# Patient Record
Sex: Male | Born: 1980 | Hispanic: No | Marital: Single | Smoking: Current every day smoker
Health system: Southern US, Community
[De-identification: ages and names within clinical notes are randomized; demographics above are authoritative.]

## PROBLEM LIST (undated history)

## (undated) DIAGNOSIS — K922 Gastrointestinal hemorrhage, unspecified: Secondary | ICD-10-CM

## (undated) DIAGNOSIS — G629 Polyneuropathy, unspecified: Secondary | ICD-10-CM

## (undated) HISTORY — PX: TONSILLECTOMY: SUR1361

---

## 2006-08-14 ENCOUNTER — Ambulatory Visit: Payer: Self-pay | Admitting: Psychiatry

## 2006-08-14 ENCOUNTER — Inpatient Hospital Stay (HOSPITAL_COMMUNITY): Admission: AD | Admit: 2006-08-14 | Discharge: 2006-08-21 | Payer: Self-pay | Admitting: Psychiatry

## 2015-06-17 ENCOUNTER — Emergency Department (HOSPITAL_COMMUNITY)
Admission: EM | Admit: 2015-06-17 | Discharge: 2015-06-17 | Discharge status: Against Medical Advice | Payer: No Typology Code available for payment source | Attending: Emergency Medicine | Admitting: Emergency Medicine

## 2015-06-17 DIAGNOSIS — F17210 Nicotine dependence, cigarettes, uncomplicated: Secondary | ICD-10-CM | POA: Insufficient documentation

## 2015-06-17 DIAGNOSIS — M549 Dorsalgia, unspecified: Secondary | ICD-10-CM | POA: Insufficient documentation

## 2015-06-20 ENCOUNTER — Emergency Department (HOSPITAL_COMMUNITY): Payer: BLUE CROSS/BLUE SHIELD

## 2015-06-20 ENCOUNTER — Emergency Department (HOSPITAL_COMMUNITY)
Admission: EM | Admit: 2015-06-20 | Discharge: 2015-06-20 | Discharge status: Against Medical Advice | Payer: No Typology Code available for payment source

## 2015-06-20 ENCOUNTER — Emergency Department (HOSPITAL_COMMUNITY)
Admission: EM | Admit: 2015-06-20 | Discharge: 2015-06-20 | Disposition: A | Discharge status: Home / Self Care | Payer: BLUE CROSS/BLUE SHIELD | Attending: Emergency Medicine | Admitting: Emergency Medicine

## 2015-06-20 ENCOUNTER — Encounter (HOSPITAL_COMMUNITY): Payer: Self-pay | Admitting: *Deleted

## 2015-06-20 DIAGNOSIS — F17210 Nicotine dependence, cigarettes, uncomplicated: Secondary | ICD-10-CM | POA: Diagnosis not present

## 2015-06-20 DIAGNOSIS — S3992XA Unspecified injury of lower back, initial encounter: Secondary | ICD-10-CM | POA: Diagnosis present

## 2015-06-20 DIAGNOSIS — R112 Nausea with vomiting, unspecified: Secondary | ICD-10-CM

## 2015-06-20 DIAGNOSIS — Z8719 Personal history of other diseases of the digestive system: Secondary | ICD-10-CM | POA: Insufficient documentation

## 2015-06-20 DIAGNOSIS — Y92410 Unspecified street and highway as the place of occurrence of the external cause: Secondary | ICD-10-CM | POA: Insufficient documentation

## 2015-06-20 DIAGNOSIS — S20229A Contusion of unspecified back wall of thorax, initial encounter: Secondary | ICD-10-CM | POA: Diagnosis not present

## 2015-06-20 DIAGNOSIS — Y9389 Activity, other specified: Secondary | ICD-10-CM | POA: Diagnosis not present

## 2015-06-20 DIAGNOSIS — Z8669 Personal history of other diseases of the nervous system and sense organs: Secondary | ICD-10-CM | POA: Insufficient documentation

## 2015-06-20 DIAGNOSIS — S0990XA Unspecified injury of head, initial encounter: Secondary | ICD-10-CM | POA: Insufficient documentation

## 2015-06-20 DIAGNOSIS — R197 Diarrhea, unspecified: Secondary | ICD-10-CM

## 2015-06-20 DIAGNOSIS — S20221A Contusion of right back wall of thorax, initial encounter: Secondary | ICD-10-CM | POA: Insufficient documentation

## 2015-06-20 DIAGNOSIS — Z791 Long term (current) use of non-steroidal anti-inflammatories (NSAID): Secondary | ICD-10-CM | POA: Insufficient documentation

## 2015-06-20 DIAGNOSIS — Y998 Other external cause status: Secondary | ICD-10-CM | POA: Diagnosis not present

## 2015-06-20 HISTORY — DX: Polyneuropathy, unspecified: G62.9

## 2015-06-20 HISTORY — DX: Gastrointestinal hemorrhage, unspecified: K92.2

## 2015-06-20 LAB — BASIC METABOLIC PANEL
ANION GAP: 10 (ref 5–15)
BUN: 9 mg/dL (ref 6–20)
CHLORIDE: 100 mmol/L — AB (ref 101–111)
CO2: 25 mmol/L (ref 22–32)
CREATININE: 0.8 mg/dL (ref 0.61–1.24)
Calcium: 9.3 mg/dL (ref 8.9–10.3)
GFR calc non Af Amer: >60 mL/min (ref >60)
Glucose, Bld: 99 mg/dL (ref 65–99)
POTASSIUM: 3.7 mmol/L (ref 3.5–5.1)
SODIUM: 135 mmol/L (ref 135–145)

## 2015-06-20 LAB — URINALYSIS, ROUTINE W REFLEX MICROSCOPIC
BILIRUBIN URINE: NEGATIVE
Glucose, UA: NEGATIVE mg/dL
HGB URINE DIPSTICK: NEGATIVE
KETONES UR: NEGATIVE mg/dL
Leukocytes, UA: NEGATIVE
NITRITE: NEGATIVE
PROTEIN: NEGATIVE mg/dL
Specific Gravity, Urine: 1.02 (ref 1.005–1.030)
pH: 6.5 (ref 5.0–8.0)

## 2015-06-20 IMAGING — CR DG LUMBAR SPINE COMPLETE 4+V
5 series · 5 of 5 positions shown · non-contrast
Comparison: None.

CLINICAL DATA: Initial evaluation for right-sided lower back pain
for 1 week.

EXAM:
LUMBAR SPINE - COMPLETE 4+ VIEW

[l-spine ap]
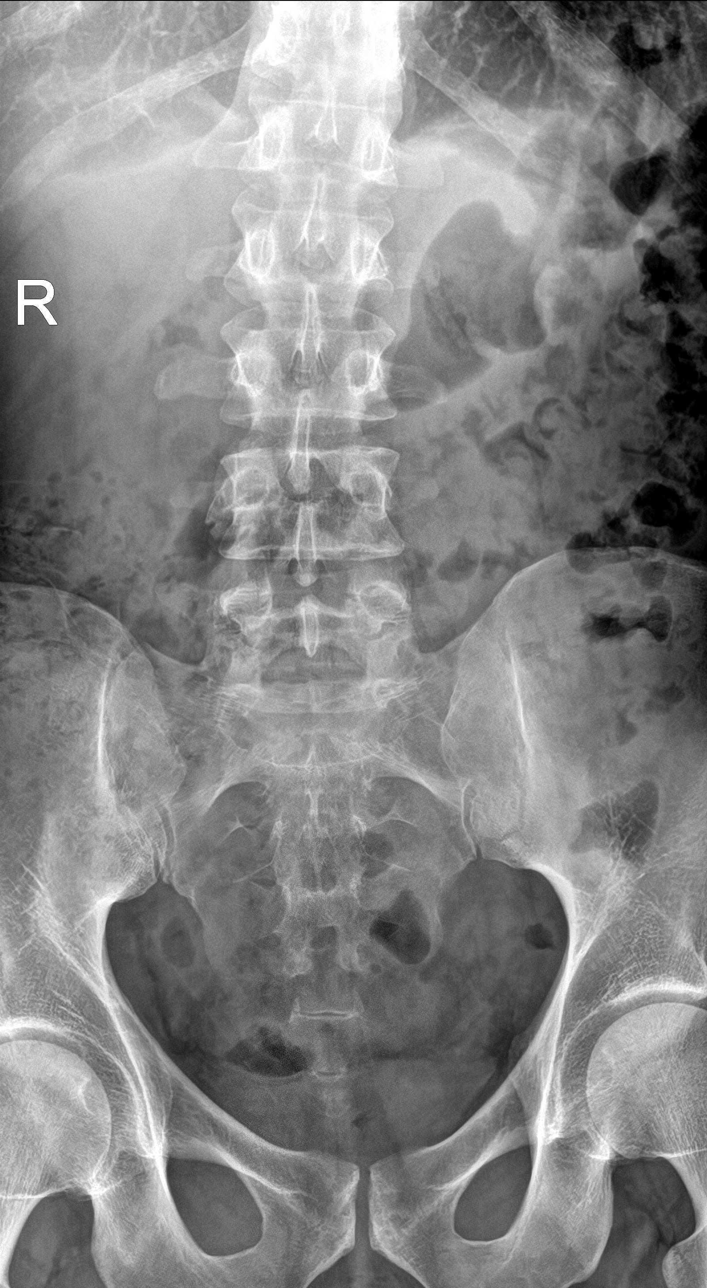

[l-spine obl (1 of 2)]
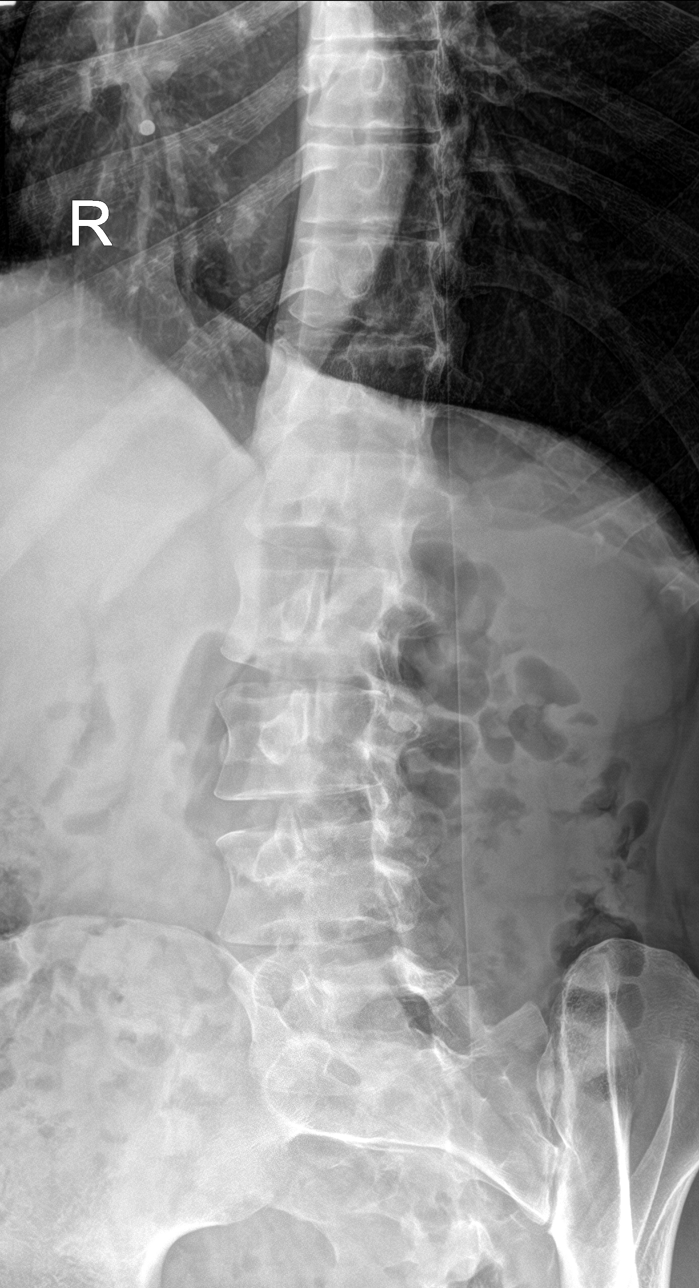

[l-spine obl (2 of 2)]
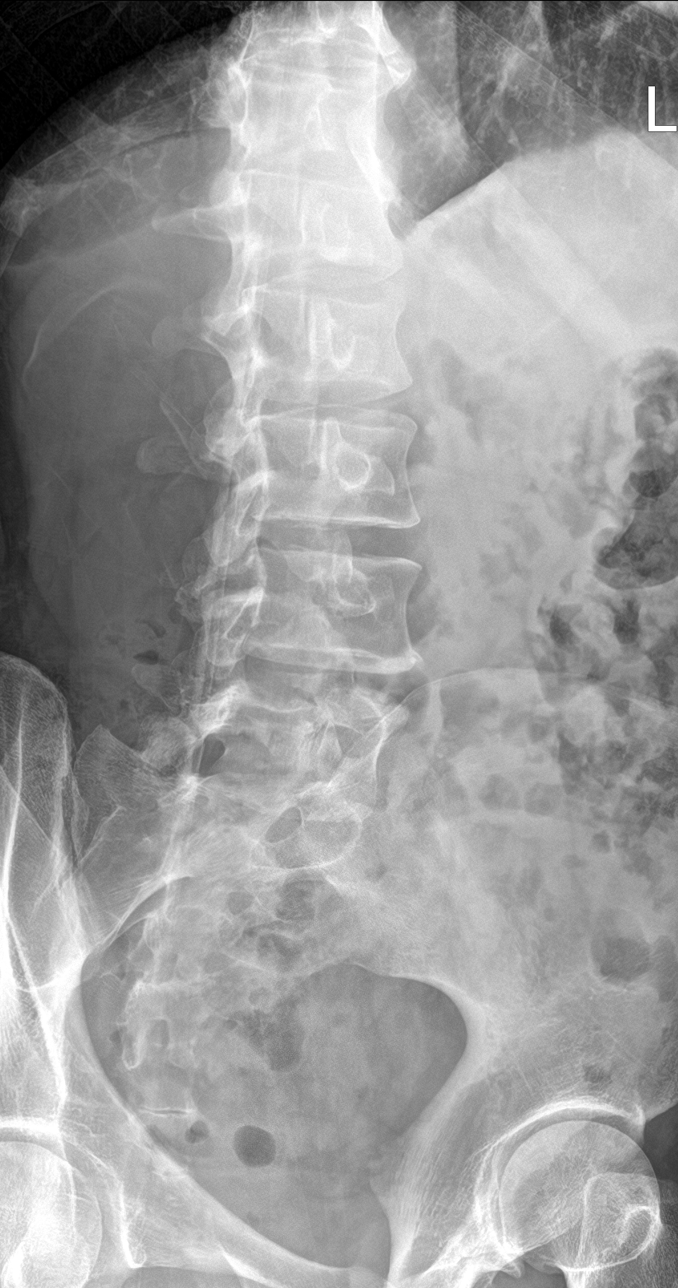

[l-spine lat]
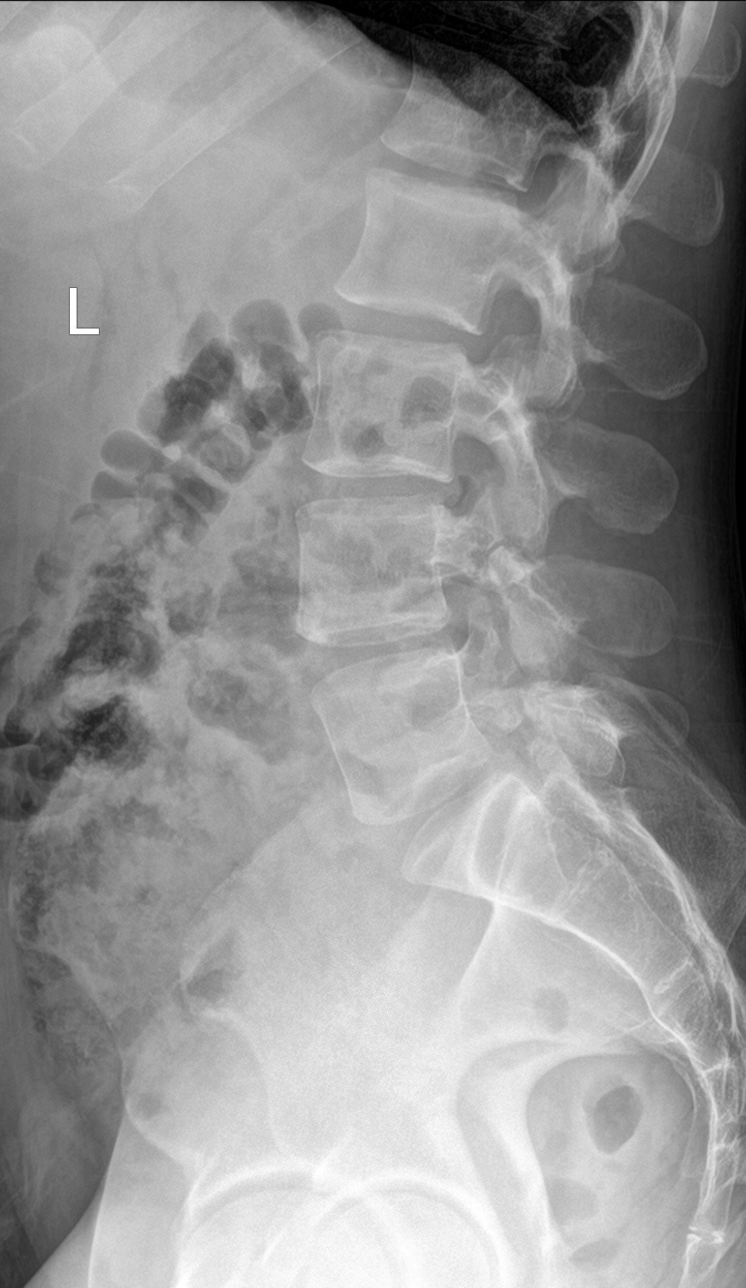

[l-spine spot]
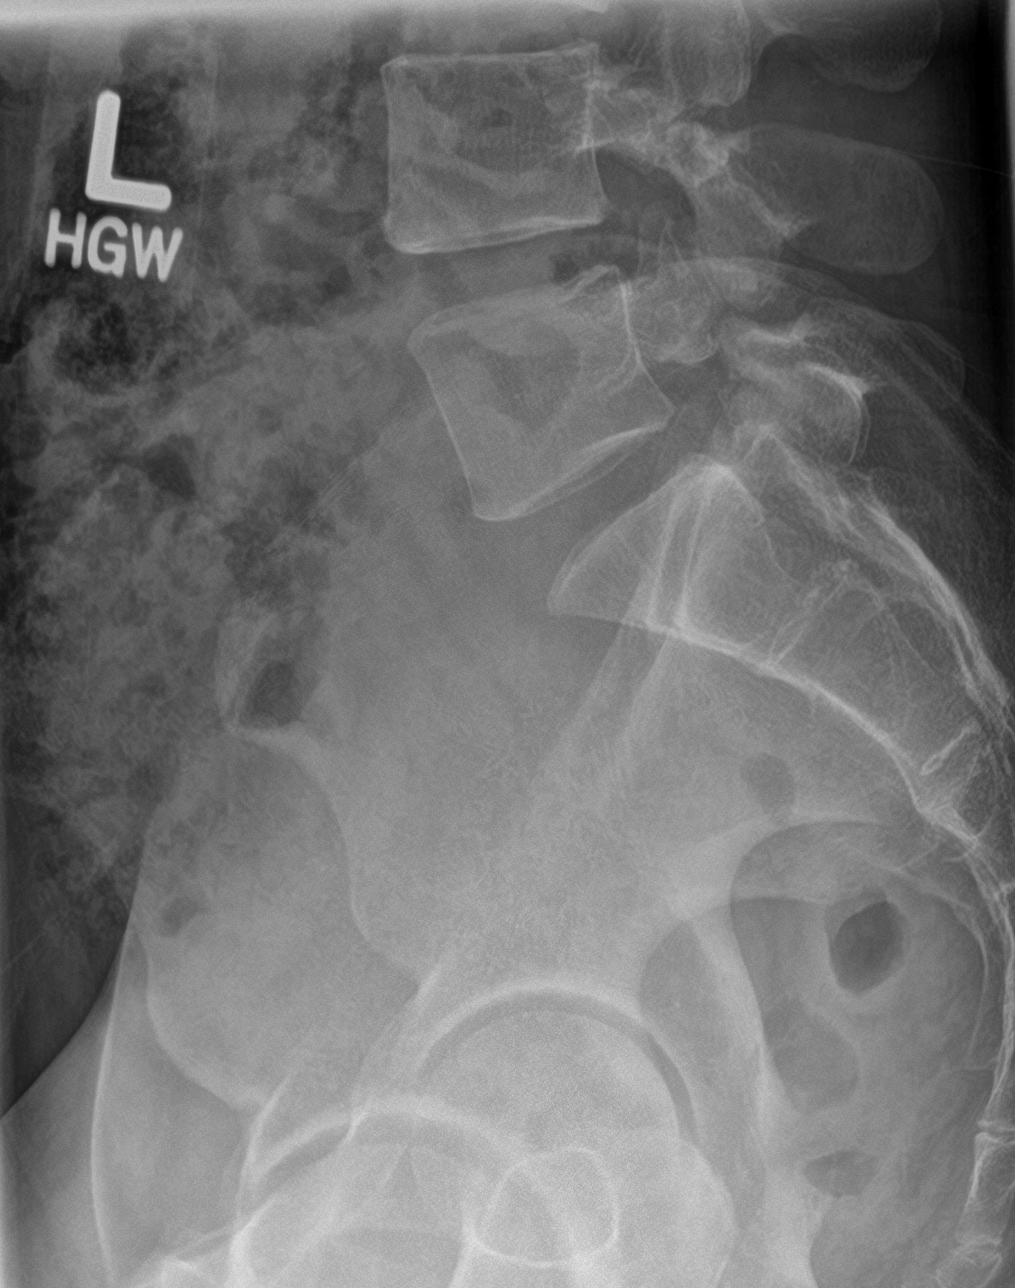

[5 of 5 positions shown; findings below may reference images not displayed]

FINDINGS: Mild dextroscoliosis. Vertebral bodies otherwise normally aligned
with preservation of the normal lumbar lordosis. There are chronic
bilateral pars defects at L5 as well as L4. No spondylolisthesis.
Minimal degenerative endplate changes at L3-4 through L5-S1. No
fracture. SI joints approximated. Soft tissues are normal.
IMPRESSION: 1. No acute abnormality within the lumbar spine.
2. Chronic bilateral pars defects at L4 and L5 without
spondylolisthesis.
3. Mild degenerative endplate changes at L3-4 through L5-S1.

## 2015-06-20 MED ORDER — ONDANSETRON HCL 4 MG PO TABS: 4.0000 mg | ORAL_TABLET | Freq: Four times a day (QID) | ORAL | Status: DC

## 2015-06-20 MED ORDER — CYCLOBENZAPRINE HCL 10 MG PO TABS: 10.0000 mg | ORAL_TABLET | Freq: Two times a day (BID) | ORAL | Status: DC | PRN

## 2015-06-20 MED ORDER — DICLOFENAC SODIUM 50 MG PO TBEC: 50.0000 mg | DELAYED_RELEASE_TABLET | Freq: Two times a day (BID) | ORAL | Status: DC

## 2015-06-20 NOTE — ED Notes (Signed)
Patient verbalized understanding of discharge instructions and denies any further needs or questions at this time. VS stable. Patient ambulatory with steady gait. Assisted to ED lobby in wheelchair.   

## 2015-06-20 NOTE — ED Notes (Signed)
Patient called, no answer.

## 2015-06-20 NOTE — Discharge Instructions (Signed)
Your x-rays tonight show no acute injury to your back. We are treating your pain. We are also treating your nausea and giving you a diet to follow up to help the diarrhea. Return as needed for any problems.

## 2015-06-20 NOTE — ED Notes (Signed)
PT checked in to [facility] he was going to park his car and get something to eat  After he checked in. Pt called in front lobby with no answer,.
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
## 2015-06-20 NOTE — ED Notes (Signed)
Patient called again with no answer.

## 2015-06-20 NOTE — ED Notes (Addendum)
Called for pt. X 2 he informed the Registration that he was going to eat and then park his car.  We suggested to pt. That he should come back after parking his car and be seen first.  We told him that we could give him something to eat after being seen.

## 2015-06-20 NOTE — ED Notes (Signed)
Pt moved to a room with a bed per providers request. Pt placed in gown and position of comfort.
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
## 2020-01-14 ENCOUNTER — Ambulatory Visit (HOSPITAL_COMMUNITY)
Admission: AD | Admit: 2020-01-14 | Discharge: 2020-01-14 | Disposition: A | Discharge status: Home / Self Care | Payer: Self-pay | Attending: Psychiatry | Admitting: Psychiatry

## 2020-01-14 DIAGNOSIS — F1920 Other psychoactive substance dependence, uncomplicated: Secondary | ICD-10-CM
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
## 2020-01-14 NOTE — BH Assessment (Signed)
Comprehensive Clinical Assessment (CCA) Screening, Triage and Referral Note  01/14/2020 George Summers [id number] Pt came in as a walk in.  He drove himself.  Pt is homeless and living out of his car with a 46 month old puppy.  Patient is wanting to detox from ETOH and heroin.  He has been drinking about a 5th per day of liquor.  Last use was yesterday evening/night.  He has been using about a gram of heroin IV daily but says that he had weaned himself down a bit over the last few days.  Last use was some residue from a spoon around 06:30 today.    Pt denies any SI, HI or A/V hallucinations.  No prior suicide attempts.  Patient says he has been in tx before in January of 2017 at [facility].  He was clean for 4 years but relapsed this past March.  Patient has no current outpatient provider.  Clinician discussed patient care with George Rakers, NP who did the MSE over teleassessment machine.  George Summers and clinician agreed on giving patient referral information for detox facilities.  The patient received this information and will make calls.  He had already called [facility] and will call them back.  Visit Diagnosis: No diagnosis found. Polysubstance dependence  Patient Reported Information How did you hear about [place]? Self ([facility] walk in)   Referral name: No data recorded  Referral phone number: No data recorded Whom do you see for routine medical problems? I don't have a doctor   Practice/Facility Name: No data recorded  Practice/Facility Phone Number: No data recorded  Name of Contact: No data recorded  Contact Number: No data recorded  Contact Fax Number: No data recorded  Prescriber Name: No data recorded  Prescriber Address (if known): No data recorded What Is the Reason for Your Visit/Call Today? No data recorded How Long Has This Been Causing You Problems? 1-6 months  Have You Recently Been in Any Inpatient Treatment (Hospital/Detox/Crisis Center/28-Day Program)? No   Name/Location of  Program/Hospital:No data recorded  How Long Were You There? No data recorded  When Were You Discharged? No data recorded Have You Ever Received Services From [facility] Before? Yes   Who Do You See at [facility]? [facility] in 2017  Have You Recently Had Any Thoughts About Hurting Yourself? No   Are You Planning to Commit Suicide/Harm Yourself At This time?  No  Have you Recently Had Thoughts About Hurting Someone George Summers? No   Explanation: No data recorded Have You Used Any Alcohol or Drugs in the Past 24 Hours? Yes   How Long Ago Did You Use Drugs or Alcohol?  1400 (14 hours ago)   What Did You Use and How Much? Drank half a fifth between last evening and last night.  Used small bit of residue of heroin IV around 0630 on 07/28  What Do You Feel Would Help You the Most Today? Assessment Only  Do You Currently Have a Therapist/Psychiatrist? No   Name of Therapist/Psychiatrist: No data recorded  Have You Been Recently Discharged From Any Office Practice or Programs? No   Explanation of Discharge From Practice/Program:  No data recorded    CCA Screening Triage Referral Assessment Type of Contact: Face-to-Face   Is this Initial or Reassessment? No data recorded  Date Telepsych consult ordered in [facility]:  No data recorded  Time Telepsych consult ordered in [facility]:  No data recorded Patient Reported Information Reviewed? Yes   Patient Left Without Being Seen? No  data recorded  Reason for Not Completing Assessment: No data recorded Collateral Involvement: No data recorded Does Patient Have a Court Appointed Legal Guardian? No data recorded  Name and Contact of Legal Guardian:  No data recorded If Minor and Not Living with Parent(s), Who has Custody? No data recorded Is CPS involved or ever been involved? No data recorded Is APS involved or ever been involved? No data recorded Patient Determined To Be At Risk for Harm To Self or Others Based on Review of Patient Reported Information or  Presenting Complaint? No   Method: No data recorded  Availability of Means: No data recorded  Intent: No data recorded  Notification Required: No data recorded  Additional Information for Danger to Others Potential:  No data recorded  Additional Comments for Danger to Others Potential:  No data recorded  Are There Guns or Other Weapons in Your Home?  No data recorded   Types of Guns/Weapons: No data recorded   Are These Weapons Safely Secured?                              No data recorded   Who Could Verify You Are Able To Have These Secured:    No data recorded Do You Have any Outstanding Charges, Pending Court Dates, Parole/Probation? No data recorded Contacted To Inform of Risk of Harm To Self or Others: No data recorded Location of Assessment: [facility] (Pt at [facility] as a walk in)  Does Patient Present under Involuntary Commitment? No   IVC Papers Initial File Date: No data recorded  [place] of Residence: [place] (Homeless in [place])  Patient Currently Receiving the Following Services: Not Receiving Services   Determination of Need: Emergent (2 hours)   Options For Referral: Therapeutic Triage Services   George Summers, LCAS

## 2020-07-26 ENCOUNTER — Inpatient Hospital Stay (HOSPITAL_COMMUNITY)
Admission: EM | Admit: 2020-07-26 | Discharge: 2020-07-27 | DRG: 540 | Discharge status: Against Medical Advice | Payer: Self-pay | Attending: Internal Medicine | Admitting: Internal Medicine

## 2020-07-26 ENCOUNTER — Other Ambulatory Visit: Payer: Self-pay

## 2020-07-26 DIAGNOSIS — G062 Extradural and subdural abscess, unspecified: Secondary | ICD-10-CM

## 2020-07-26 DIAGNOSIS — M545 Low back pain, unspecified: Secondary | ICD-10-CM

## 2020-07-26 DIAGNOSIS — M4620 Osteomyelitis of vertebra, site unspecified: Secondary | ICD-10-CM | POA: Diagnosis present

## 2020-07-26 DIAGNOSIS — L02212 Cutaneous abscess of back [any part, except buttock]: Secondary | ICD-10-CM | POA: Diagnosis present

## 2020-07-26 DIAGNOSIS — Z79899 Other long term (current) drug therapy: Secondary | ICD-10-CM

## 2020-07-26 DIAGNOSIS — Z5329 Procedure and treatment not carried out because of patient's decision for other reasons: Secondary | ICD-10-CM | POA: Diagnosis not present

## 2020-07-26 DIAGNOSIS — B9561 Methicillin susceptible Staphylococcus aureus infection as the cause of diseases classified elsewhere: Secondary | ICD-10-CM | POA: Diagnosis present

## 2020-07-26 DIAGNOSIS — F1123 Opioid dependence with withdrawal: Secondary | ICD-10-CM | POA: Diagnosis present

## 2020-07-26 DIAGNOSIS — F17210 Nicotine dependence, cigarettes, uncomplicated: Secondary | ICD-10-CM | POA: Diagnosis present

## 2020-07-26 DIAGNOSIS — Z20822 Contact with and (suspected) exposure to covid-19: Secondary | ICD-10-CM | POA: Diagnosis present

## 2020-07-26 DIAGNOSIS — M4626 Osteomyelitis of vertebra, lumbar region: Principal | ICD-10-CM | POA: Diagnosis present

## 2020-07-26 DIAGNOSIS — F419 Anxiety disorder, unspecified: Secondary | ICD-10-CM | POA: Diagnosis present

## 2020-07-26 DIAGNOSIS — M6088 Other myositis, other site: Secondary | ICD-10-CM | POA: Diagnosis present

## 2020-07-26 NOTE — ED Triage Notes (Signed)
Pt states he's passed out twice today from pain

## 2020-07-26 NOTE — ED Triage Notes (Signed)
Pt complaining of sciatic/groin pain x3 days. Rates pain 10/10, states ibuprofin has not helped pain.

## 2020-07-27 ENCOUNTER — Inpatient Hospital Stay (HOSPITAL_COMMUNITY): Payer: Self-pay

## 2020-07-27 ENCOUNTER — Emergency Department (HOSPITAL_COMMUNITY): Payer: Self-pay

## 2020-07-27 DIAGNOSIS — F1193 Opioid use, unspecified with withdrawal: Secondary | ICD-10-CM | POA: Insufficient documentation

## 2020-07-27 DIAGNOSIS — F1123 Opioid dependence with withdrawal: Secondary | ICD-10-CM

## 2020-07-27 DIAGNOSIS — M4620 Osteomyelitis of vertebra, site unspecified: Secondary | ICD-10-CM | POA: Diagnosis present

## 2020-07-27 DIAGNOSIS — R7881 Bacteremia: Secondary | ICD-10-CM

## 2020-07-27 DIAGNOSIS — G062 Extradural and subdural abscess, unspecified: Secondary | ICD-10-CM | POA: Insufficient documentation

## 2020-07-27 DIAGNOSIS — M545 Low back pain, unspecified: Secondary | ICD-10-CM | POA: Insufficient documentation

## 2020-07-27 LAB — CBC WITH DIFFERENTIAL/PLATELET
Abs Immature Granulocytes: 0.16 10*3/uL — ABNORMAL HIGH (ref 0.00–0.07)
Basophils Absolute: 0.1 10*3/uL (ref 0.0–0.1)
Basophils Relative: 1 %
Eosinophils Absolute: 0.4 10*3/uL (ref 0.0–0.5)
Eosinophils Relative: 2 %
HCT: 48.9 % (ref 39.0–52.0)
Hemoglobin: 15.8 g/dL (ref 13.0–17.0)
Immature Granulocytes: 1 %
Lymphocytes Relative: 6 %
Lymphs Abs: 1.3 10*3/uL (ref 0.7–4.0)
MCH: 29.7 pg (ref 26.0–34.0)
MCHC: 32.3 g/dL (ref 30.0–36.0)
MCV: 91.9 fL (ref 80.0–100.0)
Monocytes Absolute: 1.9 10*3/uL — ABNORMAL HIGH (ref 0.1–1.0)
Monocytes Relative: 8 %
Neutro Abs: 18.9 10*3/uL — ABNORMAL HIGH (ref 1.7–7.7)
Neutrophils Relative %: 82 %
Platelets: 358 10*3/uL (ref 150–400)
RBC: 5.32 MIL/uL (ref 4.22–5.81)
RDW: 14.9 % (ref 11.5–15.5)
WBC: 22.7 10*3/uL — ABNORMAL HIGH (ref 4.0–10.5)
nRBC: 0 % (ref 0.0–0.2)

## 2020-07-27 LAB — BASIC METABOLIC PANEL
Anion gap: 12 (ref 5–15)
BUN: 10 mg/dL (ref 6–20)
CO2: 25 mmol/L (ref 22–32)
Calcium: 9.2 mg/dL (ref 8.9–10.3)
Chloride: 100 mmol/L (ref 98–111)
Creatinine, Ser: 0.68 mg/dL (ref 0.61–1.24)
GFR, Estimated: >60 mL/min (ref >60)
Glucose, Bld: 127 mg/dL — ABNORMAL HIGH (ref 70–99)
Potassium: 4.2 mmol/L (ref 3.5–5.1)
Sodium: 137 mmol/L (ref 135–145)

## 2020-07-27 LAB — RESP PANEL BY RT-PCR (FLU A&B, COVID) ARPGX2
Influenza A by PCR: NEGATIVE
Influenza B by PCR: NEGATIVE
SARS Coronavirus 2 by RT PCR: NEGATIVE

## 2020-07-27 LAB — HEPATITIS PANEL, ACUTE
HCV Ab: NONREACTIVE
Hep A IgM: NONREACTIVE
Hep B C IgM: NONREACTIVE
Hepatitis B Surface Ag: NONREACTIVE

## 2020-07-27 LAB — PROTIME-INR
INR: 1.1 (ref 0.8–1.2)
Prothrombin Time: 13.2 seconds (ref 11.4–15.2)

## 2020-07-27 LAB — ECHOCARDIOGRAM COMPLETE
S' Lateral: 3.3 cm
Weight: 2240 oz

## 2020-07-27 LAB — LACTIC ACID, PLASMA
Lactic Acid, Venous: 0.7 mmol/L (ref 0.5–1.9)
Lactic Acid, Venous: 1 mmol/L (ref 0.5–1.9)

## 2020-07-27 LAB — RAPID HIV SCREEN (HIV 1/2 AB+AG)
HIV 1/2 Antibodies: NONREACTIVE
HIV-1 P24 Antigen - HIV24: NONREACTIVE

## 2020-07-27 LAB — SEDIMENTATION RATE: Sed Rate: 60 mm/hr — ABNORMAL HIGH (ref 0–16)

## 2020-07-27 LAB — C-REACTIVE PROTEIN: CRP: 26.4 mg/dL — ABNORMAL HIGH (ref <1.0)

## 2020-07-27 IMAGING — MR MR THORACIC SPINE W/ CM
6 of 10 series · 18 of 48 positions shown · IV contrast (gadavist)
Comparison: None.

CLINICAL DATA: Severe low back pain, IV drug user

EXAM:
MRI THORACIC AND LUMBAR SPINE WITHOUT AND WITH CONTRAST
TECHNIQUE: Multiplanar and multiecho pulse sequences of the thoracic and lumbar
spine were obtained without and with intravenous contrast.
CONTRAST:  7mL GADAVIST GADOBUTROL 1 MMOL/ML IV SOLN

[Series 33: T1 · sagittal · 3.3mm · 0.62mm/px · 2 of 9 slices shown (1 of 3)]
[im 1/9]
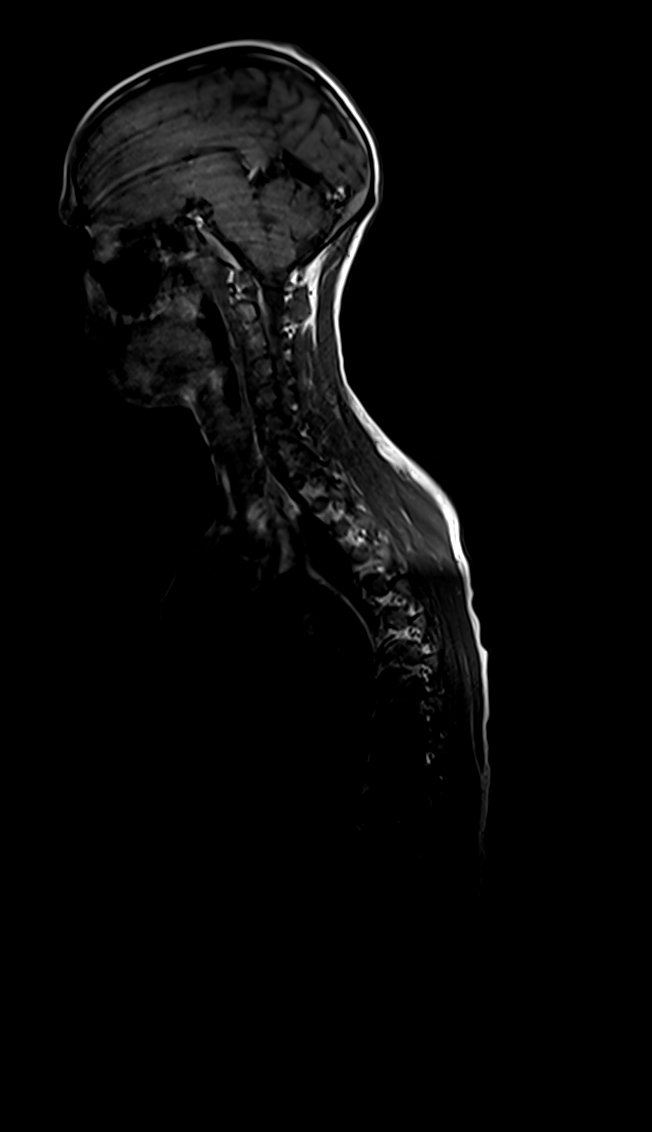
[im 9/9]
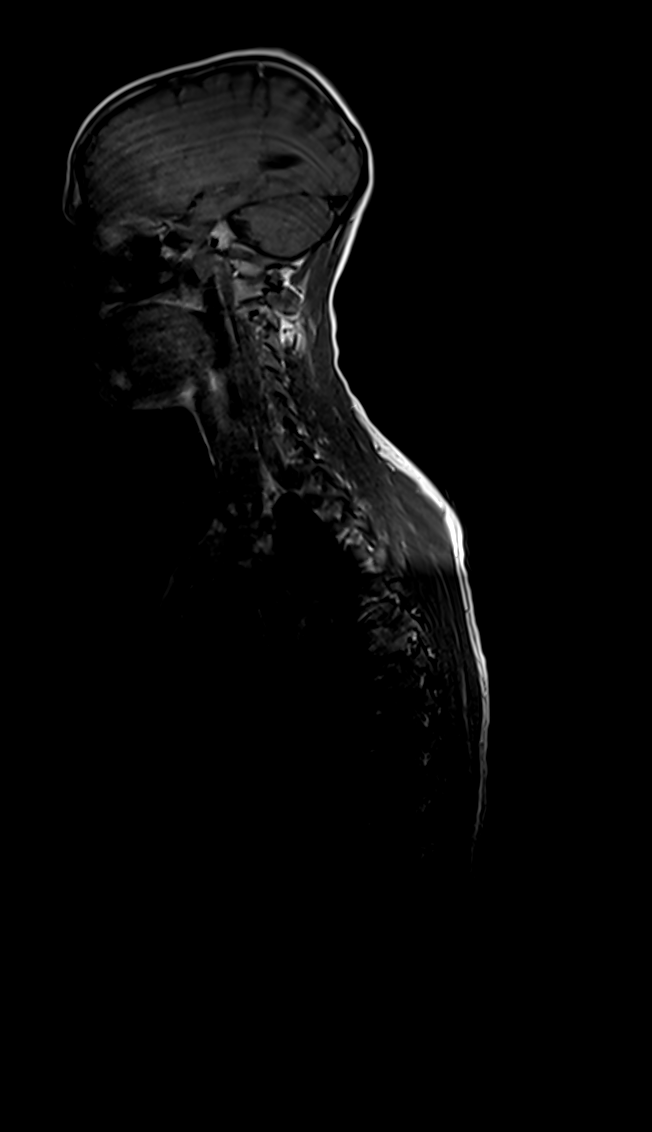

[Series 34: T2 · sagittal · 3.0mm · 0.76mm/px · 3 of 17 slices shown (1 of 2)]
[im 1/17]
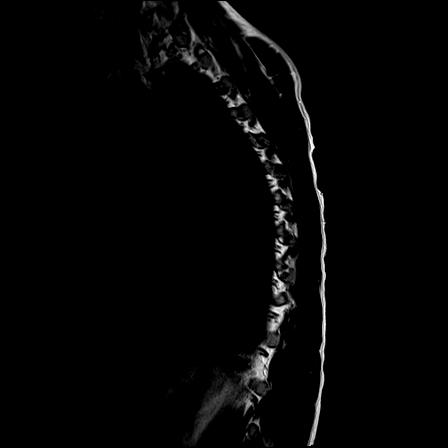
[im 9/17]
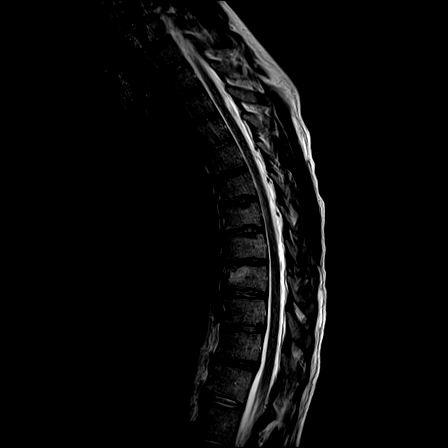
[im 17/17]
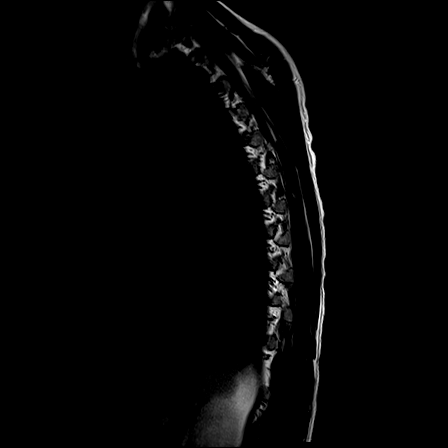

[Series 35: T1 · sagittal · 3.0mm · 0.76mm/px · 2 of 17 slices shown (2 of 3)]
[im 1/17]
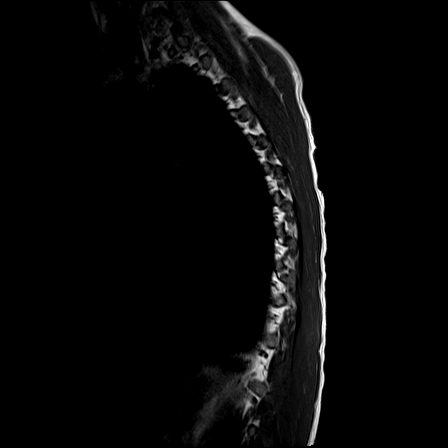
[im 17/17]
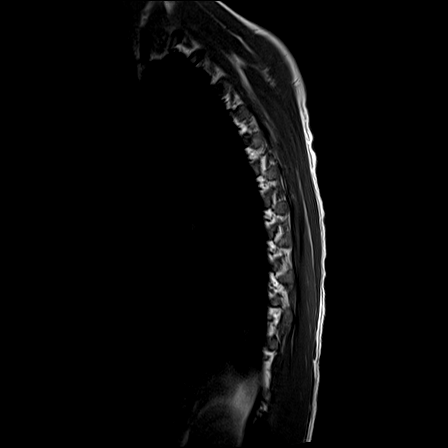

[Series 37: T2 · axial · 5.0mm · 0.59mm/px · z∈[+102,+329]mm · 5 of 39 slices shown (2 of 2)]
[im 1/39]
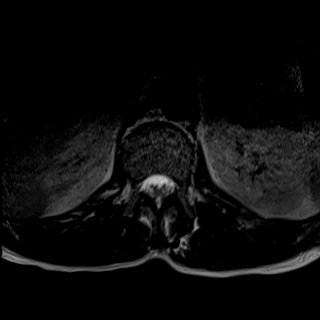
[im 10/39]
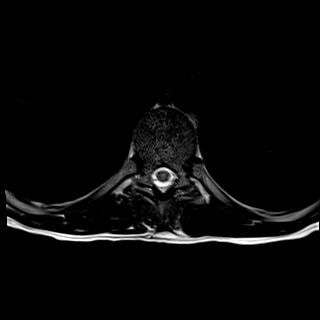
[im 20/39]
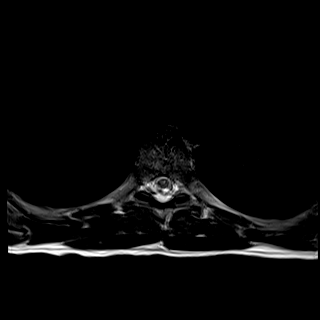
[im 29/39]
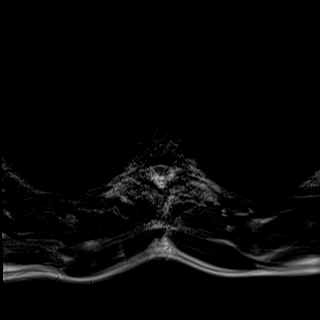
[im 39/39]
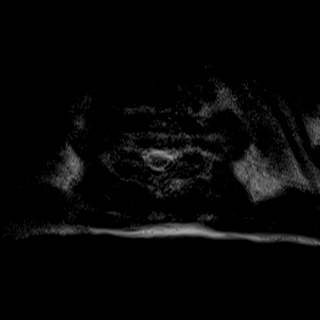

[Series 39: T1 · axial · non-contrast · 5.0mm · 0.31mm/px · z∈[+102,+329]mm · 5 of 39 slices shown (3 of 3)]
[im 1/39]
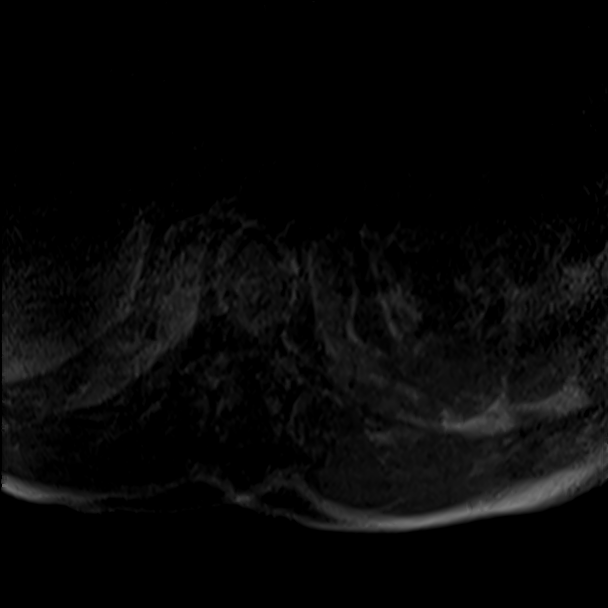
[im 10/39]
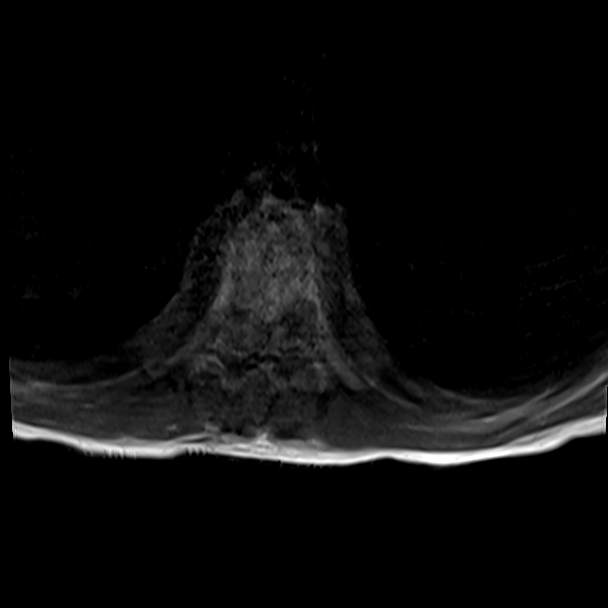
[im 20/39]
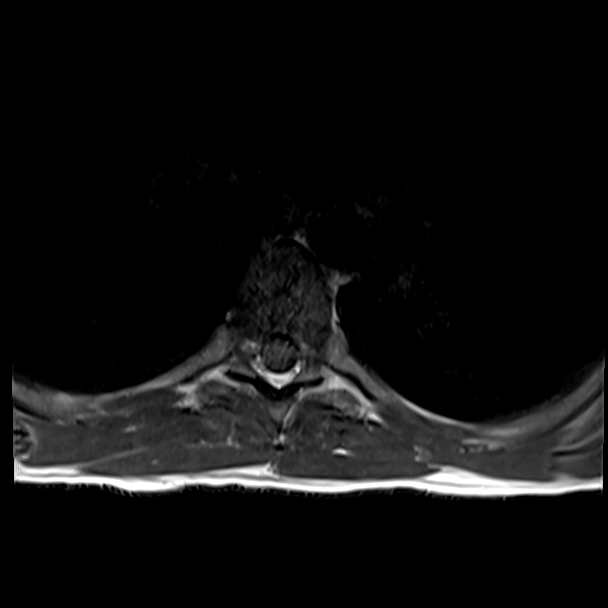
[im 29/39]
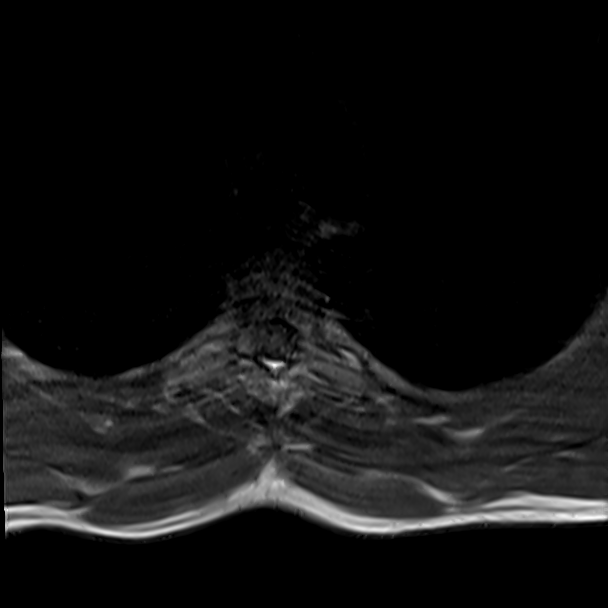
[im 39/39]
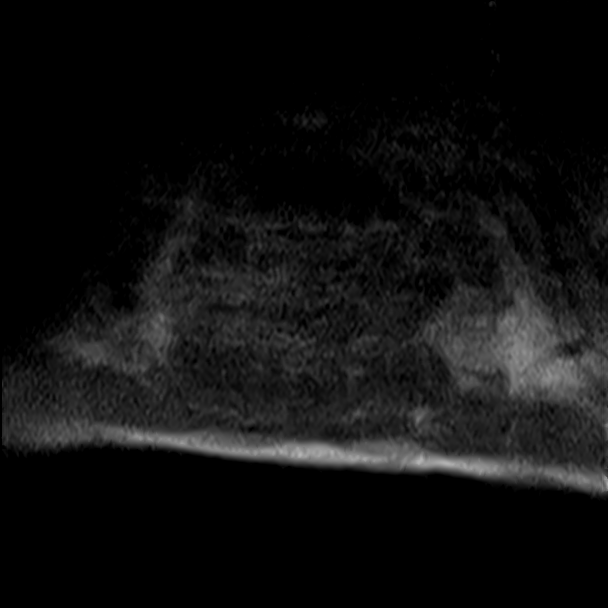

[Series 40: T1 fat-sat post-contrast · sagittal · 3.0mm · 0.76mm/px · 1 of 17 slices shown]
[im 1/17]
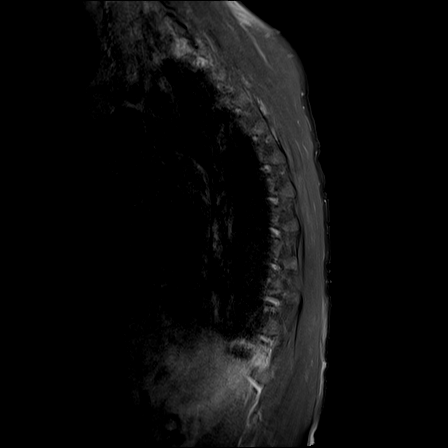

[18 of 48 positions shown; findings below may reference images not displayed]

FINDINGS: MRI THORACIC SPINE

Motion artifact is present.

Alignment: Preserved

Vertebrae: Endplate irregularity of T6-T7 through T11-T12. Vertebral
body heights are otherwise maintained. Minimal marrow edema and
enhancement underlying the T9 superior endplate.

Cord:  No abnormal signal.  No abnormal intrathecal enhancement.

Paraspinal and other soft tissues: Unremarkable.

Disc levels: There is no disc edema or abnormal enhancement. No
significant degenerative stenosis.

MRI LUMBAR SPINE

Motion artifact is present.

Segmentation: Standard.

Alignment:  Trace anterolisthesis at L4-L5.

Vertebrae: Vertebral body heights are maintained. There is minimal
endplate marrow edema at L4-L5 likely on a degenerative basis. There
is marrow edema associated with the inferior right L4 facet.

Conus medullaris: Extends to the L1 level and appears normal. No
abnormal intrathecal enhancement.

Paraspinal and other soft tissues: Paraspinal edema and enhancement
posteriorly from L4 to sacral levels. This is greatest at the L4
level where there are peripherally enhancing abscesses. For example
on the left measuring up to 2.9 cm craniocaudally (series 6, image
10). There is dorsal epidural enhancement at the L4 and L5 levels
without abscess.

Disc levels: No disc edema or enhancement. There is disc height loss
at L4-L5 and L5-S1. Disc bulges are present at these levels with
facet arthropathy. No high-grade stenosis.
IMPRESSION: Motion degraded studies.

Lower lumbar posterior paraspinal myositis with abscess formation
centered at the L4 level. Adjacent inferior right L4 facet marrow
edema likely reflects osteomyelitis. Dorsal epidural enhancement is
present at the L4 and L5 levels without abscess formation.

Minimal marrow edema and enhancement underlying the T9 superior
endplate. Favored to be on a degenerative basis. Alternatively,
could reflect early changes of infection.

## 2020-07-27 IMAGING — MR MR LUMBAR SPINE WO/W CM
4 of 7 series · 25 of 48 positions shown · IV contrast (gadavist)
Comparison: None.

CLINICAL DATA: Severe low back pain, IV drug user

EXAM:
MRI THORACIC AND LUMBAR SPINE WITHOUT AND WITH CONTRAST
TECHNIQUE: Multiplanar and multiecho pulse sequences of the thoracic and lumbar
spine were obtained without and with intravenous contrast.
CONTRAST:  7mL GADAVIST GADOBUTROL 1 MMOL/ML IV SOLN

[Series 1: T2 · sagittal · 4.0mm · 0.78mm/px · 6 of 16 slices shown (1 of 2)]
[im 1/16]
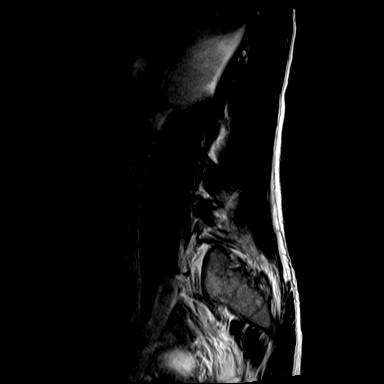
[im 4/16]
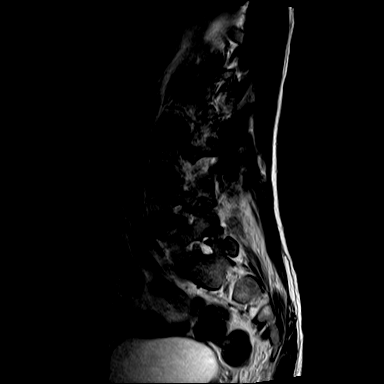
[im 7/16]
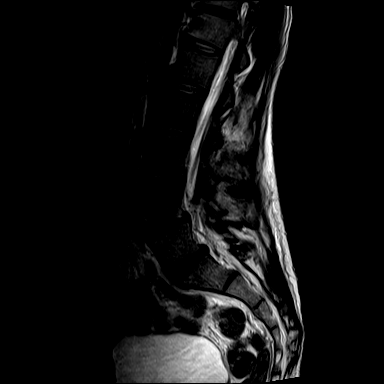
[im 10/16]
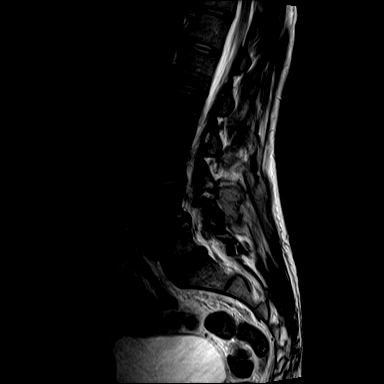
[im 13/16]
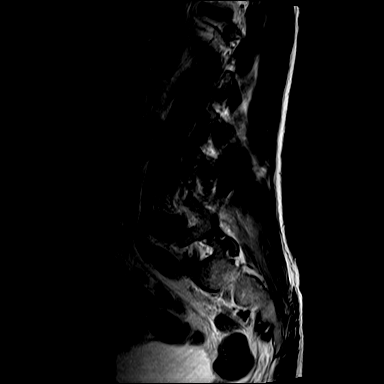
[im 16/16]
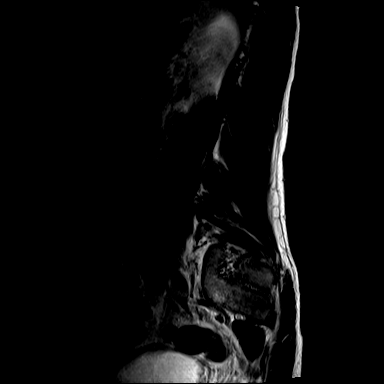

[Series 3: T1 · sagittal · 4.0mm · 0.88mm/px · 5 of 16 slices shown (1 of 2)]
[im 1/16]
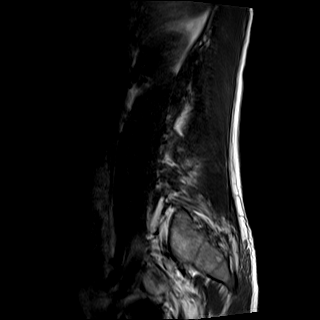
[im 4/16]
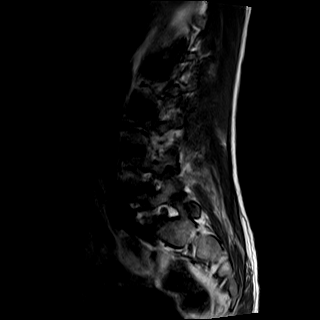
[im 8/16]
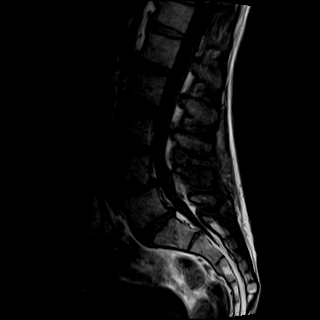
[im 12/16]
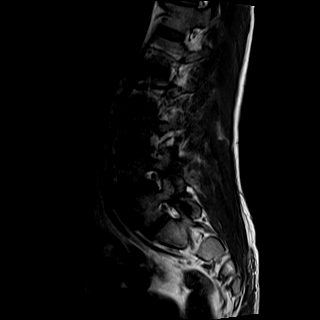
[im 16/16]
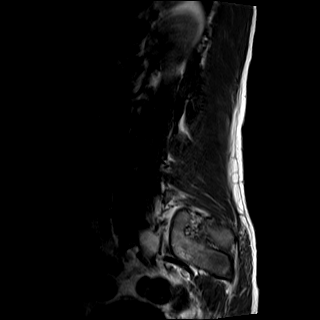

[Series 4: T2 · axial · 5.0mm · 0.57mm/px · z∈[-125,+92]mm · 9 of 30 slices shown (2 of 2)]
[im 1/30]
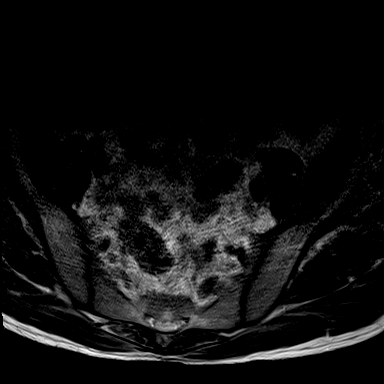
[im 4/30]
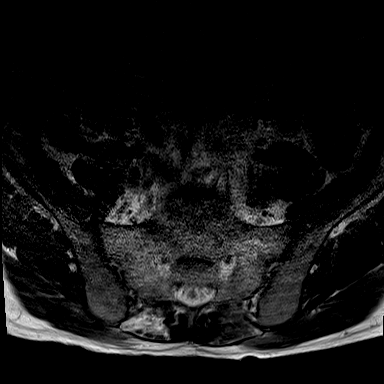
[im 8/30]
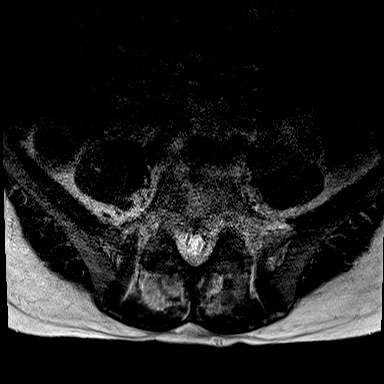
[im 11/30]
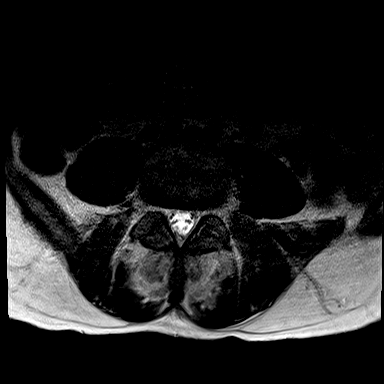
[im 15/30]
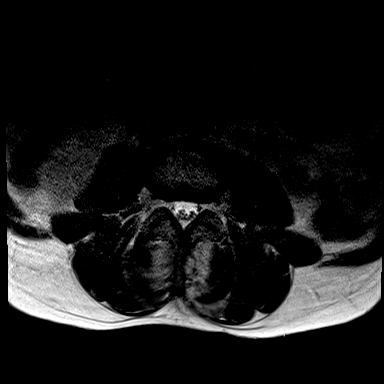
[im 19/30]
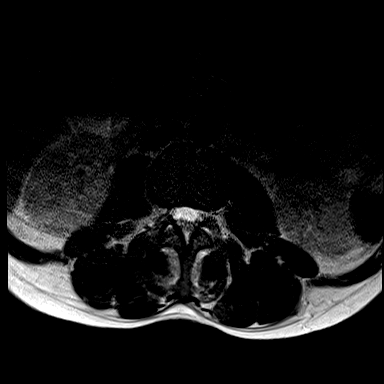
[im 22/30]
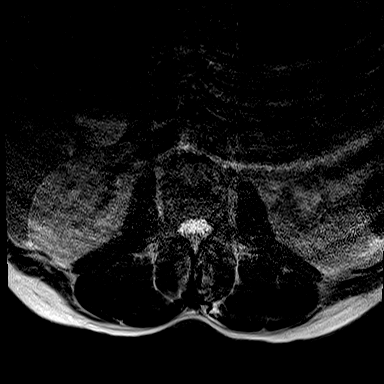
[im 26/30]
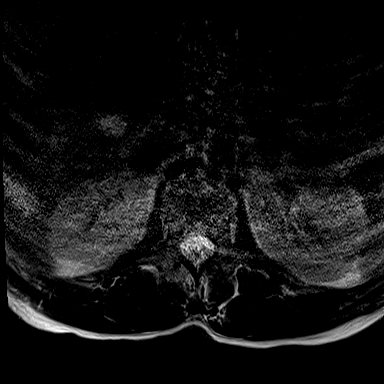
[im 30/30]
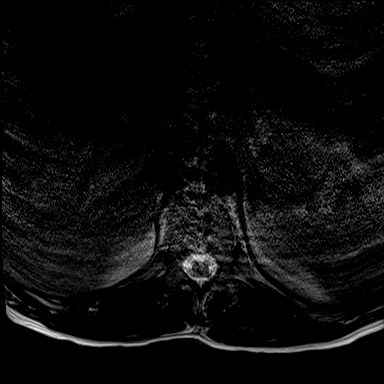

[Series 5: T1 · axial · 5.0mm · 0.34mm/px · z∈[-125,+62]mm · 5 of 30 slices shown (2 of 2)]
[im 1/30]
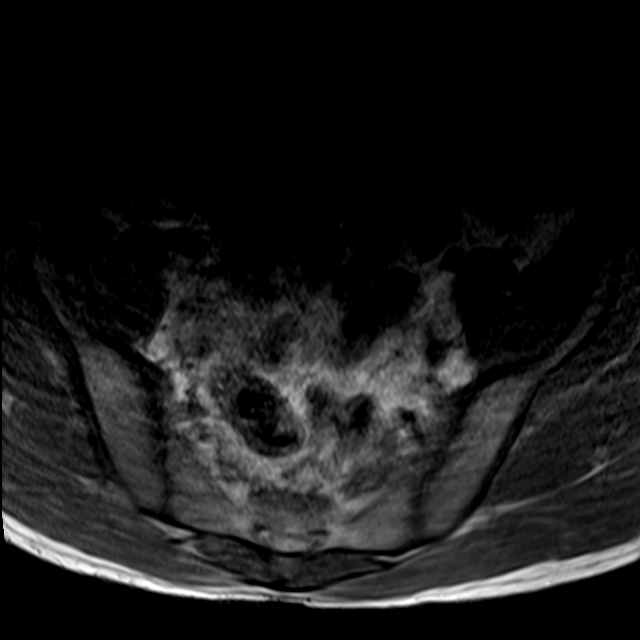
[im 4/30]
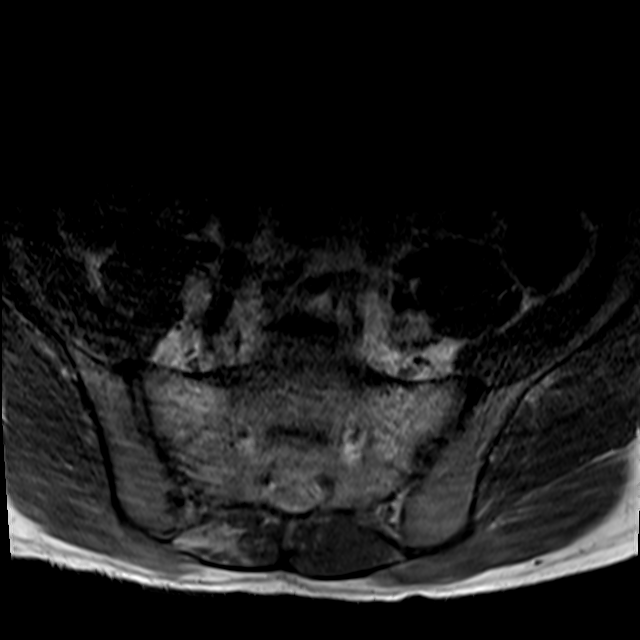
[im 8/30]
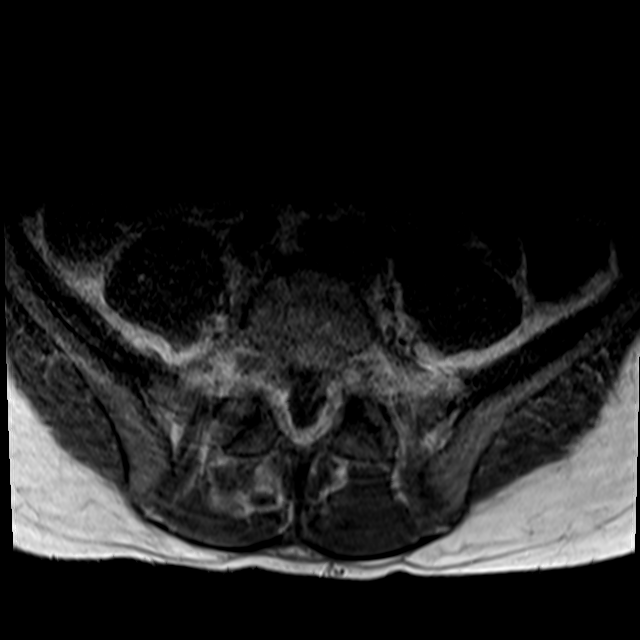
[im 15/30]
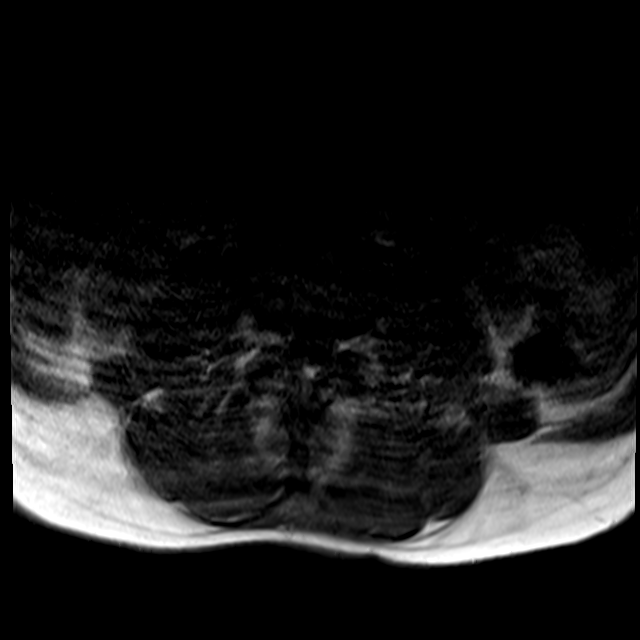
[im 26/30]
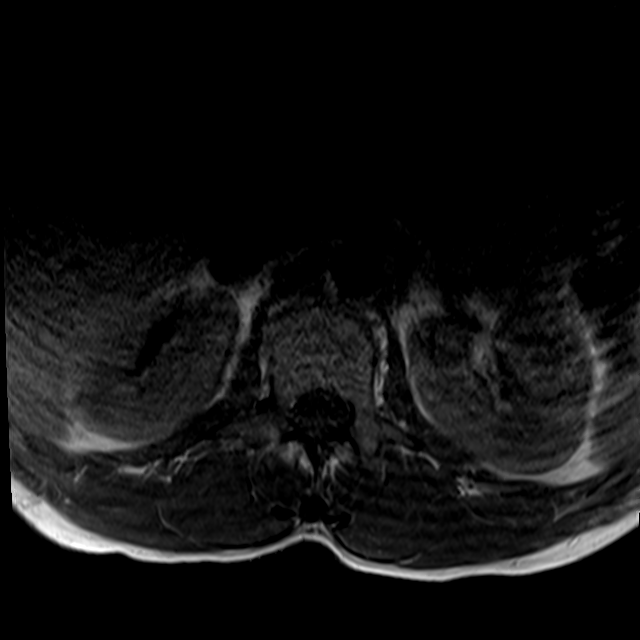

[25 of 48 positions shown; findings below may reference images not displayed]

FINDINGS: MRI THORACIC SPINE

Motion artifact is present.

Alignment: Preserved

Vertebrae: Endplate irregularity of T6-T7 through T11-T12. Vertebral
body heights are otherwise maintained. Minimal marrow edema and
enhancement underlying the T9 superior endplate.

Cord:  No abnormal signal.  No abnormal intrathecal enhancement.

Paraspinal and other soft tissues: Unremarkable.

Disc levels: There is no disc edema or abnormal enhancement. No
significant degenerative stenosis.

MRI LUMBAR SPINE

Motion artifact is present.

Segmentation: Standard.

Alignment:  Trace anterolisthesis at L4-L5.

Vertebrae: Vertebral body heights are maintained. There is minimal
endplate marrow edema at L4-L5 likely on a degenerative basis. There
is marrow edema associated with the inferior right L4 facet.

Conus medullaris: Extends to the L1 level and appears normal. No
abnormal intrathecal enhancement.

Paraspinal and other soft tissues: Paraspinal edema and enhancement
posteriorly from L4 to sacral levels. This is greatest at the L4
level where there are peripherally enhancing abscesses. For example
on the left measuring up to 2.9 cm craniocaudally (series 6, image
10). There is dorsal epidural enhancement at the L4 and L5 levels
without abscess.

Disc levels: No disc edema or enhancement. There is disc height loss
at L4-L5 and L5-S1. Disc bulges are present at these levels with
facet arthropathy. No high-grade stenosis.
IMPRESSION: Motion degraded studies.

Lower lumbar posterior paraspinal myositis with abscess formation
centered at the L4 level. Adjacent inferior right L4 facet marrow
edema likely reflects osteomyelitis. Dorsal epidural enhancement is
present at the L4 and L5 levels without abscess formation.

Minimal marrow edema and enhancement underlying the T9 superior
endplate. Favored to be on a degenerative basis. Alternatively,
could reflect early changes of infection.

## 2020-07-27 MED ORDER — SODIUM CHLORIDE 0.9 % IV SOLN
2.0000 g | INTRAVENOUS | Status: DC
  Administered 2020-07-27: 2 g via INTRAVENOUS
  Filled 2020-07-27: qty 20

## 2020-07-27 MED ORDER — HYDROMORPHONE HCL 1 MG/ML IJ SOLN
1.0000 mg | Freq: Once | INTRAMUSCULAR | Status: AC
  Administered 2020-07-27: 1 mg via INTRAVENOUS
  Filled 2020-07-27: qty 1

## 2020-07-27 MED ORDER — LORAZEPAM 2 MG/ML IJ SOLN: 1.0000 mg | Freq: Once | INTRAMUSCULAR | Status: DC | PRN

## 2020-07-27 MED ORDER — KETOROLAC TROMETHAMINE 30 MG/ML IJ SOLN: 30.0000 mg | Freq: Three times a day (TID) | INTRAMUSCULAR | Status: DC | PRN

## 2020-07-27 MED ORDER — METHADONE HCL 10 MG PO TABS
10.0000 mg | ORAL_TABLET | Freq: Once | ORAL | Status: AC
  Administered 2020-07-27: 10 mg via ORAL
  Filled 2020-07-27: qty 1

## 2020-07-27 MED ORDER — METHADONE HCL 10 MG PO TABS: 10.0000 mg | ORAL_TABLET | Freq: Every day | ORAL | Status: DC

## 2020-07-27 MED ORDER — ACETAMINOPHEN 500 MG PO TABS: 1000.0000 mg | ORAL_TABLET | Freq: Three times a day (TID) | ORAL | Status: DC

## 2020-07-27 MED ORDER — VANCOMYCIN HCL 1500 MG/300ML IV SOLN
1500.0000 mg | Freq: Once | INTRAVENOUS | Status: AC
  Administered 2020-07-27: 1500 mg via INTRAVENOUS
  Filled 2020-07-27: qty 300

## 2020-07-27 MED ORDER — SENNA 8.6 MG PO TABS: 1.0000 | ORAL_TABLET | Freq: Two times a day (BID) | ORAL | Status: DC

## 2020-07-27 MED ORDER — IBUPROFEN 400 MG PO TABS
400.0000 mg | ORAL_TABLET | Freq: Once | ORAL | Status: AC | PRN
  Administered 2020-07-27: 400 mg via ORAL
  Filled 2020-07-27: qty 1

## 2020-07-27 MED ORDER — NICOTINE 21 MG/24HR TD PT24
21.0000 mg | MEDICATED_PATCH | Freq: Every day | TRANSDERMAL | Status: DC
  Administered 2020-07-27: 21 mg via TRANSDERMAL
  Filled 2020-07-27: qty 1

## 2020-07-27 MED ORDER — ACETAMINOPHEN 325 MG PO TABS
650.0000 mg | ORAL_TABLET | Freq: Once | ORAL | Status: AC
  Administered 2020-07-27: 650 mg via ORAL
  Filled 2020-07-27: qty 2

## 2020-07-27 MED ORDER — METHOCARBAMOL 500 MG PO TABS: 500.0000 mg | ORAL_TABLET | Freq: Two times a day (BID) | ORAL | 0 refills | Status: DC

## 2020-07-27 MED ORDER — LORAZEPAM 1 MG PO TABS
1.5000 mg | ORAL_TABLET | Freq: Once | ORAL | Status: AC
  Administered 2020-07-27: 1.5 mg via ORAL
  Filled 2020-07-27: qty 2

## 2020-07-27 MED ORDER — ACETAMINOPHEN 650 MG RE SUPP: 650.0000 mg | Freq: Four times a day (QID) | RECTAL | Status: DC | PRN

## 2020-07-27 MED ORDER — ACETAMINOPHEN 325 MG PO TABS: 650.0000 mg | ORAL_TABLET | Freq: Four times a day (QID) | ORAL | Status: DC | PRN

## 2020-07-27 MED ORDER — ACETAMINOPHEN 325 MG PO TABS
650.0000 mg | ORAL_TABLET | Freq: Three times a day (TID) | ORAL | Status: DC
  Administered 2020-07-27: 650 mg via ORAL
  Filled 2020-07-27: qty 2

## 2020-07-27 MED ORDER — SENNA 8.6 MG PO TABS: 1.0000 | ORAL_TABLET | Freq: Two times a day (BID) | ORAL | Status: DC | PRN

## 2020-07-27 MED ORDER — HYDROMORPHONE HCL 2 MG PO TABS: 1.0000 mg | ORAL_TABLET | Freq: Once | ORAL | Status: DC

## 2020-07-27 MED ORDER — METHADONE HCL 10 MG PO TABS: 20.0000 mg | ORAL_TABLET | Freq: Every day | ORAL | Status: DC

## 2020-07-27 MED ORDER — NALOXONE HCL 0.4 MG/ML IJ SOLN: 0.4000 mg | INTRAMUSCULAR | Status: DC | PRN

## 2020-07-27 MED ORDER — VANCOMYCIN HCL 1000 MG/200ML IV SOLN
1000.0000 mg | Freq: Three times a day (TID) | INTRAVENOUS | Status: DC
  Filled 2020-07-27 (×2): qty 200

## 2020-07-27 MED ORDER — METHADONE HCL 10 MG PO TABS: 20.0000 mg | ORAL_TABLET | Freq: Once | ORAL | Status: DC

## 2020-07-27 MED ORDER — KETOROLAC TROMETHAMINE 60 MG/2ML IM SOLN
30.0000 mg | Freq: Once | INTRAMUSCULAR | Status: DC
Start: 2020-07-27 — End: 2020-07-27
  Filled 2020-07-27: qty 2

## 2020-07-27 MED ORDER — HYDROMORPHONE HCL 1 MG/ML IJ SOLN: 1.0000 mg | INTRAMUSCULAR | Status: DC | PRN

## 2020-07-27 MED ORDER — METHADONE HCL 10 MG PO TABS
10.0000 mg | ORAL_TABLET | Freq: Once | ORAL | Status: DC
  Filled 2020-07-27: qty 1

## 2020-07-27 MED ORDER — POLYETHYLENE GLYCOL 3350 17 G PO PACK: 17.0000 g | PACK | Freq: Every day | ORAL | Status: DC | PRN

## 2020-07-27 MED ORDER — ENOXAPARIN SODIUM 40 MG/0.4ML ~~LOC~~ SOLN: 40.0000 mg | SUBCUTANEOUS | Status: DC

## 2020-07-27 MED ORDER — GADOBUTROL 1 MMOL/ML IV SOLN
7.0000 mL | Freq: Once | INTRAVENOUS | Status: AC | PRN
  Administered 2020-07-27: 7 mL via INTRAVENOUS

## 2020-07-27 MED ORDER — SODIUM CHLORIDE 0.9 % IV BOLUS
1000.0000 mL | Freq: Once | INTRAVENOUS | Status: AC
  Administered 2020-07-27: 1000 mL via INTRAVENOUS

## 2020-07-27 NOTE — Progress Notes (Signed)
Pharmacy Antibiotic Note  George Summers is a 40 y.o. male admitted on 07/26/2020 with osteomyelitis.  Pharmacy has been consulted for vancomycin dosing.  Patient presents with 3 days of severe lower back pain found to have lumbar abscess/osteo on MRI. WBC 22.7, Tm 98.7  Plan: Vancomycin 1500mg  IV x1, followed by Vancomycin 1000mg  IV every 8 hours.  Goal trough 15-20 mcg/mL.  Predicted AUC 513 using Scr 0.68 F/u cultures, vanc levels as indicated  Weight: 63.5 kg (140 lb)  Temp (24hrs), Avg:98.5 F (36.9 C), Min:98 F (36.7 C), Max:99.3 F (37.4 C)  Recent Labs  Lab 07/27/20 0430 07/27/20 0900 07/27/20 1050  WBC 22.7*  --   --   CREATININE 0.68  --   --   LATICACIDVEN  --  0.7 1.0    CrCl cannot be calculated (Unknown ideal weight.).    No Known Allergies  Antimicrobials this admission: Ceftriaxone 2/8 >>  Vancomycin 2/8 >>   Microbiology results: 2/8 BCx: sent  Thank you for allowing pharmacy to be a part of this patient's care.  4/8, PharmD PGY-1 Acute Care Pharmacy Resident Office: [phone] 07/27/2020 11:49 AM

## 2020-07-27 NOTE — ED Notes (Signed)
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

## 2020-07-27 NOTE — Progress Notes (Signed)
Notified that pt wishes to leave AMA.   I visited with him at bedside. He says he wants to leave because "you guys keep changing things". When I asked him to explain this, he said he did not want to. I inquired into whether cravings may be playing a role in his wish to leave. He denied this. He says that he needs to leave to run some errands. He denies pain. He denies having questions regarding his care. I asked if there was anything I could do to make him more comfortable, to which he said there was not.  I explained that I do not recommend that he leave and the associated risks of doing so including worsening infection, neurologic damage, and death. He acknowledged these risks and wishes to leave anyways.   He was encouraged to undergo further evaluation and management at any point to which he wishes to do so.  Elige Radon, MD Internal Medicine Resident PGY-2 [facility] Pager: [phone] 07/27/2020 4:44 PM

## 2020-07-27 NOTE — Consult Note (Signed)
Neurosurgery Consultation  Reason for Consult: Lumbar abscess Referring Physician: Charm Barges  CC: Low back pain  HPI: This is a 40 y.o. man w/ h/o IVDU that presents with 3d of severe LBP. He denies any leg weakness, but does endorse some difficulty ambulating due to worsening of the low back pain. He denies any new numbness or parasthesias. He is not sure when he last urinated. The LBP is severe in intensity, sharp in quality, worse with movement, improved with laying still, no radiation into the BLE, located in the midline of the mid-lumbar region, no other palliating or provoking features. No recent use of anti-platelet or anti-coagulant medications.   ROS: A 14 point ROS was performed and is negative except as noted in the HPI.   PMHx:  Past Medical History:  Diagnosis Date  . GI (gastrointestinal bleed)    PT reports a stomach ulcer  . 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 SocHx:  reports that he has been smoking cigarettes. He has never used smokeless tobacco. He reports current alcohol use. He reports current drug use. Drug: Marijuana.  Exam: Vital signs in last 24 hours: Temp:  [98 F (36.7 C)-99.3 F (37.4 C)] 98.5 F (36.9 C) (02/08 0908) Pulse Rate:  [90-115] 115 (02/08 0908) Resp:  [16-20] 16 (02/08 0908) BP: (90-139)/(60-78) 111/66 (02/08 0908) SpO2:  [95 %-97 %] 97 % (02/08 0908) General: Awake, alert, cooperative, lying in bed, appears uncomfortable Head: Normocephalic HEENT: Neck supple Pulmonary: breathing room air comfortably, no evidence of increased work of breathing Cardiac: tachycardic, regular Psych: flat affect, mildly reactive Extremities: Warm and well perfused x4 Neuro: Awake/alert/interactive, pupils symmetric and midline w/ conjugate gaze, face grossly symmetric Strength 5/5 x4, SILTx4, no ankle clonus, reflexes 2+   Assessment and Plan: 40 y.o. man w/ h/o IVDU and 3d of severe LBP. MRI T/L-spine personally reviewed.  The studies are unfortunately somewhat motion degraded, but there does not appear to be any acute pathology in the thoracic spine. In the lumbar spine, there is dorsal / paraspinal enhancement with associated edema consistent with septic facet arthritis and paraspinal abscess. Lumbar dura is enhancing c/w meningeal irritation but no obvious epidural abscess.  -no acute neurosurgical intervention indicated at this time -recommend IR drainage of the paraspinal abscess if microbiology specimen needed to guide ABx use -please call with any concerns or questions  Jadene Pierini, MD 07/27/20 9:27 AM [facility]

## 2020-07-27 NOTE — H&P (Cosign Needed Addendum)
Date: 07/27/2020               Patient Name:  George Summers MRN: [id number]  DOB: 01/15/1981 Age / Sex: 40 y.o., male   PCP: Patient, No Pcp Per         Medical Service: Internal Medicine Teaching Service         Attending Physician: Dr. Earl Lagos, MD    First Contact: Dr. Elaina Pattee Pager: [phone]  Second Contact: Dr. Mcarthur Rossetti Pager: [phone]       After Hours (After 5p/  First Contact Pager: [phone]  weekends / holidays): Second Contact Pager: [phone]   Chief Complaint: back pain  History of Present Illness:   George Summers is a 40yo male with PMH of IVDU presenting with lower back pain. He is not quite sure when it started and answers some questions with mumbling and did not respond to all questions. He was rousable to voice and oriented x3 but would fall asleep or start mumbling to himself again. He mentions that he thinks he's having surgery today, but then forgot why he is here.  The pain in his back constant and has only slightly improved since coming to the hospital. It is worsened with any movement. He denies lower extremity numbness or weakness. He has no difficulty with urination or dysuria. He does state he last used heroin a couple of days ago as he was trying to come to the doctor due to his back pain. He feels sweaty, chills and like his heart is racing. No chest pain, fever, shortness of breath, nausea or cough. He denies blurring of vision or headaches.  He drinks occasionally, maybe once per week and has never had alcohol withdrawal. He uses IV heroin but does not use other drugs. He feels like he is currently going through heroin withdrawal. He does not take any medications.  He states yes and no when asked if he would like to stop using heroin.    ED Course:  HR was 104, Resp 16, bp 108/70, sats 96% on room air. WBC 24. With history of IVDU MRI was done which showed paraspinal myositis with abscess at L4 and likely osteomyelitis. Neurosurgery was consulted  and with no epidural abscess recommended IR drainage and requested to wait for antibiotic therapy. Blood cultures were drawn. He was given a 1L bolus NS and 1 mg IV dilaudid for pain.   Social:   Unable to obtain complete social history due to confusion  Uses IV heroin, does not use other drugs recreationally    Family History:   No family history per patient.   Meds:  Current Meds  Medication Sig  . ibuprofen (ADVIL) 200 MG tablet Take 1,200 mg by mouth every 6 (six) hours as needed for headache or moderate pain.  . methocarbamol (ROBAXIN) 500 MG tablet Take 1 tablet (500 mg total) by mouth 2 (two) times daily.  . naloxone (NARCAN) 2 MG/2ML injection Place 1 mL into the nose as needed. Opioid overdose.     Allergies: Allergies as of 07/26/2020  . (No Known Allergies)   Past Medical History:  Diagnosis Date  . GI (gastrointestinal bleed)    PT reports a stomach ulcer  . 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   Physical Exam: Blood pressure 112/68, pulse (!) 129, temperature 98.5 F (36.9 C), temperature source Temporal, resp. rate  16, weight 63.5 kg, SpO2 97 %.  Constitution: diaphoretic, mumbling to self, acutely ill appearing  HENT: poor dentition with multiple teeth missing, no gross abscess or infection, otherwise [name]/AT Eyes: eom intact, rotary nystagmus, +diplopia, rt pupil 38mm, lft pupil 2mm Cardio: tachycardia, regular rhythm, no m/r/g, no LE edema  Respiratory: CTA, on room air, non-labored breathing Abdominal: soft, non-distended, NTTP, normal BS  MSK: strength 5/5 symmetrical upper and lower extremities, sensation intact  Neuro: orientedx3, mumbling to self, answers some questions inappropriately and confuse, no active hallucination, CN II-XII intact  Skin: right arm with track marks and small amount of swelling    EKG: personally reviewed my interpretation is pending   MRI Thoracic and Lumbar  Spine IMPRESSION: Motion degraded studies.  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 Adjacent inferior right L4 facet marrow edema likely reflects osteomyelitis. Dorsal epidural enhancement is present at the L4 and L5 levels without abscess formation.  Minimal marrow edema and enhancement underlying the T9 superior endplate. Favored to be on a degenerative basis. Alternatively, could reflect early changes of infection. Electronically Signed   By: Guadlupe Spanish M.D.   On: 07/27/2020 08:30  Assessment & Plan by Problem: Active Problems:   Paraspinal abscess (HCC)  [name] is a 40yo male with PMH of IVDU presenting with lower back pain, found to have paraspinal abscess on MRI.   Paraspinal Abscess and Myositis L4 2/2 IVDU MRI showing paraspinal abscess and myositis. Seen by neurosurgery, no indication for surgery currently. IR consulted for possible drainage. He has no signs of lower extremity neurologic changes but does have changes in vision and rotary nystagmus with right pupil larger than left.   - f/u IR - start vanc/ceftriaxone - blood cultures pending  - MRI brain w/contrast - 1mg  ativan prn for anxiety - echo ordered - dilaudid 1mg  q2h prn, toradol q8h prn, tylenol scheduled 650 mg q8h - UDS - trend CBC    Heroin Withdrawal In active withdrawal. S/p 2mg  dilaudid IV.   - methadone 10 mg now - can give additional 10 if no improvement in symptoms, continue 10 mg daily  - dilaudid 1mg  q2h prn pain   Diet: regular  VTE: lovenox  IVF: 1L NS Code: full   Dispo: Admit patient to Inpatient with expected length of stay greater than 2 midnights.  Signed , DO 07/27/2020, 12:50 PM  Pager: [phone]

## 2020-07-27 NOTE — ED Provider Notes (Signed)
Received patient as a handoff at shift change from [facility], PA-C.  In short, patient is a current and regular IVDA heroin user who presented to the ED with 3-day history of severe low back pain and progressive weakness and difficulty ambulating.  Patient denied any incontinence, fevers, or numbness symptoms.  Symptoms uncontrolled with Tylenol and ibuprofen.  Physical exam from handoff provider was notable for severe tenderness to palpation of the midline lumbar spine.  Lower extremity strength and ambulatory function limited due to pain.  Laboratory work-up notable for leukocytosis to 22.7 and elevated inflammatory markers.  Given concern for spinal epidural abscess as patient is at high risk, MRI imaging ordered.  Ativan provided given patient's ongoing pain and anxiety regarding MRI.  Plan is for admission to neurosurgery if evidence of epidural abscess on MRI.  Otherwise, patient can be discharged home with outpatient follow-up and local resources for those with substance use disorder.  Physical Exam  BP 111/66 (BP Location: Right Arm)   Pulse (!) 115   Temp 98.5 F (36.9 C) (Temporal)   Resp 16   SpO2 97%   Physical Exam Vitals and nursing note reviewed. Exam conducted with a chaperone present.  Constitutional:      Appearance: He is ill-appearing.  HENT:     Head: Normocephalic and atraumatic.  Eyes:     General: No scleral icterus.    Conjunctiva/sclera: Conjunctivae normal.  Pulmonary:     Effort: Pulmonary effort is normal.  Skin:    General: Skin is dry.  Neurological:     Mental Status: He is alert.     GCS: GCS eye subscore is 4. GCS verbal subscore is 5. GCS motor subscore is 6.  Psychiatric:        Mood and Affect: Mood normal.        Behavior: Behavior normal.        Thought Content: Thought content normal.     ED Course/Procedures   Clinical Course as of 07/27/20 1029  Tue Jul 27, 2020  0706 WBC(!): 22.7 Patient with midline low back pain is an IV drug  user has leukocytosis of 22.7 with left shift also with elevated C-reactive protein.  Concern for spinal epidural abscess.  Patient is aware if this MRI imaging is negative for spinal epidural abscess he will be discharged with Tylenol and ibuprofen and muscle relaxer prescription. He is aware that no narcotic pain medicine will be used. [WF]  0708 BMP within normal limits [WF]  0926 I spoke with Dr. Johnsie Cancel, neurosurgery.  He suspects that this will be managed nonsurgically.  He recommends that I speak with IR about aspiration culture and then admission to hospitalist for IV antibiotics and pain control. [GG]    Clinical Course User Index [GG] Lorelee New, PA-C [WF] Gailen Shelter, [place]    .Critical Care Performed by: Lorelee New, PA-C Authorized by: Lorelee New, PA-C   Critical care provider statement:    Critical care time (minutes):  45   Critical care was necessary to treat or prevent imminent or life-threatening deterioration of the following conditions:  CNS failure or compromise   Critical care was time spent personally by me on the following activities:  Discussions with consultants, evaluation of patient's response to treatment, examination of patient, ordering and performing treatments and interventions, ordering and review of laboratory studies, ordering and review of radiographic studies, pulse oximetry, re-evaluation of patient's condition, obtaining history from patient or surrogate, review of old  charts and blood draw for specimens Comments:     Epidural abscess.   Results for orders placed or performed during the hospital encounter of 07/26/20  C-reactive protein  Result Value Ref Range   CRP 26.4 (H) <1.0 mg/dL  Sedimentation rate  Result Value Ref Range   Sed Rate 60 (H) 0 - 16 mm/hr  CBC with Differential/Platelet  Result Value Ref Range   WBC 22.7 (H) 4.0 - 10.5 K/uL   RBC 5.32 4.22 - 5.81 MIL/uL   Hemoglobin 15.8 13.0 - 17.0 g/dL   HCT 32.1  22.4 - 82.5 %   MCV 91.9 80.0 - 100.0 fL   MCH 29.7 26.0 - 34.0 pg   MCHC 32.3 30.0 - 36.0 g/dL   RDW 00.3 70.4 - 88.8 %   Platelets 358 150 - 400 K/uL   nRBC 0.0 0.0 - 0.2 %   Neutrophils Relative % 82 %   Neutro Abs 18.9 (H) 1.7 - 7.7 K/uL   Lymphocytes Relative 6 %   Lymphs Abs 1.3 0.7 - 4.0 K/uL   Monocytes Relative 8 %   Monocytes Absolute 1.9 (H) 0.1 - 1.0 K/uL   Eosinophils Relative 2 %   Eosinophils Absolute 0.4 0.0 - 0.5 K/uL   Basophils Relative 1 %   Basophils Absolute 0.1 0.0 - 0.1 K/uL   Immature Granulocytes 1 %   Abs Immature Granulocytes 0.16 (H) 0.00 - 0.07 K/uL  Basic metabolic panel  Result Value Ref Range   Sodium 137 135 - 145 mmol/L   Potassium 4.2 3.5 - 5.1 mmol/L   Chloride 100 98 - 111 mmol/L   CO2 25 22 - 32 mmol/L   Glucose, Bld 127 (H) 70 - 99 mg/dL   BUN 10 6 - 20 mg/dL   Creatinine, Ser 9.16 0.61 - 1.24 mg/dL   Calcium 9.2 8.9 - 94.5 mg/dL   GFR, Estimated >03 >88 mL/min   Anion gap 12 5 - 15  Lactic acid, plasma  Result Value Ref Range   Lactic Acid, Venous 0.7 0.5 - 1.9 mmol/L   MR THORACIC SPINE W CONTRAST  Result Date: 07/27/2020 CLINICAL DATA:  Severe low back pain, IV drug user EXAM: MRI THORACIC AND LUMBAR SPINE WITHOUT AND WITH CONTRAST TECHNIQUE: Multiplanar and multiecho pulse sequences of the thoracic and lumbar spine were obtained without and with intravenous contrast. CONTRAST:  4mL GADAVIST GADOBUTROL 1 MMOL/ML IV SOLN COMPARISON:  None. FINDINGS: MRI THORACIC SPINE Motion artifact is present. Alignment: Preserved Vertebrae: Endplate irregularity of T6-T7 through T11-T12. Vertebral body heights are otherwise maintained. Minimal marrow edema and enhancement underlying the T9 superior endplate. Cord:  No abnormal signal.  No abnormal intrathecal enhancement. Paraspinal and other soft tissues: Unremarkable. Disc levels: There is no disc edema or abnormal enhancement. No significant degenerative stenosis. MRI LUMBAR SPINE Motion artifact is  present. Segmentation: Standard. Alignment:  Trace anterolisthesis at L4-L5. Vertebrae: Vertebral body heights are maintained. There is minimal endplate marrow edema at L4-L5 likely on a degenerative basis. There is marrow edema associated with the inferior right L4 facet. Conus medullaris: Extends to the L1 level and appears normal. No abnormal intrathecal enhancement. Paraspinal and other soft tissues: Paraspinal edema and enhancement posteriorly from L4 to sacral levels. This is greatest at the L4 level where there are peripherally enhancing abscesses. For example on the left measuring up to 2.9 cm craniocaudally (series 6, image 10). There is dorsal epidural enhancement at the L4 and L5 levels without abscess. Disc levels: No  disc edema or enhancement. There is disc height loss at L4-L5 and L5-S1. Disc bulges are present at these levels with facet arthropathy. No high-grade stenosis. IMPRESSION: Motion degraded studies. 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 Adjacent inferior right L4 facet marrow edema likely reflects osteomyelitis. Dorsal epidural enhancement is present at the L4 and L5 levels without abscess formation. Minimal marrow edema and enhancement underlying the T9 superior endplate. Favored to be on a degenerative basis. Alternatively, could reflect early changes of infection. Electronically Signed   By: Guadlupe Spanish M.D.   On: 07/27/2020 08:30   MR Lumbar Spine W Wo Contrast  Result Date: 07/27/2020 CLINICAL DATA:  Severe low back pain, IV drug user EXAM: MRI THORACIC AND LUMBAR SPINE WITHOUT AND WITH CONTRAST TECHNIQUE: Multiplanar and multiecho pulse sequences of the thoracic and lumbar spine were obtained without and with intravenous contrast. CONTRAST:  39mL GADAVIST GADOBUTROL 1 MMOL/ML IV SOLN COMPARISON:  None. FINDINGS: MRI THORACIC SPINE Motion artifact is present. Alignment: Preserved Vertebrae: Endplate irregularity of T6-T7 through  T11-T12. Vertebral body heights are otherwise maintained. Minimal marrow edema and enhancement underlying the T9 superior endplate. Cord:  No abnormal signal.  No abnormal intrathecal enhancement. Paraspinal and other soft tissues: Unremarkable. Disc levels: There is no disc edema or abnormal enhancement. No significant degenerative stenosis. MRI LUMBAR SPINE Motion artifact is present. Segmentation: Standard. Alignment:  Trace anterolisthesis at L4-L5. Vertebrae: Vertebral body heights are maintained. There is minimal endplate marrow edema at L4-L5 likely on a degenerative basis. There is marrow edema associated with the inferior right L4 facet. Conus medullaris: Extends to the L1 level and appears normal. No abnormal intrathecal enhancement. Paraspinal and other soft tissues: Paraspinal edema and enhancement posteriorly from L4 to sacral levels. This is greatest at the L4 level where there are peripherally enhancing abscesses. For example on the left measuring up to 2.9 cm craniocaudally (series 6, image 10). There is dorsal epidural enhancement at the L4 and L5 levels without abscess. Disc levels: No disc edema or enhancement. There is disc height loss at L4-L5 and L5-S1. Disc bulges are present at these levels with facet arthropathy. No high-grade stenosis. IMPRESSION: Motion degraded studies. 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 Adjacent inferior right L4 facet marrow edema likely reflects osteomyelitis. Dorsal epidural enhancement is present at the L4 and L5 levels without abscess formation. Minimal marrow edema and enhancement underlying the T9 superior endplate. Favored to be on a degenerative basis. Alternatively, could reflect early changes of infection. Electronically Signed   By: Guadlupe Spanish M.D.   On: 07/27/2020 08:30    MDM   MRI lumbar spine is personally reviewed and demonstrates evidence of lower lumbar paraspinal myositis and abscess  formation centered at L4 level.  Will obtain blood cultures and lactic acid and then consult neurosurgery for admission.  On my examination, patient reports that he uses approximately 1 g of heroin per day.  He states that he has tried to get established with outpatient programs, without success.  He states that he is about ready to leave if he does not receive adequate pain control.  He understands that MRI now shows an epidural abscess and that he will require admission to the hospital and possible surgical intervention for drainage.  He understands that this is a severe, life-threatening diagnosis.  However, he also voiced that he has been depressed recently and that he might be better off going  home and dying.  Patient states that he has been having chills over the course of the past week and progressively worsening lower lumbar region pain symptoms, worse with exertion and ambulation.  Denying any focal deficits or urinary/bowel symptoms.  We will provide patient with pain control and obtain 2-hour COVID-19 test.  Will consult neurosurgery for admission.    I spoke with Dr. Johnsie Cancel, neurosurgery.  He suspects that this will be managed nonsurgically.  He recommends that I speak with IR about culture aspiration and drainage if nothing grows from blood cultures and then admission to hospitalist for IV antibiotics and pain control.    I spoke with Dr. Lowella Dandy, IR, he states that he would not be the one to aspirate an epidural abscess, but asked that I simply put in the orders for IR guided intervention.  He will follow-up on it.    I spoke with Dr. Cleaster Corin, internal medicine, who will see and admit patient.    Lorelee New, PA-C 07/27/20 1029    Terrilee Files, MD 07/27/20 [phone]

## 2020-07-27 NOTE — Progress Notes (Signed)
Patient ID: George Summers, male   DOB: 11-22-1980, 40 y.o.   MRN: [id number] Due to heavy IR schedule today, pt's CT guided asp of L4 paraspinous fluid collection will be performed on 2/9

## 2020-07-27 NOTE — Consult Note (Signed)
Chief Complaint: Patient was seen in consultation today for CT guided L4 posterior paraspinous fluid collection aspiration Chief Complaint  Patient presents with  . Back Pain  . Sciatica     Referring Physician(s): Green,G  Supervising Physician: Richarda Overlie  Patient Status: [facility] - ED  History of Present Illness: George Summers is a 40 y.o. male with hx IV drug abuse(heroin) who  presented to the [facility] ED on 2/7 with 3-day history of severe low back pain and progressive weakness and difficulty ambulating.  Patient denied any incontinence, fevers, or numbness . Symptoms were uncontrolled with Tylenol and ibuprofen.  Lower extremity strength and ambulatory function limited due to pain.  Laboratory work-up notable for leukocytosis to 22.7 and elevated inflammatory markers. 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 Adjacent inferior right L4 facet marrow edema likely reflects osteomyelitis. Dorsal epidural enhancement is present at the L4 and L5 levels without abscess formation.  Minimal marrow edema and enhancement underlying the T9 superior endplate. Favored to be on a degenerative basis. Alternatively, could reflect early changes of infection.   He is COVID 19 neg; temp 99; tachy with low back pain with rad to LE; blood cx pend; request received for image guided aspiration of L4 post paraspinous fluid collection/abscess   Past Medical History:  Diagnosis Date  . GI (gastrointestinal bleed)    PT reports a stomach ulcer  . 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 TONSILLECTOMY      Allergies: Patient has no known allergies.  Medications: Prior to Admission medications   Medication Sig Start Date End Date Taking? Authorizing Provider  ibuprofen (ADVIL) 200 MG tablet Take 1,200 mg by mouth every 6 (six) hours as needed for headache or  moderate pain.   Yes [provider]  methocarbamol (ROBAXIN) 500 MG tablet Take 1 tablet (500 mg total) by mouth 2 (two) times daily. 07/27/20  Yes Fondaw, Rodrigo Ran, PA  naloxone (NARCAN) 2 MG/2ML injection Place 1 mL into the nose as needed. Opioid overdose. 09/22/19  Yes [provider]     No family history on file.  Social History   Socioeconomic History  . Marital status: Single    Spouse name: Not on file  . Number of children: Not on file  . Years of education: Not on file  . Highest education level: Not on file  Occupational History  . Not on file  Tobacco Use  . Smoking status: Current Every Day Smoker    Types: Cigarettes  . Smokeless tobacco: Never Used  Substance and Sexual Activity  . Alcohol use: Yes    Comment: Pt drank last night.  . Drug use: Yes    Types: Marijuana    Comment: Last smoke was last night 06-20-15  . Sexual activity: Not on file  Other Topics Concern  . Not on file  Social History Narrative  . Not on file   Social Determinants of Health   Financial Resource Strain: Not on file  Food Insecurity: Not on file  Transportation Needs: Not on file  Physical Activity: Not on file  Stress: Not on file  Social Connections: Not on file     Review of Systems has occ HA's, back/LE pain, occ chill; denies CP, worsening dyspnea, cough, abd pain,N/V  Vital Signs: BP 112/68   Pulse (!) 129   Temp 98.5  F (36.9 C) (Temporal)   Resp 16   Wt 140 lb (63.5 kg)   SpO2 97%   BMI 20.09 kg/m   Physical Exam awake/answering questions ok; chest-  CTA bilat; heart- tachy but reg; abd- soft,+BS,NT; no LE edema  Imaging: MR THORACIC SPINE W CONTRAST  Result Date: 07/27/2020 CLINICAL DATA:  Severe low back pain, IV drug user EXAM: MRI THORACIC AND LUMBAR SPINE WITHOUT AND WITH CONTRAST TECHNIQUE: Multiplanar and multiecho pulse sequences of the thoracic and lumbar spine were obtained without and with intravenous contrast. CONTRAST:  90mL  GADAVIST GADOBUTROL 1 MMOL/ML IV SOLN COMPARISON:  None. FINDINGS: MRI THORACIC SPINE Motion artifact is present. Alignment: Preserved Vertebrae: Endplate irregularity of T6-T7 through T11-T12. Vertebral body heights are otherwise maintained. Minimal marrow edema and enhancement underlying the T9 superior endplate. Cord:  No abnormal signal.  No abnormal intrathecal enhancement. Paraspinal and other soft tissues: Unremarkable. Disc levels: There is no disc edema or abnormal enhancement. No significant degenerative stenosis. MRI LUMBAR SPINE Motion artifact is present. Segmentation: Standard. Alignment:  Trace anterolisthesis at L4-L5. Vertebrae: Vertebral body heights are maintained. There is minimal endplate marrow edema at L4-L5 likely on a degenerative basis. There is marrow edema associated with the inferior right L4 facet. Conus medullaris: Extends to the L1 level and appears normal. No abnormal intrathecal enhancement. Paraspinal and other soft tissues: Paraspinal edema and enhancement posteriorly from L4 to sacral levels. This is greatest at the L4 level where there are peripherally enhancing abscesses. For example on the left measuring up to 2.9 cm craniocaudally (series 6, image 10). There is dorsal epidural enhancement at the L4 and L5 levels without abscess. Disc levels: No disc edema or enhancement. There is disc height loss at L4-L5 and L5-S1. Disc bulges are present at these levels with facet arthropathy. No high-grade stenosis. IMPRESSION: Motion degraded studies. 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 Adjacent inferior right L4 facet marrow edema likely reflects osteomyelitis. Dorsal epidural enhancement is present at the L4 and L5 levels without abscess formation. Minimal marrow edema and enhancement underlying the T9 superior endplate. Favored to be on a degenerative basis. Alternatively, could reflect early changes of infection. Electronically  Signed   By: Guadlupe Spanish M.D.   On: 07/27/2020 08:30   MR Lumbar Spine W Wo Contrast  Result Date: 07/27/2020 CLINICAL DATA:  Severe low back pain, IV drug user EXAM: MRI THORACIC AND LUMBAR SPINE WITHOUT AND WITH CONTRAST TECHNIQUE: Multiplanar and multiecho pulse sequences of the thoracic and lumbar spine were obtained without and with intravenous contrast. CONTRAST:  45mL GADAVIST GADOBUTROL 1 MMOL/ML IV SOLN COMPARISON:  None. FINDINGS: MRI THORACIC SPINE Motion artifact is present. Alignment: Preserved Vertebrae: Endplate irregularity of T6-T7 through T11-T12. Vertebral body heights are otherwise maintained. Minimal marrow edema and enhancement underlying the T9 superior endplate. Cord:  No abnormal signal.  No abnormal intrathecal enhancement. Paraspinal and other soft tissues: Unremarkable. Disc levels: There is no disc edema or abnormal enhancement. No significant degenerative stenosis. MRI LUMBAR SPINE Motion artifact is present. Segmentation: Standard. Alignment:  Trace anterolisthesis at L4-L5. Vertebrae: Vertebral body heights are maintained. There is minimal endplate marrow edema at L4-L5 likely on a degenerative basis. There is marrow edema associated with the inferior right L4 facet. Conus medullaris: Extends to the L1 level and appears normal. No abnormal intrathecal enhancement. Paraspinal and other soft tissues: Paraspinal edema and enhancement posteriorly from L4 to sacral levels. This is greatest at the L4  level where there are peripherally enhancing abscesses. For example on the left measuring up to 2.9 cm craniocaudally (series 6, image 10). There is dorsal epidural enhancement at the L4 and L5 levels without abscess. Disc levels: No disc edema or enhancement. There is disc height loss at L4-L5 and L5-S1. Disc bulges are present at these levels with facet arthropathy. No high-grade stenosis. IMPRESSION: Motion degraded studies. Lower lumbar posterior paraspinal myositis with abscess  formation centered at the L4 level. Adjacent inferior right L4 facet marrow edema likely reflects osteomyelitis. Dorsal epidural enhancement is present at the L4 and L5 levels without abscess formation. Minimal marrow edema and enhancement underlying the T9 superior endplate. Favored to be on a degenerative basis. Alternatively, could reflect early changes of infection. Electronically Signed   By: Guadlupe Spanish M.D.   On: 07/27/2020 08:30    Labs:  CBC: Recent Labs    07/27/20 0430  WBC 22.7*  HGB 15.8  HCT 48.9  PLT 358    COAGS: No results for input(s): INR, APTT in the last 8760 hours.  BMP: Recent Labs    07/27/20 0430  NA 137  K 4.2  CL 100  CO2 25  GLUCOSE 127*  BUN 10  CALCIUM 9.2  CREATININE 0.68  GFRNONAA >60    LIVER FUNCTION TESTS: No results for input(s): BILITOT, AST, ALT, ALKPHOS, PROT, ALBUMIN in the last 8760 hours.  TUMOR MARKERS: No results for input(s): AFPTM, CEA, CA199, CHROMGRNA in the last 8760 hours.  Assessment and Plan: 40 y.o. male with hx IV drug abuse(heroin) who  presented to the [facility] ED on 2/7 with 3-day history of severe low back pain and progressive weakness and difficulty ambulating.  Patient denied any incontinence, fevers, or numbness . Symptoms were uncontrolled with Tylenol and ibuprofen.  Lower extremity strength and ambulatory function limited due to pain.  Laboratory work-up notable for leukocytosis to 22.7 and elevated inflammatory markers. 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 Adjacent inferior right L4 facet marrow edema likely reflects osteomyelitis. Dorsal epidural enhancement is present at the L4 and L5 levels without abscess formation.  Minimal marrow edema and enhancement underlying the T9 superior endplate. Favored to be on a degenerative basis. Alternatively, could reflect early changes of infection.   He is COVID 19 neg; temp  99; tachy with low back pain with rad to LE; blood cx pend; request received for image guided aspiration of L4 post paraspinous fluid collection/abscess. Images were reviewed by Dr. Lowella Dandy. Risks and benefits of procedure was discussed with the patient  including, but not limited to bleeding, infection, damage to adjacent structures or low yield requiring additional tests.  All of the questions were answered and there is agreement to proceed.  Consent signed   Procedure tent scheduled for later this afternoon or tomorrow (schedule allowing)    Thank you for this interesting consult.  I greatly enjoyed meeting George Summers and look forward to participating in their care.  A copy of this report was sent to the requesting provider on this date.  Electronically Signed: D. Jeananne Rama, PA-C 07/27/2020, 1:01 PM  I spent a total of  25 minutes   in face to face in clinical consultation, greater than 50% of which was counseling/coordinating care for CT guided aspiration of L4 paraspinous fluid collection

## 2020-07-27 NOTE — ED Notes (Signed)
Patient transported to MRI 

## 2020-07-27 NOTE — ED Notes (Signed)
Pt ambulatory in lobby, says he is going outside to smoke a cigarette.

## 2020-07-27 NOTE — Discharge Summary (Signed)
   Name: George Summers MRN: [id number] DOB: 08-19-1980 40 y.o. PCP: Patient, No Pcp Per  Date of Admission: 07/26/2020  7:26 PM Date of Discharge: 2/8/20222/01/2021 Attending Physician: No att. providers found  Discharge Diagnosis: 1. Paraspinal Abscess 2. Staph aureus Bacteremia  Discharge Medications: Allergies as of 07/27/2020   No Known Allergies     Medication List    TAKE these medications   methocarbamol 500 MG tablet Commonly known as: ROBAXIN Take 1 tablet (500 mg total) by mouth 2 (two) times daily.     ASK your doctor about these medications   ibuprofen 200 MG tablet Commonly known as: ADVIL Take 1,200 mg by mouth every 6 (six) hours as needed for headache or moderate pain.   naloxone 2 MG/2ML injection Commonly known as: NARCAN Place 1 mL into the nose as needed. Opioid overdose.       Disposition and follow-up:   George Summers left against medical advice from [facility] in Serious condition.   1. Paraspinal Abscess: on MRI, planned for IR aspiration but patient left AMA prior to this. Received IV vanc and ceftriaxone. BCID is positive for Staph aureus.   2.  Labs / imaging needed at time of follow-up: Repeat imaging, repeat blood culture   3.  Pending labs/ test needing follow-up: final blood culture   Follow-up Appointments:  Follow-up Information    Patient, No Pcp Per.   Specialty: Engineer, manufacturing       [facility].   Contact information: [address] [place] [address] [phone]              Hospital Course by problem list: 1. Paraspinal Abscess  George Summers is a 40yo male with past medical history of IVDU who presented with back pain and symptoms of withdrawal. MRI was done, which showed paraspinal abscess. Neurosurgery was consulted and recommended no surgical intervention but aspiration of abscess. Planned for IR aspiration but patient left AMA  prior to this stating he would return in 1-2 days. Prior to this he was started on methadone, dilaudid and received IV vanc and ceftriaxone. BCID is positive for Staph aureus. He was called by IMTS and infectious disease but did not answer.   Discharge Vitals:   BP 126/74   Pulse (!) 127   Temp 98.5 F (36.9 C) (Temporal)   Resp 16   Wt 63.5 kg   SpO2 96%   BMI 20.09 kg/m   Pertinent Labs, Studies, and Procedures:   BCID: Staph Aureus   Thoracic/Lumbar MRI 07/27/2020 IMPRESSION: Motion degraded studies.  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 Adjacent inferior right L4 facet marrow edema likely reflects osteomyelitis. Dorsal epidural enhancement is present at the L4 and L5 levels without abscess formation.  Minimal marrow edema and enhancement underlying the T9 superior endplate. Favored to be on a degenerative basis. Alternatively, could reflect early changes of infection.   Electronically Signed   By: Guadlupe Spanish M.D.   On: 07/27/2020 08:30   Discharge Instructions:   Signed: Versie Starks, DO 08/06/2020, 8:04 AM   Pager: [phone]

## 2020-07-27 NOTE — ED Notes (Signed)
Pt wants to leave, this nurse explained to pt that I would not give any methadone or dilaudid if he was leaving AMA - due to his hx of drug use, for fear of OD> stressed all negative outcomes to patient including paralysis and death, pt still says "I have things to do" Admitting MD notified.

## 2020-07-27 NOTE — ED Notes (Signed)
Pt requested ibuprofen while in lobby, states his last ibuprofen was well before PTA.

## 2020-07-27 NOTE — ED Provider Notes (Signed)
[facility] EMERGENCY DEPARTMENT Provider Note   CSN: [id number] Arrival date & time: 07/26/20  1914     History Chief Complaint  Patient presents with  . Back Pain  . Sciatica    KODEY XUE is a 40 y.o. male  HPI Patient is a 40 year old male he is a current IV drug user he uses heroin regularly.  He states he last injected yesterday.  He states for the past 3 days he has been having severe low back pain which came on initially when he woke up in the morning 3 days ago.  He states is progressively worsened.  He states he feels that he has had progressive weakness in his legs and is now having difficulty walking.  He denies any numbness or paresthesias although he does endorse shooting pains down bilateral legs.  He denies any difficulty peeing or pooping states that he is still doing so regularly.  No incontinence. He denies any fevers he does endorse occasional chills.  He describes the pain as the worst pain he has had before.  He has been taking ibuprofen and Tylenol with no improvement.      Past Medical History:  Diagnosis Date  . GI (gastrointestinal bleed)    PT reports a stomach ulcer  . 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   Past Surgical History:  Procedure Laterality Date  . TONSILLECTOMY         No family history on file.  Social History   Tobacco Use  . Smoking status: Current Every Day Smoker    Types: Cigarettes  . Smokeless tobacco: Never Used  Substance Use Topics  . Alcohol use: Yes    Comment: Pt drank last night.  . Drug use: Yes    Types: Marijuana    Comment: Last smoke was last night 06-20-15    Home Medications Prior to Admission medications   Medication Sig Start Date End Date Taking? Authorizing Provider  ibuprofen (ADVIL) 200 MG tablet Take 1,200 mg by mouth every 6 (six) hours as needed for headache or moderate pain.   Yes [provider]   methocarbamol (ROBAXIN) 500 MG tablet Take 1 tablet (500 mg total) by mouth 2 (two) times daily. 07/27/20  Yes Ugo Thoma, Rodrigo Ran, PA  naloxone (NARCAN) 2 MG/2ML injection Place 1 mL into the nose as needed. Opioid overdose. 09/22/19  Yes [provider]    Allergies    Patient has no known allergies.  Review of Systems   Review of Systems  Constitutional: Negative for chills and fever.  HENT: Negative for congestion.   Eyes: Negative for pain.  Respiratory: Negative for cough and shortness of breath.   Cardiovascular: Negative for chest pain and leg swelling.  Gastrointestinal: Negative for abdominal pain, diarrhea, nausea and vomiting.  Genitourinary: Negative for dysuria.  Musculoskeletal: Positive for back pain. Negative for myalgias.  Skin: Negative for rash.  Neurological: Negative for dizziness and headaches.    Physical Exam Updated Vital Signs BP 127/78 (BP Location: Right Arm)   Pulse (!) 104   Temp 98.7 F (37.1 C) (Rectal)   Resp 16   SpO2 96%   Physical Exam Vitals and nursing note reviewed.  Constitutional:      General: He is not in acute distress. HENT:     Head: Normocephalic and atraumatic.     Nose: Nose normal.  Eyes:  General: No scleral icterus. Cardiovascular:     Rate and Rhythm: Normal rate and regular rhythm.     Pulses: Normal pulses.     Heart sounds: Normal heart sounds.  Pulmonary:     Effort: Pulmonary effort is normal. No respiratory distress.     Breath sounds: No wheezing.  Abdominal:     Palpations: Abdomen is soft.     Tenderness: There is no abdominal tenderness. There is no guarding or rebound.  Musculoskeletal:     Cervical back: Normal range of motion.     Right lower leg: No edema.     Left lower leg: No edema.     Comments: Severe tenderness to palpation of caudal lumbar spine and cranial aspect of sacrum as well as mid lumbar.  There is no significant para lumbar muscular tenderness   Skin:    General: Skin is  warm and dry.     Capillary Refill: Capillary refill takes less than 2 seconds.  Neurological:     Mental Status: He is alert. Mental status is at baseline.     Comments: Sensation intact in bilateral lower extremities and upper extremities.  Strength 5/5 with flexion extension of the elbows and bilateral grip strength.  Lower extremity strength is limited secondary to pain.  Patient is able to stand and ambulate but with difficulty secondary to pain.  Psychiatric:        Mood and Affect: Mood normal.        Behavior: Behavior normal.     ED Results / Procedures / Treatments   Labs (all labs ordered are listed, but only abnormal results are displayed) Labs Reviewed  C-REACTIVE PROTEIN - Abnormal; Notable for the following components:      Result Value   CRP 26.4 (*)    All other components within normal limits  CBC WITH DIFFERENTIAL/PLATELET - Abnormal; Notable for the following components:   WBC 22.7 (*)    Neutro Abs 18.9 (*)    Monocytes Absolute 1.9 (*)    Abs Immature Granulocytes 0.16 (*)    All other components within normal limits  BASIC METABOLIC PANEL - Abnormal; Notable for the following components:   Glucose, Bld 127 (*)    All other components within normal limits  SEDIMENTATION RATE    EKG None  Radiology No results found.  Procedures Procedures   Medications Ordered in ED Medications  sodium chloride 0.9 % bolus 1,000 mL (has no administration in time range)  ibuprofen (ADVIL) tablet 400 mg (400 mg Oral Given 07/27/20 0121)  acetaminophen (TYLENOL) tablet 650 mg (650 mg Oral Given 07/27/20 0441)  LORazepam (ATIVAN) tablet 1.5 mg (1.5 mg Oral Given 07/27/20 6045)    ED Course  I have reviewed the triage vital signs and the nursing notes.  Pertinent labs & imaging results that were available during my care of the patient were reviewed by me and considered in my medical decision making (see chart for details).   Clinical Course as of 07/27/20 0709  Tue Jul 27, 2020  0706 WBC(!): 22.7 Patient with midline low back pain is an IV drug user has leukocytosis of 22.7 with left shift also with elevated C-reactive protein.  Concern for spinal epidural abscess.  Patient is aware if this MRI imaging is negative for spinal epidural abscess he will be discharged with Tylenol and ibuprofen and muscle relaxer prescription. He is aware that no narcotic pain medicine will be used. [WF]  0708 BMP within normal limits [  WF]    Clinical Course User Index [WF] Gailen Shelter, [place]   Patient has received ibuprofen will give 1 dose of Tylenol and Ativan prior to MRI.  MRI ordered and patient is aware of plan.  Oncoming team made aware of patient.  Patient care transferred to GG PA-C at 7:11 AM   MDM Rules/Calculators/A&P                           Final Clinical Impression(s) / ED Diagnoses Final diagnoses:  Acute midline low back pain, unspecified whether sciatica present    Rx / DC Orders ED Discharge Orders         Ordered    methocarbamol (ROBAXIN) 500 MG tablet  2 times daily        07/27/20 0706           Gailen Shelter, PA 07/27/20 5456    Shon Baton, MD 07/27/20 [phone]

## 2020-07-27 NOTE — H&P (View-Only) (Signed)
Date: 07/27/2020               Patient Name:  George Summers MRN: [id number]  DOB: 01/15/1981 Age / Sex: 40 y.o., male   PCP: Patient, No Pcp Per         Medical Service: Internal Medicine Teaching Service         Attending Physician: Dr. Earl Lagos, MD    First Contact: Dr. Elaina Pattee Pager: [phone]  Second Contact: Dr. Mcarthur Rossetti Pager: [phone]       After Hours (After 5p/  First Contact Pager: [phone]  weekends / holidays): Second Contact Pager: [phone]   Chief Complaint: back pain  History of Present Illness:   [name] is a 40yo male with PMH of IVDU presenting with lower back pain. He is not quite sure when it started and answers some questions with mumbling and did not respond to all questions. He was rousable to voice and oriented x3 but would fall asleep or start mumbling to himself again. He mentions that he thinks he's having surgery today, but then forgot why he is here.  The pain in his back constant and has only slightly improved since coming to the hospital. It is worsened with any movement. He denies lower extremity numbness or weakness. He has no difficulty with urination or dysuria. He does state he last used heroin a couple of days ago as he was trying to come to the doctor due to his back pain. He feels sweaty, chills and like his heart is racing. No chest pain, fever, shortness of breath, nausea or cough. He denies blurring of vision or headaches.  He drinks occasionally, maybe once per week and has never had alcohol withdrawal. He uses IV heroin but does not use other drugs. He feels like he is currently going through heroin withdrawal. He does not take any medications.  He states yes and no when asked if he would like to stop using heroin.    ED Course:  HR was 104, Resp 16, bp 108/70, sats 96% on room air. WBC 24. With history of IVDU MRI was done which showed paraspinal myositis with abscess at L4 and likely osteomyelitis. Neurosurgery was consulted  and with no epidural abscess recommended IR drainage and requested to wait for antibiotic therapy. Blood cultures were drawn. He was given a 1L bolus NS and 1 mg IV dilaudid for pain.   Social:   Unable to obtain complete social history due to confusion  Uses IV heroin, does not use other drugs recreationally    Family History:   No family history per patient.   Meds:  Current Meds  Medication Sig  . ibuprofen (ADVIL) 200 MG tablet Take 1,200 mg by mouth every 6 (six) hours as needed for headache or moderate pain.  . methocarbamol (ROBAXIN) 500 MG tablet Take 1 tablet (500 mg total) by mouth 2 (two) times daily.  . naloxone (NARCAN) 2 MG/2ML injection Place 1 mL into the nose as needed. Opioid overdose.     Allergies: Allergies as of 07/26/2020  . (No Known Allergies)   Past Medical History:  Diagnosis Date  . GI (gastrointestinal bleed)    PT reports a stomach ulcer  . 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   Physical Exam: Blood pressure 112/68, pulse (!) 129, temperature 98.5 F (36.9 C), temperature source Temporal, resp. rate  16, weight 63.5 kg, SpO2 97 %.  Constitution: diaphoretic, mumbling to self, acutely ill appearing  HENT: poor dentition with multiple teeth missing, no gross abscess or infection, otherwise [place]/AT Eyes: eom intact, rotary nystagmus, +diplopia, rt pupil 4mm, lft pupil 3mm Cardio: tachycardia, regular rhythm, no m/r/g, no LE edema  Respiratory: CTA, on room air, non-labored breathing Abdominal: soft, non-distended, NTTP, normal BS  MSK: strength 5/5 symmetrical upper and lower extremities, sensation intact  Neuro: orientedx3, mumbling to self, answers some questions inappropriately and confuse, no active hallucination, CN II-XII intact  Skin: right arm with track marks and small amount of swelling    EKG: personally reviewed my interpretation is pending   MRI Thoracic and Lumbar  Spine IMPRESSION: Motion degraded studies.  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 Adjacent inferior right L4 facet marrow edema likely reflects osteomyelitis. Dorsal epidural enhancement is present at the L4 and L5 levels without abscess formation.  Minimal marrow edema and enhancement underlying the T9 superior endplate. Favored to be on a degenerative basis. Alternatively, could reflect early changes of infection. Electronically Signed   By: Praneil  Patel M.D.   On: 07/27/2020 08:30  Assessment & Plan by Problem: Active Problems:   Paraspinal abscess (HCC)  Opie W. Wickizer is a 39yo male with PMH of IVDU presenting with lower back pain, found to have paraspinal abscess on MRI.   Paraspinal Abscess and Myositis L4 2/2 IVDU MRI showing paraspinal abscess and myositis. Seen by neurosurgery, no indication for surgery currently. IR consulted for possible drainage. He has no signs of lower extremity neurologic changes but does have changes in vision and rotary nystagmus with right pupil larger than left.   - f/u IR - start vanc/ceftriaxone - blood cultures pending  - MRI brain w/contrast - 1mg ativan prn for anxiety - echo ordered - dilaudid 1mg q2h prn, toradol q8h prn, tylenol scheduled 650 mg q8h - UDS - trend CBC    Heroin Withdrawal In active withdrawal. S/p 2mg dilaudid IV.   - methadone 10 mg now - can give additional 10 if no improvement in symptoms, continue 10 mg daily  - dilaudid 1mg q2h prn pain   Diet: regular  VTE: lovenox  IVF: 1L NS Code: full   Dispo: Admit patient to Inpatient with expected length of stay greater than 2 midnights.  Signed: Chole Driver A, DO 07/27/2020, 12:50 PM  Pager: [phone]  

## 2020-07-27 NOTE — Discharge Instructions (Signed)
Your MRI was negative for any spinal epidural abscess. Please take Tylenol and ibuprofen as directed below.  You may also use Robaxin. Please use Tylenol or ibuprofen for pain.  You may use 600 mg ibuprofen every 6 hours or 1000 mg of Tylenol every 6 hours.  You may choose to alternate between the 2.  This would be most effective.  Not to exceed 4 g of Tylenol within 24 hours.  Not to exceed 3200 mg ibuprofen 24 hours.

## 2020-07-27 NOTE — Progress Notes (Signed)
12 hr. Contrast delay per radiology protocol for repeat imaging w/ contrast on this patient. Ordering Dr. Eather Colas. TBD this evening.

## 2020-07-27 NOTE — ED Notes (Signed)
Pt requesting antibiotic rx to go home- explained that if he was leaving, it was against medical advice and there are no prescriptions. Once again, the negative outcomes of leaving AMA were discussed with pt.

## 2020-07-28 ENCOUNTER — Telehealth: Payer: Self-pay | Admitting: Internal Medicine

## 2020-07-28 LAB — BLOOD CULTURE ID PANEL (REFLEXED) - BCID2

## 2020-07-28 LAB — CULTURE, BLOOD (ROUTINE X 2): Culture: NO GROWTH

## 2020-07-28 NOTE — Progress Notes (Signed)
Attempted to contact patient. Went to his phone's message. Left message to come back to ED for blood stream infection   Attempted to call his grandmother's listed number but she reports not keeping in contact with patient nor she knows his contact

## 2020-07-28 NOTE — Telephone Encounter (Signed)
Left voicemail to come back to the emergency room due to bloodstream infection.

## 2020-07-29 ENCOUNTER — Other Ambulatory Visit: Payer: Self-pay

## 2020-07-29 ENCOUNTER — Inpatient Hospital Stay (HOSPITAL_COMMUNITY)
Admission: EM | Admit: 2020-07-29 | Discharge: 2020-08-06 | DRG: 095 | Disposition: A | Discharge status: Home / Self Care | Payer: Self-pay | Attending: Internal Medicine | Admitting: Internal Medicine

## 2020-07-29 ENCOUNTER — Encounter (HOSPITAL_COMMUNITY): Payer: Self-pay | Admitting: Emergency Medicine

## 2020-07-29 DIAGNOSIS — F1193 Opioid use, unspecified with withdrawal: Secondary | ICD-10-CM | POA: Diagnosis present

## 2020-07-29 DIAGNOSIS — R7401 Elevation of levels of liver transaminase levels: Secondary | ICD-10-CM

## 2020-07-29 DIAGNOSIS — R042 Hemoptysis: Secondary | ICD-10-CM

## 2020-07-29 DIAGNOSIS — R300 Dysuria: Secondary | ICD-10-CM | POA: Diagnosis present

## 2020-07-29 DIAGNOSIS — F419 Anxiety disorder, unspecified: Secondary | ICD-10-CM | POA: Diagnosis not present

## 2020-07-29 DIAGNOSIS — R072 Precordial pain: Secondary | ICD-10-CM | POA: Diagnosis present

## 2020-07-29 DIAGNOSIS — R350 Frequency of micturition: Secondary | ICD-10-CM | POA: Diagnosis present

## 2020-07-29 DIAGNOSIS — B9562 Methicillin resistant Staphylococcus aureus infection as the cause of diseases classified elsewhere: Secondary | ICD-10-CM | POA: Diagnosis present

## 2020-07-29 DIAGNOSIS — R7881 Bacteremia: Secondary | ICD-10-CM | POA: Diagnosis present

## 2020-07-29 DIAGNOSIS — R053 Chronic cough: Secondary | ICD-10-CM | POA: Diagnosis present

## 2020-07-29 DIAGNOSIS — I809 Phlebitis and thrombophlebitis of unspecified site: Secondary | ICD-10-CM | POA: Diagnosis present

## 2020-07-29 DIAGNOSIS — M4620 Osteomyelitis of vertebra, site unspecified: Secondary | ICD-10-CM

## 2020-07-29 DIAGNOSIS — Z79899 Other long term (current) drug therapy: Secondary | ICD-10-CM

## 2020-07-29 DIAGNOSIS — R296 Repeated falls: Secondary | ICD-10-CM | POA: Diagnosis present

## 2020-07-29 DIAGNOSIS — B1710 Acute hepatitis C without hepatic coma: Secondary | ICD-10-CM | POA: Diagnosis present

## 2020-07-29 DIAGNOSIS — B9561 Methicillin susceptible Staphylococcus aureus infection as the cause of diseases classified elsewhere: Secondary | ICD-10-CM | POA: Diagnosis present

## 2020-07-29 DIAGNOSIS — L0291 Cutaneous abscess, unspecified: Secondary | ICD-10-CM

## 2020-07-29 DIAGNOSIS — G061 Intraspinal abscess and granuloma: Principal | ICD-10-CM | POA: Diagnosis present

## 2020-07-29 DIAGNOSIS — F17210 Nicotine dependence, cigarettes, uncomplicated: Secondary | ICD-10-CM | POA: Diagnosis present

## 2020-07-29 DIAGNOSIS — F1123 Opioid dependence with withdrawal: Secondary | ICD-10-CM | POA: Diagnosis present

## 2020-07-29 DIAGNOSIS — M4626 Osteomyelitis of vertebra, lumbar region: Secondary | ICD-10-CM | POA: Diagnosis present

## 2020-07-29 DIAGNOSIS — M609 Myositis, unspecified: Secondary | ICD-10-CM | POA: Diagnosis present

## 2020-07-29 DIAGNOSIS — M25551 Pain in right hip: Secondary | ICD-10-CM | POA: Diagnosis present

## 2020-07-29 LAB — CULTURE, BLOOD (ROUTINE X 2): Special Requests: ADEQUATE

## 2020-07-29 NOTE — ED Triage Notes (Signed)
Patient reports low back pain for several days seen here 2 days ago for the same complaint , but left AMA , diagnosed with epidural abscess , blood tests and MRI done . Denies fever or chills , ambulatory .

## 2020-07-30 ENCOUNTER — Inpatient Hospital Stay (HOSPITAL_COMMUNITY): Payer: Self-pay

## 2020-07-30 DIAGNOSIS — M4620 Osteomyelitis of vertebra, site unspecified: Secondary | ICD-10-CM

## 2020-07-30 DIAGNOSIS — R7881 Bacteremia: Secondary | ICD-10-CM

## 2020-07-30 DIAGNOSIS — B9561 Methicillin susceptible Staphylococcus aureus infection as the cause of diseases classified elsewhere: Secondary | ICD-10-CM

## 2020-07-30 HISTORY — PX: IR US GUIDE BX ASP/DRAIN: IMG2392

## 2020-07-30 LAB — COMPREHENSIVE METABOLIC PANEL
ALT: 130 U/L — ABNORMAL HIGH (ref 0–44)
ALT: 140 U/L — ABNORMAL HIGH (ref 0–44)
AST: 111 U/L — ABNORMAL HIGH (ref 15–41)
AST: 130 U/L — ABNORMAL HIGH (ref 15–41)
Albumin: 2.6 g/dL — ABNORMAL LOW (ref 3.5–5.0)
Albumin: 2.5 g/dL — ABNORMAL LOW (ref 3.5–5.0)
Alkaline Phosphatase: 70 U/L (ref 38–126)
Alkaline Phosphatase: 69 U/L (ref 38–126)
Anion gap: 12 (ref 5–15)
Anion gap: 12 (ref 5–15)
BUN: 17 mg/dL (ref 6–20)
BUN: 10 mg/dL (ref 6–20)
CO2: 27 mmol/L (ref 22–32)
CO2: 26 mmol/L (ref 22–32)
Calcium: 8.6 mg/dL — ABNORMAL LOW (ref 8.9–10.3)
Calcium: 8.6 mg/dL — ABNORMAL LOW (ref 8.9–10.3)
Chloride: 94 mmol/L — ABNORMAL LOW (ref 98–111)
Chloride: 95 mmol/L — ABNORMAL LOW (ref 98–111)
Creatinine, Ser: 0.63 mg/dL (ref 0.61–1.24)
Creatinine, Ser: 0.65 mg/dL (ref 0.61–1.24)
GFR, Estimated: >60 mL/min (ref >60)
GFR, Estimated: >60 mL/min (ref >60)
Glucose, Bld: 100 mg/dL — ABNORMAL HIGH (ref 70–99)
Glucose, Bld: 147 mg/dL — ABNORMAL HIGH (ref 70–99)
Potassium: 4.1 mmol/L (ref 3.5–5.1)
Potassium: 3.9 mmol/L (ref 3.5–5.1)
Sodium: 133 mmol/L — ABNORMAL LOW (ref 135–145)
Sodium: 133 mmol/L — ABNORMAL LOW (ref 135–145)
Total Bilirubin: 0.4 mg/dL (ref 0.3–1.2)
Total Bilirubin: 0.3 mg/dL (ref 0.3–1.2)
Total Protein: 6.3 g/dL — ABNORMAL LOW (ref 6.5–8.1)
Total Protein: 6.3 g/dL — ABNORMAL LOW (ref 6.5–8.1)

## 2020-07-30 LAB — CBC WITH DIFFERENTIAL/PLATELET
Abs Immature Granulocytes: 0.09 10*3/uL — ABNORMAL HIGH (ref 0.00–0.07)
Basophils Absolute: 0.1 10*3/uL (ref 0.0–0.1)
Basophils Relative: 1 %
Eosinophils Absolute: 0.3 10*3/uL (ref 0.0–0.5)
Eosinophils Relative: 2 %
HCT: 40.7 % (ref 39.0–52.0)
Hemoglobin: 13.9 g/dL (ref 13.0–17.0)
Immature Granulocytes: 1 %
Lymphocytes Relative: 18 %
Lymphs Abs: 2.7 10*3/uL (ref 0.7–4.0)
MCH: 30.7 pg (ref 26.0–34.0)
MCHC: 34.2 g/dL (ref 30.0–36.0)
MCV: 89.8 fL (ref 80.0–100.0)
Monocytes Absolute: 1.9 10*3/uL — ABNORMAL HIGH (ref 0.1–1.0)
Monocytes Relative: 13 %
Neutro Abs: 10.1 10*3/uL — ABNORMAL HIGH (ref 1.7–7.7)
Neutrophils Relative %: 65 %
Platelets: 407 10*3/uL — ABNORMAL HIGH (ref 150–400)
RBC: 4.53 MIL/uL (ref 4.22–5.81)
RDW: 15.3 % (ref 11.5–15.5)
WBC: 15.3 10*3/uL — ABNORMAL HIGH (ref 4.0–10.5)
nRBC: 0 % (ref 0.0–0.2)

## 2020-07-30 LAB — URINALYSIS, ROUTINE W REFLEX MICROSCOPIC
Bacteria, UA: NONE SEEN
Bilirubin Urine: NEGATIVE
Glucose, UA: NEGATIVE mg/dL
Hgb urine dipstick: NEGATIVE
Ketones, ur: NEGATIVE mg/dL
Leukocytes,Ua: NEGATIVE
Nitrite: NEGATIVE
Protein, ur: NEGATIVE mg/dL
Specific Gravity, Urine: 1.017 (ref 1.005–1.030)
pH: 7 (ref 5.0–8.0)

## 2020-07-30 LAB — LACTIC ACID, PLASMA: Lactic Acid, Venous: 1.8 mmol/L (ref 0.5–1.9)

## 2020-07-30 LAB — CBC
HCT: 43.1 % (ref 39.0–52.0)
Hemoglobin: 14.2 g/dL (ref 13.0–17.0)
MCH: 29.7 pg (ref 26.0–34.0)
MCHC: 32.9 g/dL (ref 30.0–36.0)
MCV: 90.2 fL (ref 80.0–100.0)
Platelets: 411 10*3/uL — ABNORMAL HIGH (ref 150–400)
RBC: 4.78 MIL/uL (ref 4.22–5.81)
RDW: 15.2 % (ref 11.5–15.5)
WBC: 16.3 10*3/uL — ABNORMAL HIGH (ref 4.0–10.5)
nRBC: 0 % (ref 0.0–0.2)

## 2020-07-30 LAB — CULTURE, BLOOD (ROUTINE X 2)

## 2020-07-30 LAB — GLUCOSE, CAPILLARY: Glucose-Capillary: 141 mg/dL — ABNORMAL HIGH (ref 70–99)

## 2020-07-30 IMAGING — MR MR HIP*R* W/O CM
4 of 5 series · 19 of 40 positions shown · non-contrast
Comparison: None.

CLINICAL DATA: Right hip pain.  MSSA bacteremia.  IV drug abuser.

EXAM:
MR OF THE RIGHT HIP WITHOUT CONTRAST
TECHNIQUE: Multiplanar, multisequence MR imaging was performed. No intravenous
contrast was administered.

[Series 2: T2 fat-sat · coronal · 4.0mm · 0.66mm/px · 4 of 37 slices shown (1 of 2)]
[im 1/37]
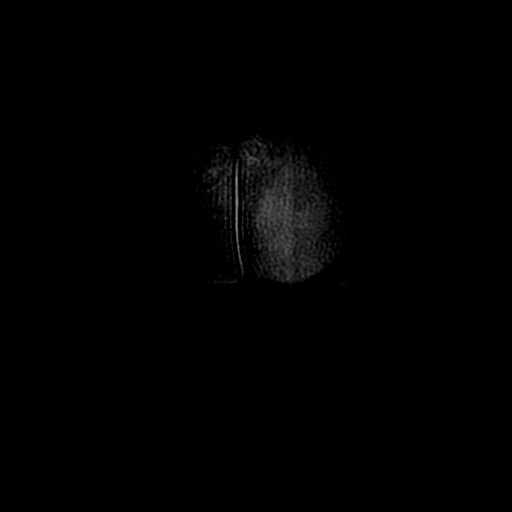
[im 6/37]
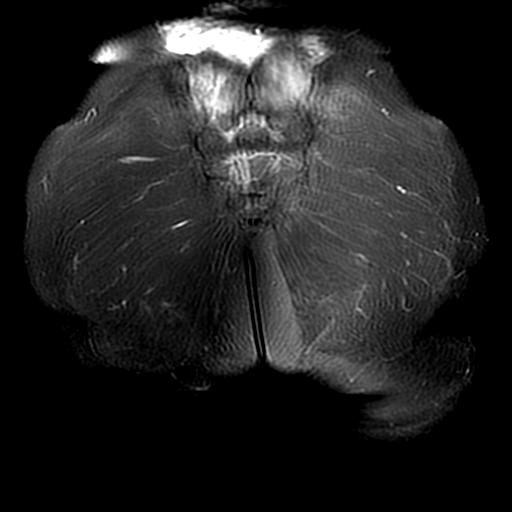
[im 21/37]
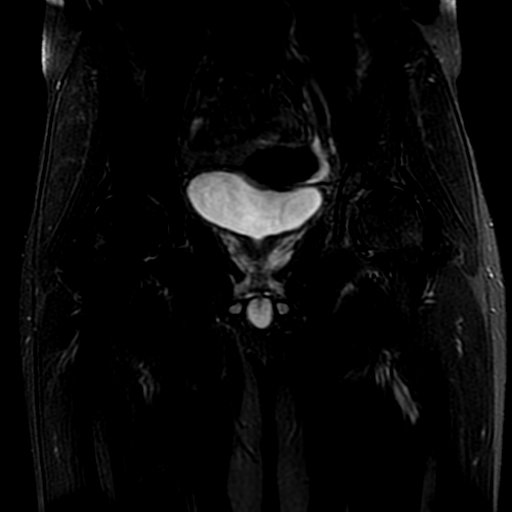
[im 31/37]
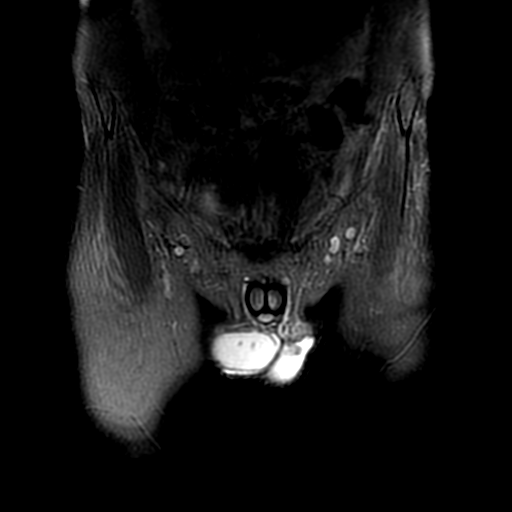

[Series 3: T1 · coronal · 4.0mm · 0.66mm/px · 3 of 37 slices shown]
[im 7/37]
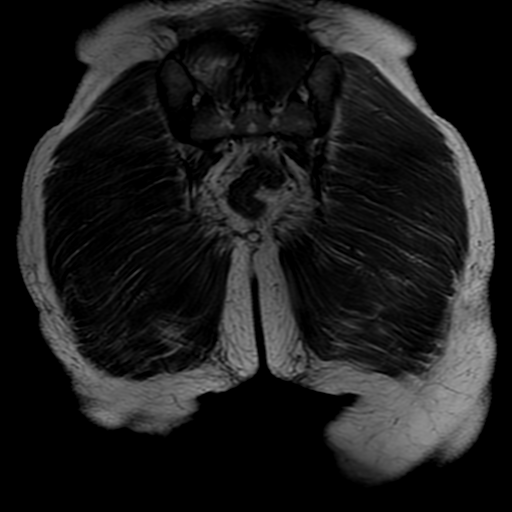
[im 19/37]
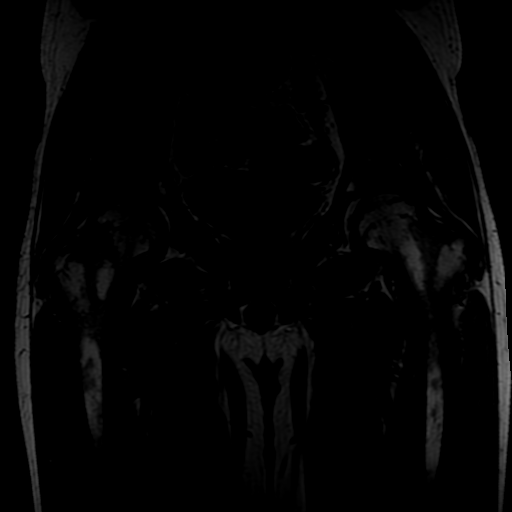
[im 31/37]
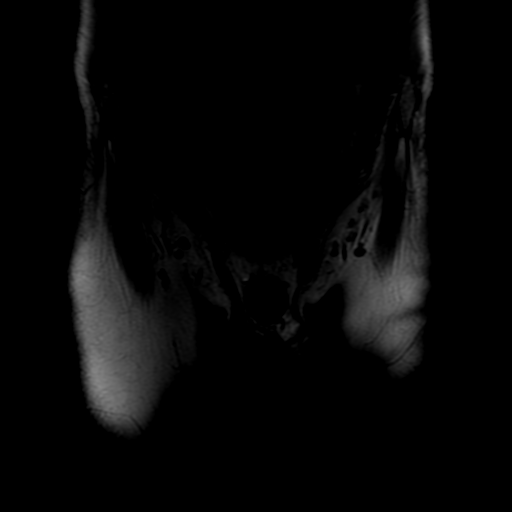

[Series 4: T2 fat-sat · axial · 4.0mm · 0.35mm/px · z∈[-91,+49]mm · 3 of 40 slices shown (2 of 2)]
[im 6/40]
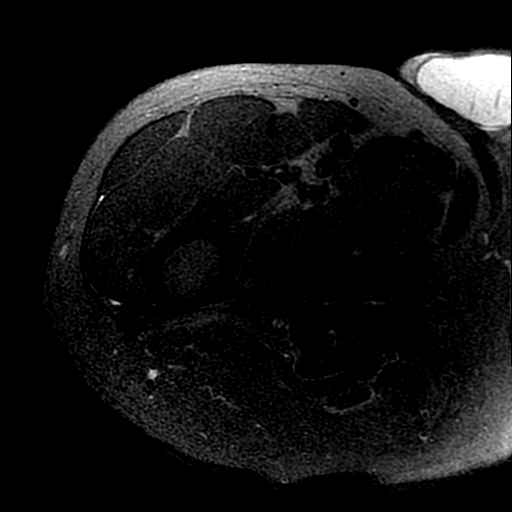
[im 23/40]
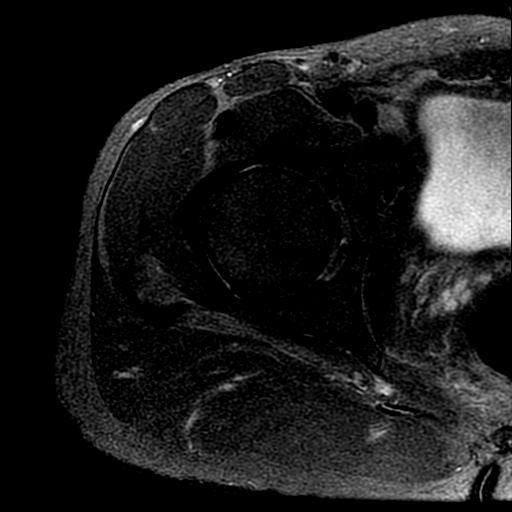
[im 34/40]
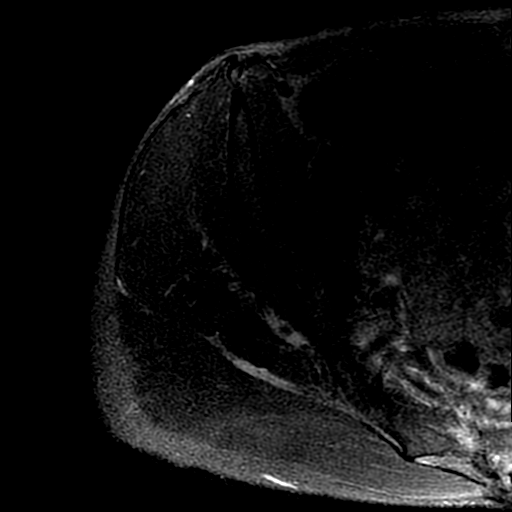

[Series 6: PD fat-sat · sagittal · 4.0mm · 0.35mm/px · 9 of 44 slices shown]
[im 1/44]
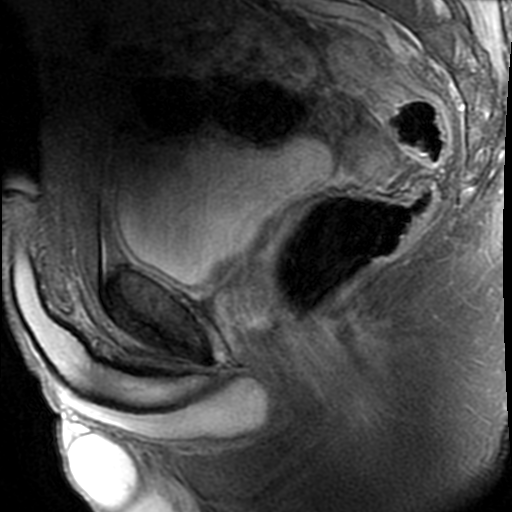
[im 6/44]
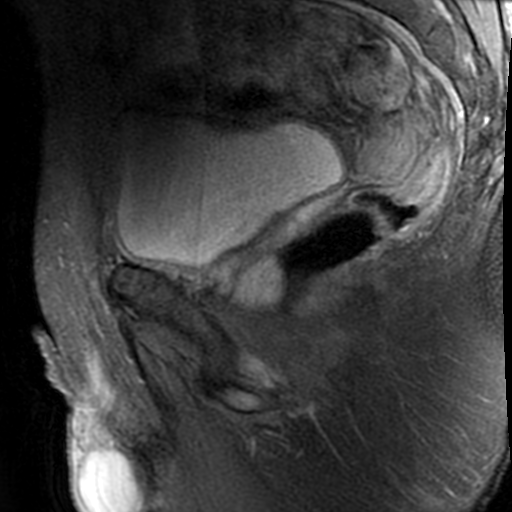
[im 11/44]
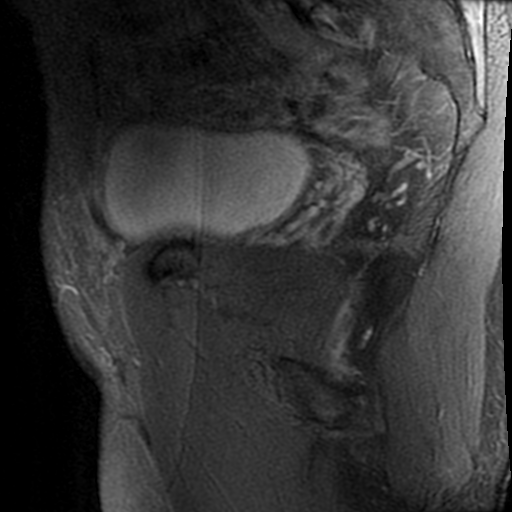
[im 17/44]
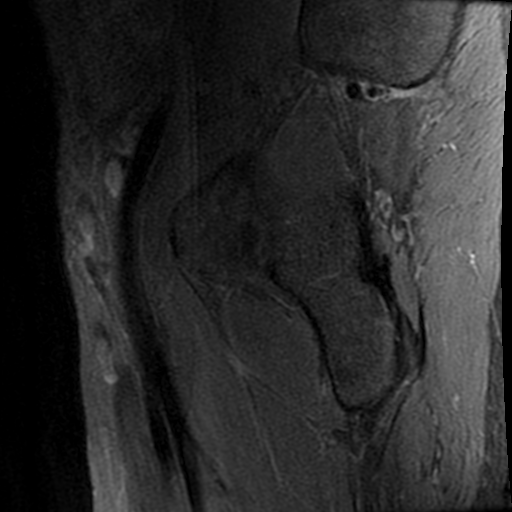
[im 22/44]
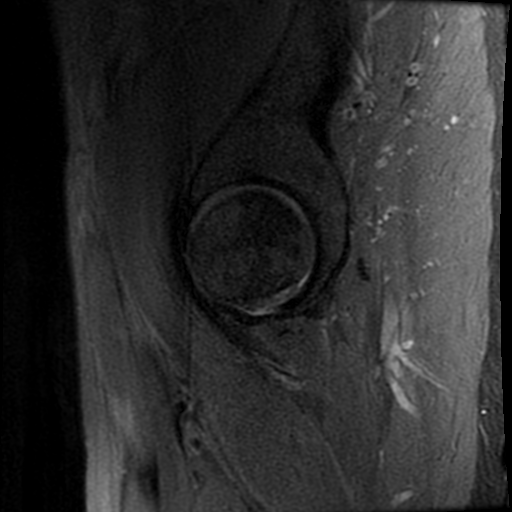
[im 27/44]
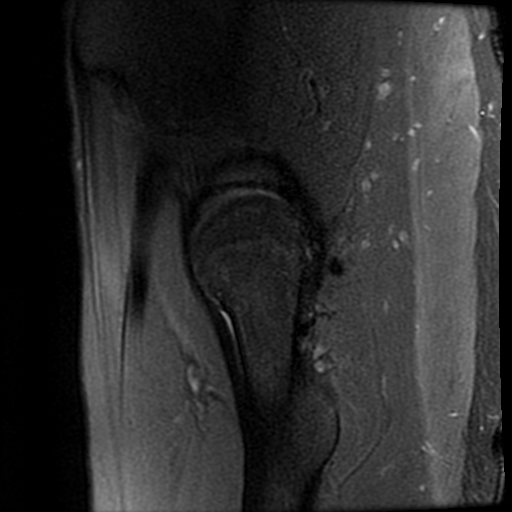
[im 33/44]
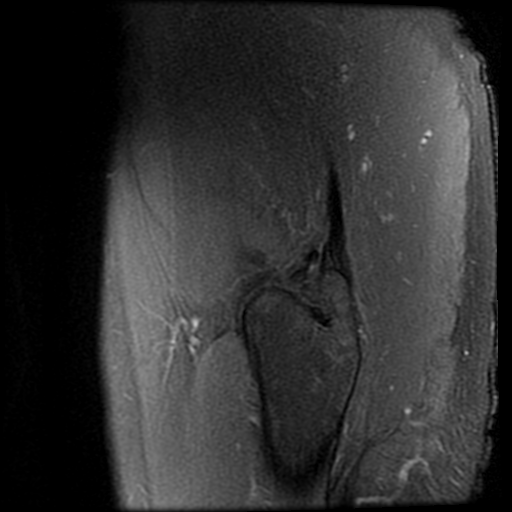
[im 38/44]
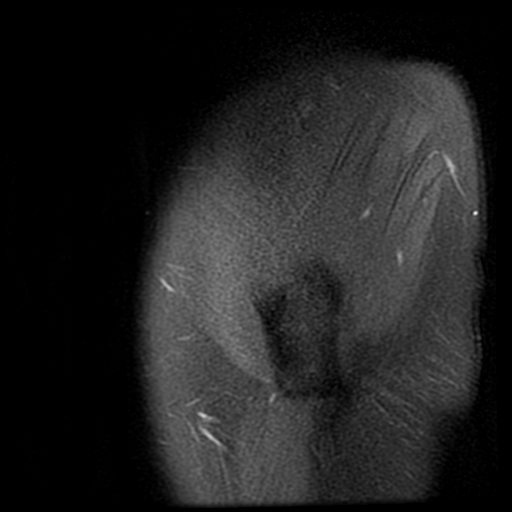
[im 44/44]
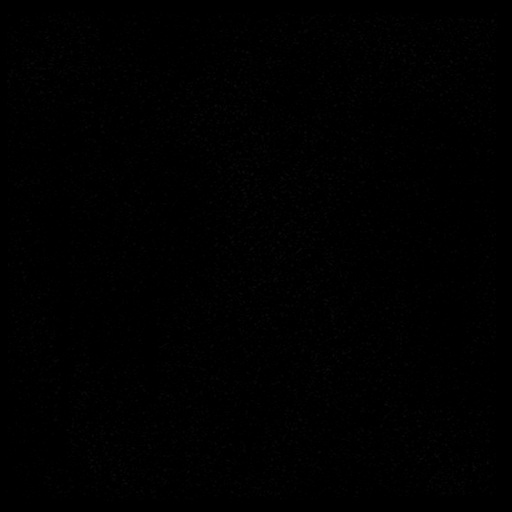

[19 of 40 positions shown; findings below may reference images not displayed]

FINDINGS: Bones: There is no evidence of acute fracture, dislocation or
avascular necrosis. No focal bone lesion. The visualized sacroiliac
joints and symphysis pubis appear normal.

Articular cartilage and labrum

Articular cartilage: No focal chondral defect. Small subchondral
cysts in the left acetabular roof.

Labrum: Grossly intact, although evaluation is limited due to lack
of intra-articular fluid. No paralabral abnormality.

Joint or bursal effusion

Joint effusion: No significant hip joint effusion.

Bursae: No focal periarticular fluid collection.

Muscles and tendons

Muscles and tendons: The visualized gluteus, hamstring and iliopsoas
tendons appear normal. Severe lower paraspinous muscle edema, better
evaluated on recent lumbar spine MRI.

Other findings

Miscellaneous: The visualized internal pelvic contents appear
unremarkable. Trace free fluid in the pelvis, nonspecific.
IMPRESSION: 1. No acute abnormality of the right hip. No evidence of septic
arthritis.
2. Severe lower paraspinous muscle edema, better evaluated on recent
lumbar spine MRI.

## 2020-07-30 IMAGING — DX DG CHEST 1V PORT
1 series · 1 of 1 positions shown · non-contrast
Comparison: Chest radiographs [DATE] and earlier.

CLINICAL DATA: 39-year-old male with cough and bacteremia. Patient
uncooperative.

EXAM:
PORTABLE CHEST 1 VIEW

[chest ap]
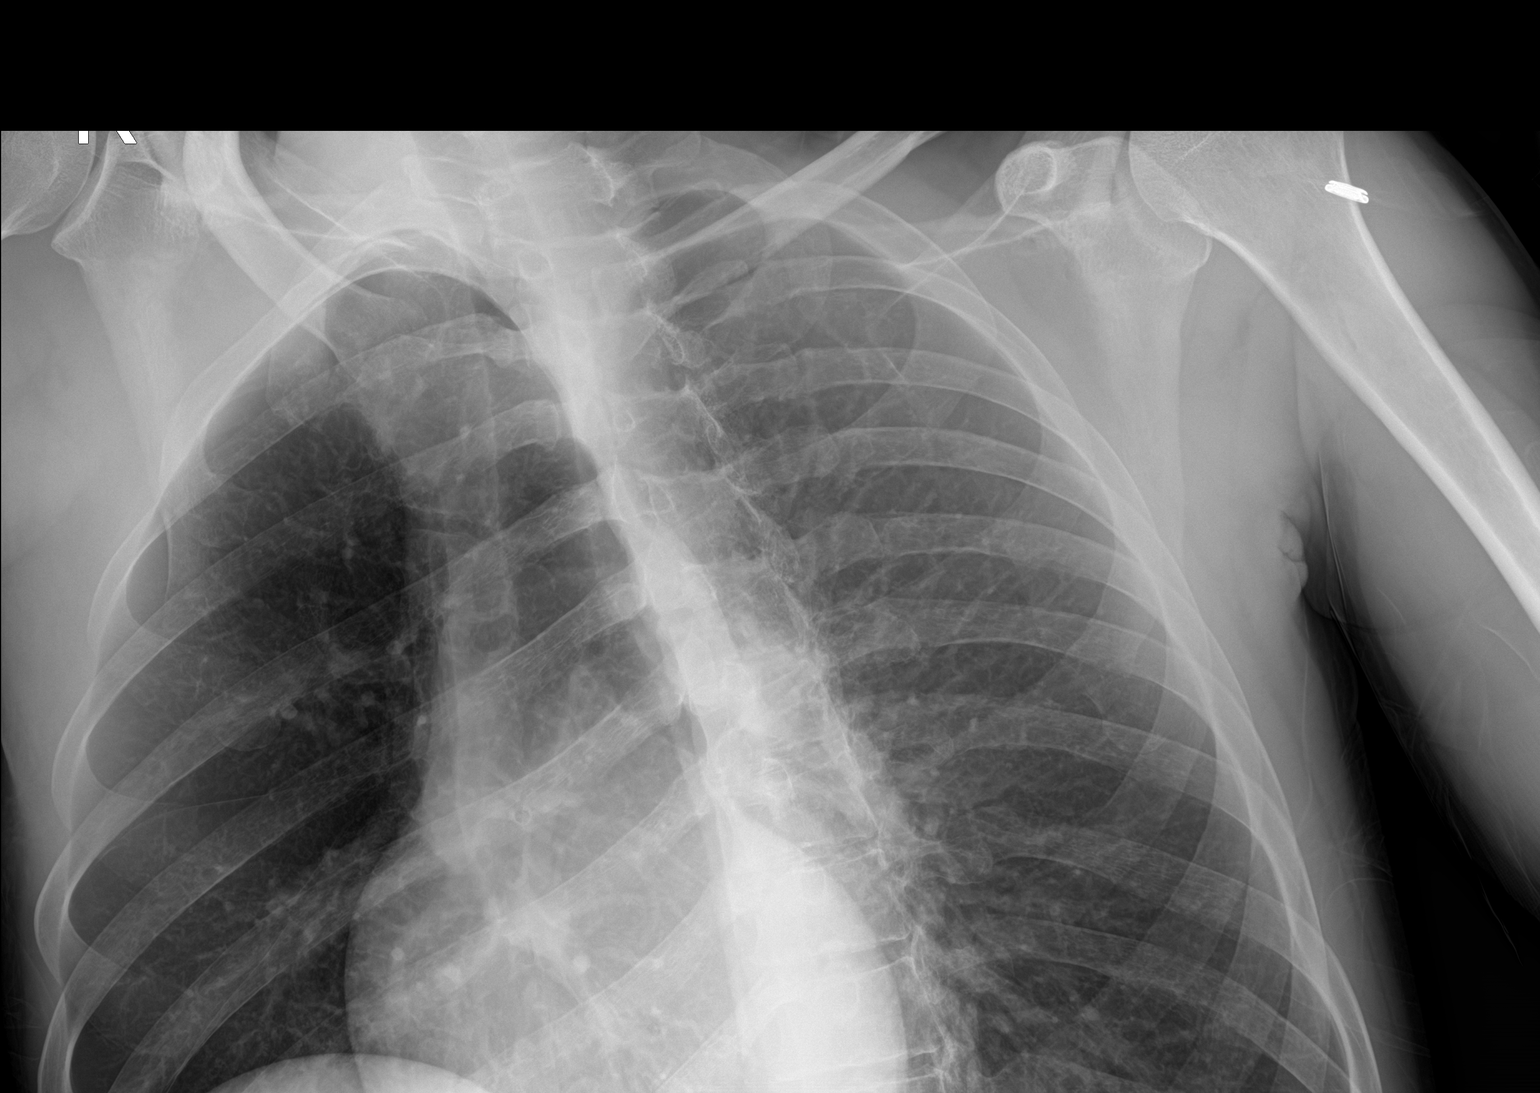

[1 of 1 positions shown; findings below may reference images not displayed]

FINDINGS: Portable rotated supine view at [7F] hours. The lower chest is not
completely visible. Lung volumes appear stable and at the upper
limits of normal. Mediastinal contours are within normal limits.
Visualized tracheal air column is within normal limits. No
pneumothorax, pulmonary edema or focal pulmonary opacity on this
image. No acute osseous abnormality identified.
IMPRESSION: Decreased patient cooperation resulting in rotated view not fully
including the lung bases.

No acute cardiopulmonary abnormality identified.

## 2020-07-30 IMAGING — US IR US GUIDANCE
1 series · 10 of 10 positions shown · non-contrast
Comparison: none

INDICATION: 39-year-old gentleman with paraspinal inflammation and abscess
presents today measure radiology for imaging guided aspiration.

[Series 1: ir us guidance · 10 of 10 slices shown]
[im 1/10]
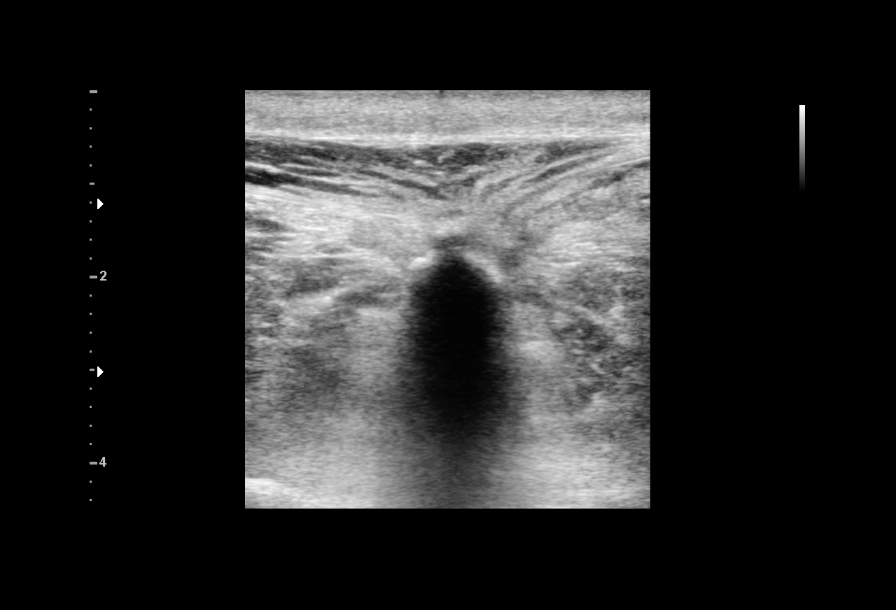
[im 2/10]
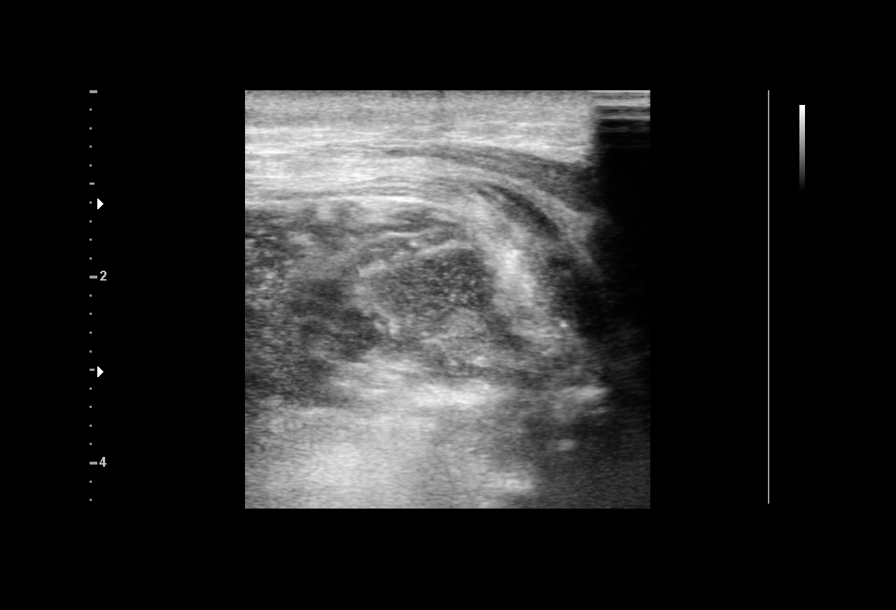
[im 3/10]
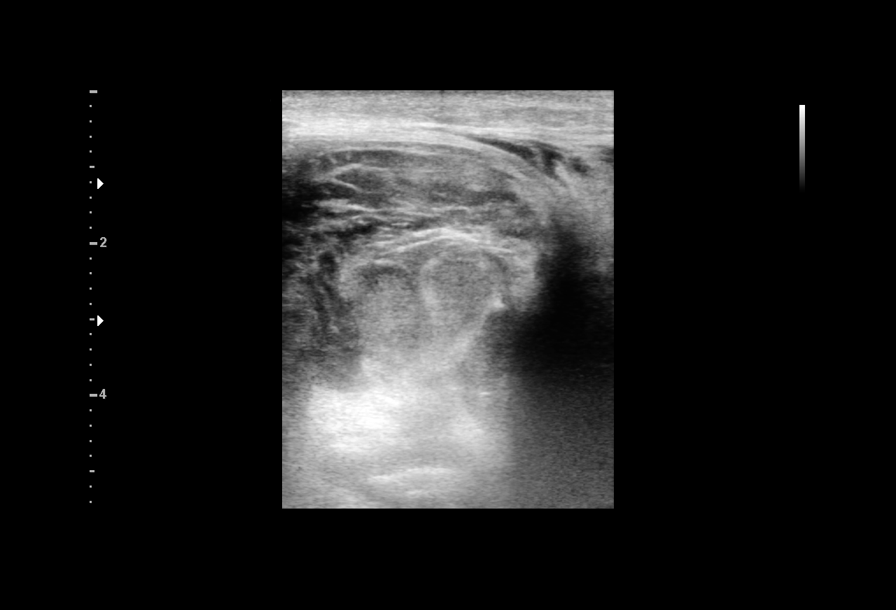
[im 4/10]
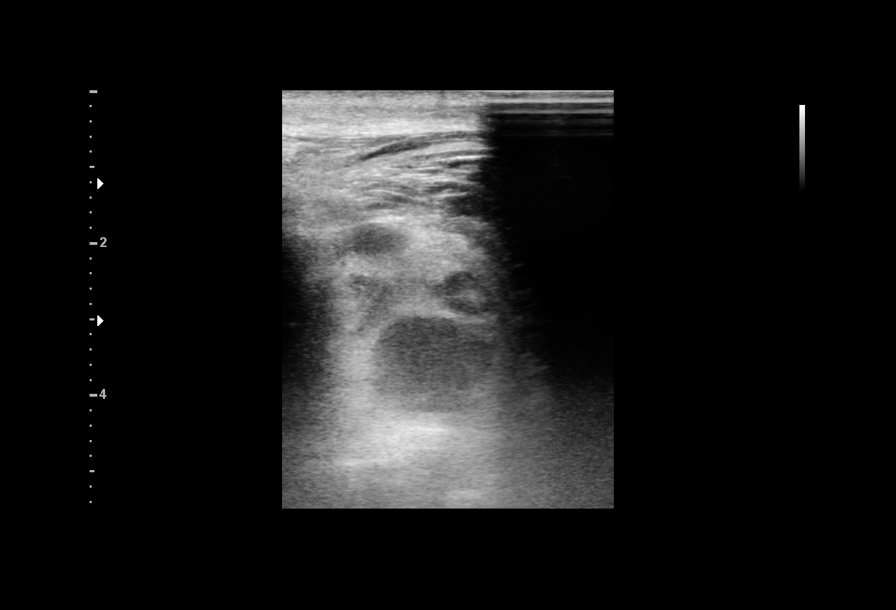
[im 5/10]
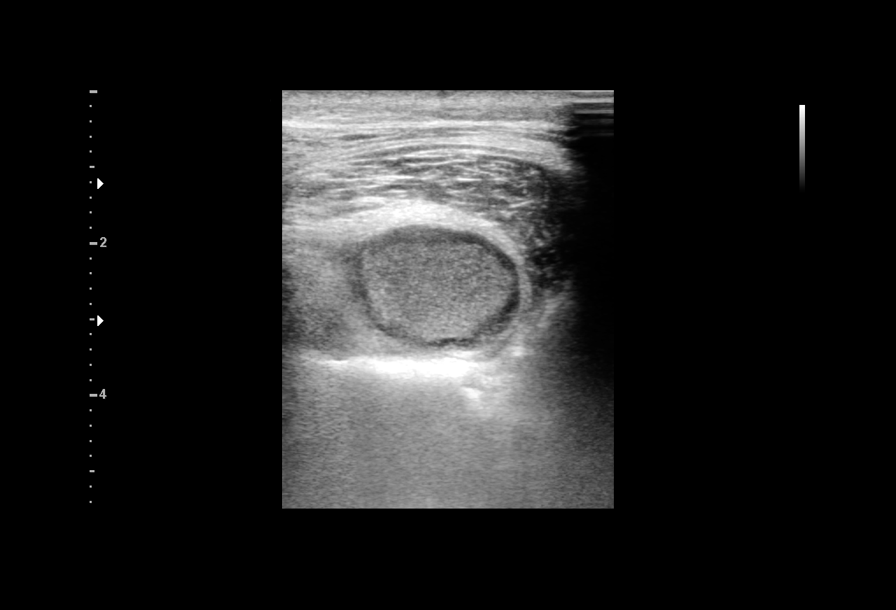
[im 6/10]
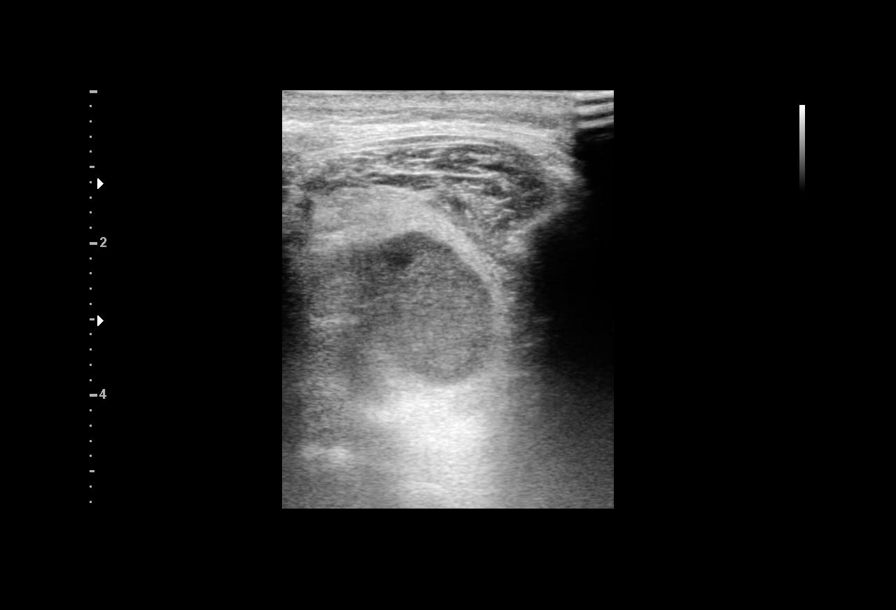
[im 7/10]
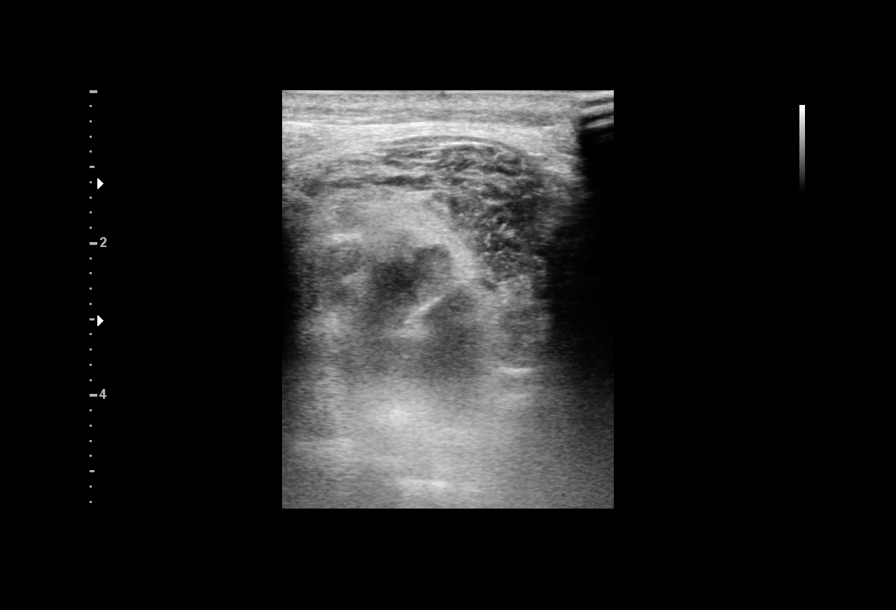
[im 8/10]
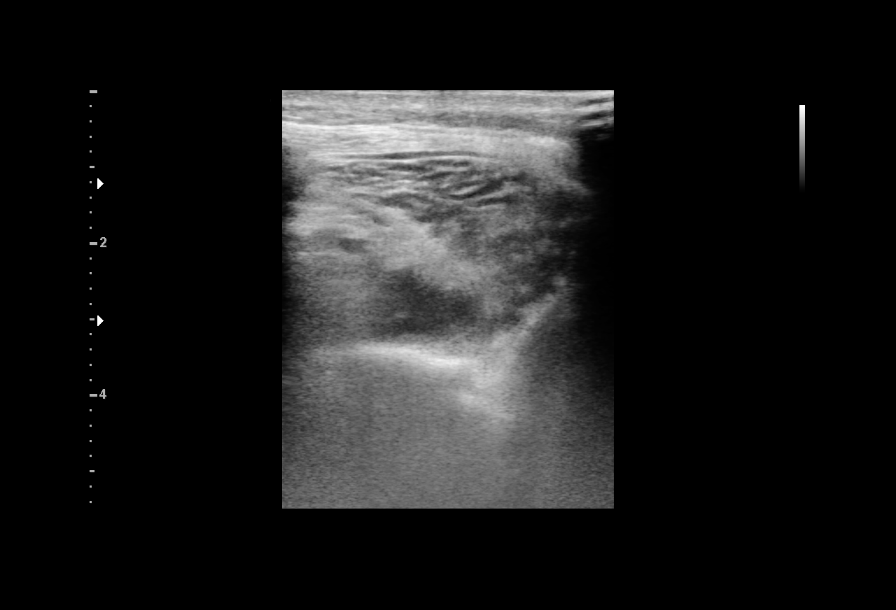
[im 9/10]
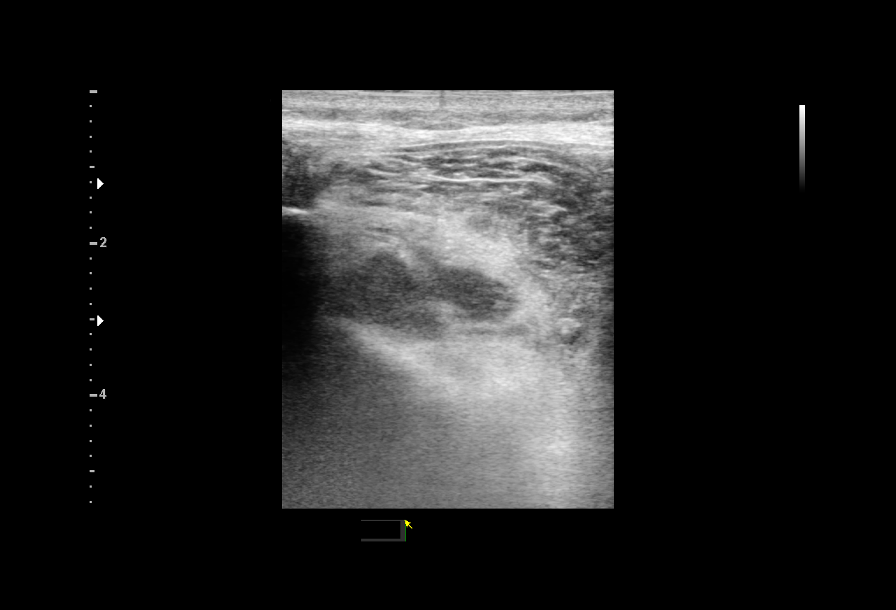
[im 10/10]
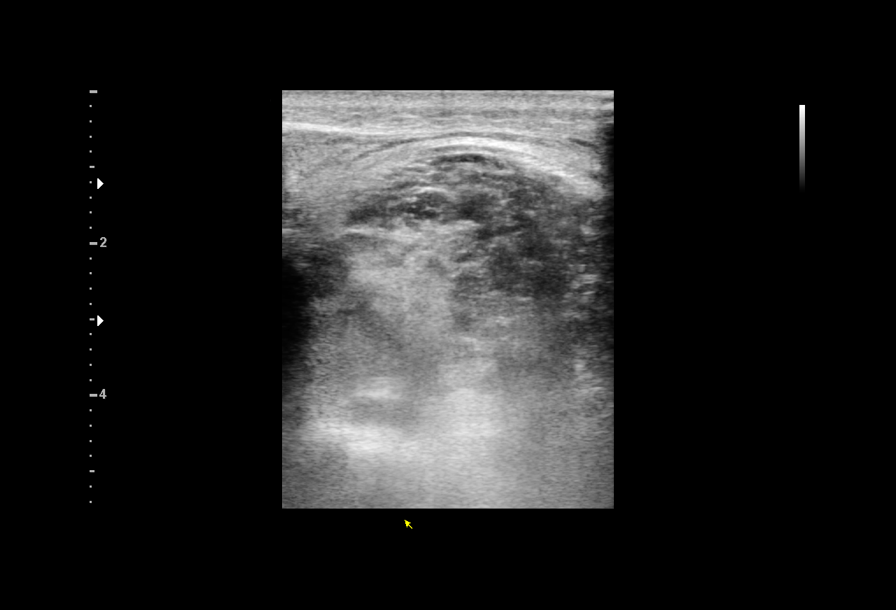

[10 of 10 positions shown; findings below may reference images not displayed]

EXAM:
Ultrasound-guided aspiration of paraspinal abscess

MEDICATIONS:
Cefazolin 2 g IV

ANESTHESIA/SEDATION:
Moderate (conscious) sedation was employed during this procedure. A
total of Versed 2 mg was administered intravenously.

Moderate Sedation Time: 18 minutes. The patient's level of
consciousness and vital signs were monitored continuously by
radiology nursing throughout the procedure under my direct
supervision.

COMPLICATIONS:
None immediate.

PROCEDURE:
Informed written consent was obtained from the patient after a
thorough discussion of the procedural risks, benefits and
alternatives. All questions were addressed. Maximal Sterile Barrier
Technique was utilized including caps, mask, sterile gowns, sterile
gloves, sterile drape, hand hygiene and skin antiseptic. A timeout
was performed prior to the initiation of the procedure.

Ultrasound evaluation of the lower lumbar paraspinal region
demonstrated multiple areas of inflammation and 1 abscess. The
overlying skin was prepped and draped in usual fashion. Sterile
ultrasound probe cover and sterile ultrasound gel were utilized
throughout the procedure.

Subcutaneous lidocaine was administered for local anesthesia. Using
continuous ultrasound guidance, 6 French Yueh needle was advanced
into the abscess in the left paraspinal region and 15 mL of purulent
material was aspirated. Samples were sent for Gram stain and
culture.

Needle was withdrawn and hemostasis achieved with 2 minutes manual
compression.

Patient the procedure well without complication.
IMPRESSION: Ultrasound-guided aspiration of left L4 paraspinal abscess as above.

## 2020-07-30 MED ORDER — LORAZEPAM 0.5 MG PO TABS
0.5000 mg | ORAL_TABLET | Freq: Once | ORAL | Status: AC
  Administered 2020-07-30: 0.5 mg via ORAL
  Filled 2020-07-30: qty 1

## 2020-07-30 MED ORDER — LACTATED RINGERS IV SOLN: INTRAVENOUS | Status: AC

## 2020-07-30 MED ORDER — MORPHINE SULFATE (PF) 4 MG/ML IV SOLN
4.0000 mg | Freq: Once | INTRAVENOUS | Status: AC
Start: 2020-07-30 — End: 2020-07-30
  Administered 2020-07-30: 4 mg via INTRAVENOUS
  Filled 2020-07-30: qty 1

## 2020-07-30 MED ORDER — MIDAZOLAM HCL 2 MG/2ML IJ SOLN
INTRAMUSCULAR | Status: AC
  Filled 2020-07-30: qty 2

## 2020-07-30 MED ORDER — SODIUM CHLORIDE 0.9 % IV SOLN
2.0000 g | Freq: Once | INTRAVENOUS | Status: DC
  Filled 2020-07-30: qty 20

## 2020-07-30 MED ORDER — NICOTINE 14 MG/24HR TD PT24
14.0000 mg | MEDICATED_PATCH | Freq: Every day | TRANSDERMAL | Status: DC
  Administered 2020-07-31 – 2020-08-03 (×4): 14 mg via TRANSDERMAL
  Filled 2020-07-30 (×5): qty 1

## 2020-07-30 MED ORDER — RIVAROXABAN 10 MG PO TABS: 10.0000 mg | ORAL_TABLET | Freq: Every day | ORAL | Status: DC

## 2020-07-30 MED ORDER — ONDANSETRON HCL 4 MG/2ML IJ SOLN
4.0000 mg | Freq: Four times a day (QID) | INTRAMUSCULAR | Status: DC | PRN
  Administered 2020-07-30: 4 mg via INTRAVENOUS
  Filled 2020-07-30: qty 2

## 2020-07-30 MED ORDER — RIVAROXABAN 10 MG PO TABS
10.0000 mg | ORAL_TABLET | Freq: Every day | ORAL | Status: DC
  Administered 2020-07-30 – 2020-07-31 (×2): 10 mg via ORAL
  Filled 2020-07-30 (×3): qty 1

## 2020-07-30 MED ORDER — VANCOMYCIN HCL 1500 MG/300ML IV SOLN
1500.0000 mg | Freq: Once | INTRAVENOUS | Status: DC
  Filled 2020-07-30: qty 300

## 2020-07-30 MED ORDER — SODIUM CHLORIDE 0.9% FLUSH: 9.0000 mL | INTRAVENOUS | Status: DC | PRN

## 2020-07-30 MED ORDER — SODIUM CHLORIDE 0.9 % IV SOLN
INTRAVENOUS | Status: AC | PRN
  Administered 2020-07-30: 250 mL via INTRAVENOUS

## 2020-07-30 MED ORDER — SODIUM CHLORIDE 0.9 % IV SOLN: 1.0000 g | Freq: Once | INTRAVENOUS | Status: DC

## 2020-07-30 MED ORDER — DIPHENHYDRAMINE HCL 12.5 MG/5ML PO ELIX
12.5000 mg | ORAL_SOLUTION | Freq: Four times a day (QID) | ORAL | Status: DC | PRN
Start: 2020-07-30 — End: 2020-08-06

## 2020-07-30 MED ORDER — MIDAZOLAM HCL 2 MG/2ML IJ SOLN
INTRAMUSCULAR | Status: AC | PRN
  Administered 2020-07-30 (×2): 1 mg via INTRAVENOUS

## 2020-07-30 MED ORDER — FENTANYL CITRATE (PF) 100 MCG/2ML IJ SOLN
INTRAMUSCULAR | Status: AC
  Filled 2020-07-30: qty 2

## 2020-07-30 MED ORDER — DIPHENHYDRAMINE HCL 50 MG/ML IJ SOLN: 12.5000 mg | Freq: Four times a day (QID) | INTRAMUSCULAR | Status: DC | PRN

## 2020-07-30 MED ORDER — CEFAZOLIN SODIUM-DEXTROSE 2-4 GM/100ML-% IV SOLN
2.0000 g | Freq: Three times a day (TID) | INTRAVENOUS | Status: DC
  Administered 2020-07-30 – 2020-08-03 (×14): 2 g via INTRAVENOUS
  Filled 2020-07-30 (×15): qty 100

## 2020-07-30 MED ORDER — NALOXONE HCL 0.4 MG/ML IJ SOLN: 0.4000 mg | INTRAMUSCULAR | Status: DC | PRN

## 2020-07-30 MED ORDER — HYDROMORPHONE 1 MG/ML IV SOLN
INTRAVENOUS | Status: DC
Start: 2020-07-30 — End: 2020-07-31
  Administered 2020-07-30: 30 mg via INTRAVENOUS
  Administered 2020-07-31: 2.1 mg via INTRAVENOUS
  Administered 2020-07-31: 4.2 mg via INTRAVENOUS
  Administered 2020-07-31: 3.3 mg via INTRAVENOUS
  Filled 2020-07-30 (×4): qty 30

## 2020-07-30 MED ORDER — LIDOCAINE HCL 1 % IJ SOLN
INTRAMUSCULAR | Status: AC
  Filled 2020-07-30: qty 20

## 2020-07-30 MED ORDER — ONDANSETRON HCL 4 MG/2ML IJ SOLN
4.0000 mg | Freq: Once | INTRAMUSCULAR | Status: AC
  Administered 2020-07-30: 4 mg via INTRAVENOUS
  Filled 2020-07-30: qty 2

## 2020-07-30 NOTE — ED Notes (Signed)
PCA documentation  Total drug 7.1 mg Total demands 32 Total delivery 22

## 2020-07-30 NOTE — ED Notes (Signed)
Breakfast ordered 

## 2020-07-30 NOTE — Progress Notes (Signed)
Chief Complaint: Patient was seen in consultation today for  Chief Complaint  Patient presents with  . Back Pain    Referring Physician(s): Dr. Heide Spark  Supervising Physician: Mir, Mauri Reading  Patient Status: [facility] - ED  History of Present Illness: George Summers is a 40 y.o. male with history of IV drug abuse (heroin; fentanyl) who  presented to the [facility] ED on 2/7 with3-day history ofsevere low back pain,progressive weakness and difficulty ambulating. Patient denied any incontinence, fevers,or numbness. Symptoms were uncontrolled with Tylenol and ibuprofen. Lower extremity strength and ambulatory function limited due to pain. Laboratory work-up notable for leukocytosis to 22.7 and elevated inflammatory markers. Subsequent imaging (MRI 07/27/20) revealed:  Paraspinal and other soft tissues: Paraspinal edema and enhancement posteriorly from L4 to sacral levels. This is greatest at the L4 level where there are peripherally enhancing abscesses. For example on the left measuring up to 2.9 cm craniocaudally (series 6, image 10). There is dorsal epidural enhancement at the L4 and L5 levels without abscess.  Interventional Radiology received a request to evaluate this patient for an image-guided aspiration of the L4 post-paraspinal fluid collection/abscess and this was approved by Dr. Lowella Dandy. However, the patient left AMA the evening of 07/27/20 but after discharge his blood cultures returned positive for MSSA. Several physicians, including Dr. Ilsa Iha with ID, called the patient and left messages for him with this news. The patient returned to the hospital 07/30/20 for treatment.     Past Medical History:  Diagnosis Date  . GI (gastrointestinal bleed)    PT reports a stomach ulcer  . 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 TONSILLECTOMY      Allergies: Patient has no known allergies.  Medications: Prior to Admission medications    Medication Sig Start Date End Date Taking? Authorizing Provider  ibuprofen (ADVIL) 200 MG tablet Take 1,200 mg by mouth every 6 (six) hours as needed for headache or moderate pain.    [provider]  methocarbamol (ROBAXIN) 500 MG tablet Take 1 tablet (500 mg total) by mouth 2 (two) times daily. 07/27/20   Gailen Shelter, PA  naloxone [facility]) 2 MG/2ML injection Place 1 mL into the nose as needed. Opioid overdose. 09/22/19   [provider]     No family history on file.  Social History   Socioeconomic History  . Marital status: Single    Spouse name: Not on file  . Number of children: Not on file  . Years of education: Not on file  . Highest education level: Not on file  Occupational History  . Not on file  Tobacco Use  . Smoking status: Current Every Day Smoker    Types: Cigarettes  . Smokeless tobacco: Never Used  Substance and Sexual Activity  . Alcohol use: Yes    Comment: Pt drank last night.  . Drug use: Yes    Types: Marijuana    Comment: Last smoke was last night 06-20-15  . Sexual activity: Not on file  Other Topics Concern  . Not on file  Social History Narrative  . Not on file   Social Determinants of Health   Financial Resource Strain: Not on file  Food Insecurity: Not on file  Transportation Needs: Not on file  Physical Activity: Not on file  Stress: Not on file  Social Connections: Not on file    Review of Systems: A 12 point ROS discussed and pertinent positives  are indicated in the HPI above.  All other systems are negative.  Review of Systems  Constitutional: Positive for appetite change and fatigue.  Respiratory: Negative for cough and shortness of breath.   Cardiovascular: Positive for chest pain. Negative for leg swelling.  Gastrointestinal: Positive for abdominal pain. Negative for diarrhea, nausea and vomiting.  Musculoskeletal: Positive for back pain.  Neurological: Positive for headaches.  Psychiatric/Behavioral: The  patient is nervous/anxious.     Vital Signs: BP 131/86   Pulse (!) 105   Temp 98.2 F (36.8 C) (Oral)   Resp 20   Ht 5\' 10"  (1.778 m)   Wt 158 lb 11.7 oz (72 kg)   SpO2 95%   BMI 22.78 kg/m   Physical Exam Constitutional:      General: He is not in acute distress.    Appearance: He is not ill-appearing.  HENT:     Mouth/Throat:     Mouth: Mucous membranes are moist.     Pharynx: Oropharynx is clear.  Cardiovascular:     Rate and Rhythm: Regular rhythm. Tachycardia present.     Pulses: Normal pulses.     Heart sounds: Normal heart sounds.  Pulmonary:     Effort: Pulmonary effort is normal.     Breath sounds: Normal breath sounds.  Abdominal:     General: Bowel sounds are normal.     Palpations: Abdomen is soft.     Tenderness: There is abdominal tenderness.     Comments: generalized  Musculoskeletal:        General: Normal range of motion.  Skin:    General: Skin is warm and dry.  Neurological:     Mental Status: He is alert and oriented to person, place, and time.  Psychiatric:        Mood and Affect: Mood is anxious.     Imaging: MR THORACIC SPINE W CONTRAST  Result Date: 07/27/2020 CLINICAL DATA:  Severe low back pain, IV drug user EXAM: MRI THORACIC AND LUMBAR SPINE WITHOUT AND WITH CONTRAST TECHNIQUE: Multiplanar and multiecho pulse sequences of the thoracic and lumbar spine were obtained without and with intravenous contrast. CONTRAST:  22mL GADAVIST GADOBUTROL 1 MMOL/ML IV SOLN COMPARISON:  None. FINDINGS: MRI THORACIC SPINE Motion artifact is present. Alignment: Preserved Vertebrae: Endplate irregularity of T6-T7 through T11-T12. Vertebral body heights are otherwise maintained. Minimal marrow edema and enhancement underlying the T9 superior endplate. Cord:  No abnormal signal.  No abnormal intrathecal enhancement. Paraspinal and other soft tissues: Unremarkable. Disc levels: There is no disc edema or abnormal enhancement. No significant degenerative stenosis. MRI  LUMBAR SPINE Motion artifact is present. Segmentation: Standard. Alignment:  Trace anterolisthesis at L4-L5. Vertebrae: Vertebral body heights are maintained. There is minimal endplate marrow edema at L4-L5 likely on a degenerative basis. There is marrow edema associated with the inferior right L4 facet. Conus medullaris: Extends to the L1 level and appears normal. No abnormal intrathecal enhancement. Paraspinal and other soft tissues: Paraspinal edema and enhancement posteriorly from L4 to sacral levels. This is greatest at the L4 level where there are peripherally enhancing abscesses. For example on the left measuring up to 2.9 cm craniocaudally (series 6, image 10). There is dorsal epidural enhancement at the L4 and L5 levels without abscess. Disc levels: No disc edema or enhancement. There is disc height loss at L4-L5 and L5-S1. Disc bulges are present at these levels with facet arthropathy. No high-grade stenosis. IMPRESSION: Motion degraded studies. Lower lumbar posterior paraspinal myositis with abscess formation centered  at the L4 level. Adjacent inferior right L4 facet marrow edema likely reflects osteomyelitis. Dorsal epidural enhancement is present at the L4 and L5 levels without abscess formation. Minimal marrow edema and enhancement underlying the T9 superior endplate. Favored to be on a degenerative basis. Alternatively, could reflect early changes of infection. Electronically Signed   By: Guadlupe Spanish M.D.   On: 07/27/2020 08:30   MR Lumbar Spine W Wo Contrast  Result Date: 07/27/2020 CLINICAL DATA:  Severe low back pain, IV drug user EXAM: MRI THORACIC AND LUMBAR SPINE WITHOUT AND WITH CONTRAST TECHNIQUE: Multiplanar and multiecho pulse sequences of the thoracic and lumbar spine were obtained without and with intravenous contrast. CONTRAST:  72mL GADAVIST GADOBUTROL 1 MMOL/ML IV SOLN COMPARISON:  None. FINDINGS: MRI THORACIC SPINE Motion artifact is present. Alignment: Preserved Vertebrae:  Endplate irregularity of T6-T7 through T11-T12. Vertebral body heights are otherwise maintained. Minimal marrow edema and enhancement underlying the T9 superior endplate. Cord:  No abnormal signal.  No abnormal intrathecal enhancement. Paraspinal and other soft tissues: Unremarkable. Disc levels: There is no disc edema or abnormal enhancement. No significant degenerative stenosis. MRI LUMBAR SPINE Motion artifact is present. Segmentation: Standard. Alignment:  Trace anterolisthesis at L4-L5. Vertebrae: Vertebral body heights are maintained. There is minimal endplate marrow edema at L4-L5 likely on a degenerative basis. There is marrow edema associated with the inferior right L4 facet. Conus medullaris: Extends to the L1 level and appears normal. No abnormal intrathecal enhancement. Paraspinal and other soft tissues: Paraspinal edema and enhancement posteriorly from L4 to sacral levels. This is greatest at the L4 level where there are peripherally enhancing abscesses. For example on the left measuring up to 2.9 cm craniocaudally (series 6, image 10). There is dorsal epidural enhancement at the L4 and L5 levels without abscess. Disc levels: No disc edema or enhancement. There is disc height loss at L4-L5 and L5-S1. Disc bulges are present at these levels with facet arthropathy. No high-grade stenosis. IMPRESSION: Motion degraded studies. 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 Adjacent inferior right L4 facet marrow edema likely reflects osteomyelitis. Dorsal epidural enhancement is present at the L4 and L5 levels without abscess formation. Minimal marrow edema and enhancement underlying the T9 superior endplate. Favored to be on a degenerative basis. Alternatively, could reflect early changes of infection. Electronically Signed   By: Guadlupe Spanish M.D.   On: 07/27/2020 08:30   DG CHEST PORT 1 VIEW  Result Date: 07/30/2020 CLINICAL DATA:  40 year old male with  cough and bacteremia. Patient uncooperative. EXAM: PORTABLE CHEST 1 VIEW COMPARISON:  Chest radiographs 07/03/2013 and earlier. FINDINGS: Portable rotated supine view at 0444 hours. The lower chest is not completely visible. Lung volumes appear stable and at the upper limits of normal. Mediastinal contours are within normal limits. Visualized tracheal air column is within normal limits. No pneumothorax, pulmonary edema or focal pulmonary opacity on this image. No acute osseous abnormality identified. IMPRESSION: Decreased patient cooperation resulting in rotated view not fully including the lung bases. No acute cardiopulmonary abnormality identified. Electronically Signed   By: Odessa Fleming M.D.   On: 07/30/2020 05:10   ECHOCARDIOGRAM COMPLETE  Result Date: 07/27/2020    ECHOCARDIOGRAM REPORT   Patient Name:   DEMETREE BOORAS Date of Exam: 07/27/2020 Medical Rec #:  [id number]       Height:       70.0 in Accession #:    [id number]      Weight:  150.0 lb Date of Birth:  [birth year]       BSA:          1.847 m Patient Age:    39 years        BP:           112/68 mmHg Patient Gender: M               HR:           117 bpm. Exam Location:  Inpatient Procedure: 2D Echo Indications:    Bacteremia  History:        Patient has no prior history of Echocardiogram examinations. IV                 Drug User.  Sonographer:    Thurman Coyerasey Kirkpatrick RDCS (AE) Referring Phys: [id number] Terrilee FilesMICHAEL C BUTLER  Sonographer Comments: Image acquisition challenging due to uncooperative patient. IMPRESSIONS  1. Left ventricular ejection fraction, by estimation, is 55 to 60%. The left ventricle has normal function. The left ventricle has no regional wall motion abnormalities. Left ventricular diastolic parameters were normal.  2. Right ventricular systolic function is normal. The right ventricular size is normal.  3. The mitral valve is normal in structure. No evidence of mitral valve regurgitation. No evidence of mitral stenosis.  4. The aortic valve  has an indeterminant number of cusps. Aortic valve regurgitation is not visualized. No aortic stenosis is present.  5. The inferior vena cava is normal in size with greater than 50% respiratory variability, suggesting right atrial pressure of 3 mmHg. FINDINGS  Left Ventricle: Left ventricular ejection fraction, by estimation, is 55 to 60%. The left ventricle has normal function. The left ventricle has no regional wall motion abnormalities. The left ventricular internal cavity size was normal in size. There is  no left ventricular hypertrophy. Left ventricular diastolic parameters were normal. Right Ventricle: The right ventricular size is normal.Right ventricular systolic function is normal. Left Atrium: Left atrial size was normal in size. Right Atrium: Right atrial size was normal in size. Pericardium: There is no evidence of pericardial effusion. Mitral Valve: The mitral valve is normal in structure. No evidence of mitral valve regurgitation. No evidence of mitral valve stenosis. Tricuspid Valve: The tricuspid valve is normal in structure. Tricuspid valve regurgitation is trivial. No evidence of tricuspid stenosis. Aortic Valve: The aortic valve has an indeterminant number of cusps. Aortic valve regurgitation is not visualized. No aortic stenosis is present. Pulmonic Valve: The pulmonic valve was not well visualized. Pulmonic valve regurgitation is not visualized. No evidence of pulmonic stenosis. Aorta: The aortic root is normal in size and structure. Venous: The inferior vena cava is normal in size with greater than 50% respiratory variability, suggesting right atrial pressure of 3 mmHg. IAS/Shunts: The interatrial septum is aneurysmal. No atrial level shunt detected by color flow Doppler.  LEFT VENTRICLE PLAX 2D LVIDd:         4.70 cm LVIDs:         3.30 cm LV PW:         0.80 cm LV IVS:        0.70 cm LVOT diam:     2.20 cm LVOT Area:     3.80 cm  RIGHT VENTRICLE TAPSE (M-mode): 1.5 cm LEFT ATRIUM              Index       RIGHT ATRIUM           Index LA diam:  2.70 cm 1.46 cm/m  RA Area:     11.20 cm LA Vol (A2C):   19.9 ml 10.77 ml/m RA Volume:   21.90 ml  11.86 ml/m LA Vol (A4C):   12.9 ml 6.98 ml/m LA Biplane Vol: 17.1 ml 9.26 ml/m   AORTA Ao Root diam: 3.20 cm  SHUNTS Systemic Diam: 2.20 cm Olga Millers MD Electronically signed by Olga Millers MD Signature Date/Time: 07/27/2020/1:53:31 PM    Final     Labs:  CBC: Recent Labs    07/27/20 0430 07/30/20 0151 07/30/20 0615  WBC 22.7* 15.3* 16.3*  HGB 15.8 13.9 14.2  HCT 48.9 40.7 43.1  PLT 358 407* 411*    COAGS: Recent Labs    07/27/20 1340  INR 1.1    BMP: Recent Labs    07/27/20 0430 07/30/20 0151 07/30/20 0615  NA 137 133* 133*  K 4.2 4.1 3.9  CL 100 94* 95*  CO2 25 27 26   GLUCOSE 127* 100* 147*  BUN 10 17 10   CALCIUM 9.2 8.6* 8.6*  CREATININE 0.68 0.63 0.65  GFRNONAA >60 >60 >60    LIVER FUNCTION TESTS: Recent Labs    07/30/20 0151 07/30/20 0615  BILITOT 0.4 0.3  AST 111* 130*  ALT 130* 140*  ALKPHOS 70 69  PROT 6.3* 6.3*  ALBUMIN 2.6* 2.5*    TUMOR MARKERS: No results for input(s): AFPTM, CEA, CA199, CHROMGRNA in the last 8760 hours.  Assessment and Plan:  L4 Paraspinal fluid collection: 09/27/20, 40 year old male, is tentatively scheduled for an image-guided L4 paraspinal fluid collection and aspiration. He is currently NPO but he did eat breakfast.   Risks and benefits discussed with the patient including bleeding, infection, damage to adjacent structures and sepsis.  All of the patient's questions were answered, patient is agreeable to proceed.  Consent signed and in IR  Thank you for this interesting consult.  I greatly enjoyed meeting DORRIEN GRUNDER and look forward to participating in their care.  A copy of this report was sent to the requesting provider on this date.  Electronically Signed: 30, AGACNP-BC [phone] 07/30/2020, 9:19 AM   I spent a  total of 20 Minutes    in face to face in clinical consultation, greater than 50% of which was counseling/coordinating care for L4 paraspinal fluid collection aspiration.

## 2020-07-30 NOTE — Progress Notes (Signed)
Received pt from ED in stretcher accompanied by staff, Pt alert/oriented in no apparent distress. Situated/orientated to room/equipments. Welcome guide/menu provided to pt with instructions, pt verbalilzed understanding of instructions. No complaints voiced. Hospital valuables policy has been discuss.Per pt he lost his cellphone?? while in MRI check in fromt of NT and ED staff in room and cellphone nowhere to be found. Charge nurse made aware.3 side rails up and call bell/room phone within reach, and bed in lowest position and all wheels locked.
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
## 2020-07-30 NOTE — ED Notes (Signed)
PCA Pump:  Total drug: 4.4mg  Total Demands:19 Delivered: 13

## 2020-07-30 NOTE — Procedures (Signed)
Interventional Radiology Procedure Note  Procedure: Para-spinal abscess aspiration  Indication: Para-spinal abscess  Findings: Please refer to procedural dictation for full description.  Complications: None  EBL: < 10 mL  Acquanetta Belling, MD [phone]

## 2020-07-30 NOTE — ED Notes (Signed)
Report given to Jackie RN

## 2020-07-30 NOTE — ED Notes (Signed)
Pt transferred to IR 

## 2020-07-30 NOTE — ED Notes (Signed)
Patient transported to MRI 

## 2020-07-30 NOTE — ED Notes (Signed)
PCA pump pt documentation  Total drug 1.2mg   Total demand 4  Total delivered 4

## 2020-07-30 NOTE — ED Notes (Signed)
Pt states improved pain with PCA pump. Pt provided juice and crackers.

## 2020-07-30 NOTE — Progress Notes (Signed)
Subjective: HD 1 Overnight, patient admitted for MSSA bacteremia  This morning, patient evaluated at bedside. He reports ongoing back pain and chest pain. He is slightly agitated with questioning. Endorses back pain with inspiration. Also notes right hip pain. He feels like his back pain is spreading.  Endorses feeling "hot". Requesting to decrease rate of fluids due to frequent urination.   Objective:  Vital signs in last 24 hours: Vitals:   07/30/20 0400 07/30/20 0513 07/30/20 0800 07/30/20 0803  BP: 121/75 127/87 131/86   Pulse: (!) 112 (!) 107 (!) 105   Resp:  20 20 20   Temp:      TempSrc:      SpO2: 97% 95% 95% 95%  Weight:      Height:       CBC Latest Ref Rng & Units 07/30/2020 07/30/2020 07/27/2020  WBC 4.0 - 10.5 K/uL 16.3(H) 15.3(H) 22.7(H)  Hemoglobin 13.0 - 17.0 g/dL 09/24/2020 65.0 35.4  Hematocrit 39.0 - 52.0 % 43.1 40.7 48.9  Platelets 150 - 400 K/uL 411(H) 407(H) 358   CMP Latest Ref Rng & Units 07/30/2020 07/30/2020 07/27/2020  Glucose 70 - 99 mg/dL 09/24/2020) 812(X) 517(G)  BUN 6 - 20 mg/dL 10 17 10   Creatinine 0.61 - 1.24 mg/dL 017(C 9.44  Sodium 135 - 145 mmol/L 133(L) 133(L) 137  Potassium 3.5 - 5.1 mmol/L 3.9 4.1 4.2  Chloride 98 - 111 mmol/L 95(L) 94(L) 100  CO2 22 - 32 mmol/L 26 27 25   Calcium 8.9 - 10.3 mg/dL 9.67) 5.91) 9.2  Total Protein 6.5 - 8.1 g/dL 6.3(L) 6.3(L) -  Total Bilirubin 0.3 - 1.2 mg/dL 0.3 0.4 -  Alkaline Phos 38 - 126 U/L 69 70 -  AST 15 - 41 U/L 130(H) 111(H) -  ALT 0 - 44 U/L 140(H) 130(H) -   Physical Exam Constitutional:      General: He is not in acute distress.    Comments: Slightly agitated with questions   HENT:     Head: Normocephalic.  Eyes:     General:        Right eye: No discharge.        Left eye: No discharge.  Cardiovascular:     Rate and Rhythm: Regular rhythm. Tachycardia present.  Pulmonary:     Effort: Pulmonary effort is normal. No respiratory distress.  Abdominal:     Tenderness: There is abdominal  tenderness (diffuse tenderness to palpation ).  Musculoskeletal:     Right lower leg: No edema.     Left lower leg: No edema.  Skin:    General: Skin is warm.  Neurological:     Mental Status: He is alert.     Assessment/Plan: George Summers is a 40 year old with IV drug use history who was admitted for back pain, found to have a lumbar paraspinal myositis with abscess and MSSA bacteremia, currently treated with IV cefazolin. ID and IR consulted.  Active Problems:   MSSA bacteremia  MSSA Bacteremia  Lumbar Paraspinal Myositis with Abscess  Osteomyelitis L4  -MRI of the T/L spine showed lower lumbar posterior paraspinal myositis with abscess formation and likely osteomyelitis at the L4 level. Also noted to have minimal marrow edema and enhancement underlying T9 superior endplate which may represent early infection. IR consulted for CT-guided aspiration. -ID consulted for MSSA bacteremia. Currently on IV cefazolin. Last echo done on 2/8 showed trivial tricuspid regurgitation. Given his IV drug use, high suspicion of infective endocarditis. Will obtain TEE to evaluate for  vegetations -Patient also endorses right hip pain. Will obtain MRI to rule out osteomyelitis: Septic arthritis in the setting of MSSA bacteremia. - Appreciate ID recommendations - TEE scheduled on Monday - Consulted IR for drainage  - Cefazolin 2g IV q8 hours  - Xarelto 10mg  daily  - LR @ 182mL/hr  - Dilaudid PCA pump 1mg  q4 hrs  - Ondansetron 4mg  q6hrs PRN   Polysubstance Abuse  Patient endorses using 0.1- 0.2g fentanyl daily. Last used ~22 hours ago. Continue Dilaudid PCA pump at this time given ongoing infection and abscess. Will transition to as needed Dilaudid when symptoms improve. Plan to start methadone during this admission. - Dilaudid PCA pump 1mg  q4 hrs w/ Naloxone PRN - Ondansetron 4mg  q6hrs PRN - Diphenhydramine 12.5mg  IV q6hrs PRN  - Nicotine 14mg  patch daily   Atypical chest pain  Chronic  Cough Possible Hemoptysis  Patient states he has had a productive cough that has remained constant over the past 4 months with pink sputum. Also complains of atypical chest pain. Chest x-ray was unremarkable. Differentials include septic pulmonary emboli versus undiagnosed COPD in setting of prolonged smoking history.  - Pending TEE - Consider CT chest if symptoms worsen    Elevated Liver Enzymes  AST 111, ALT 130, ALP and bilirubin within normal limits. Platelets 407, INR 1.1. Patient has history of heavy alcohol use as well as IVDU although states he has not consumed alcohol in 6 months. HIV and hepatitis (hep B and hep C) labs negative in 2/8. Likely in setting of bacteremia. If not improved after treating infection, will consider checking liver.  - Monitor CMP    Dysuria Patient states that he had dysuria and urinary urgency that preceded his current symptoms. Denies troubles with urinary incontinence or initiating urination. Denies flank pain concerning for pyelonephritis. UA normal.   - Continue to monitor symptoms   Code status: Full Code  Diet: Regular  DVT PPx: On Xarelto 10mg  daily IVF: LR @ 157mL/hr   Prior to Admission Living Arrangement: Home Anticipated Discharge Location: to be determined Barriers to Discharge: medical treatment    , DO 07/30/2020, 9:10 AM Pager: [phone] After 5pm on weekdays and 1pm on weekends: On Call pager [phone]

## 2020-07-30 NOTE — H&P (Signed)
Date: 07/30/2020               Patient Name:  George Summers MRN: [id number]  DOB: [birth year] Age / Sex: 40 y.o., male   PCP: Patient, No Pcp Per         Medical Service: Internal Medicine Teaching Service         Attending Physician: Dr. Earl Lagos    First Contact: Dr. Cyndie Chime Pager: [phone]  Second Contact: Dr. Mcarthur Rossetti Pager: [phone]       After Hours (After 5p/  First Contact Pager: [phone]  weekends / holidays): Second Contact Pager: [phone]   Chief Complaint: Back pain  History of Present Illness:   [name] is a 40 y.o. gentleman with hx IVDU and TUD without other PMHx or substance use, returning to the hospital for treatment of MSSA bacteremia. He first noticed low back pain that started about 6 days ago. His pain started over his sacrum and progressed up his spine. He describes the pain as "in his bones" associated with swelling of his surrounding musculature, L > R and electric-type pain and numbness radiating down both of his legs. He has had weakness of his legs with multiple falls and he had two episodes of witnessed syncope while resting watching television with a friend on Tuesday. He has had intermittent subjective fevers and chills over the past 2 days with diaphoresis and decreased PO intake today.   He presented to [facility] 07/27/20 for the same and his blood cultures returned positive for MSSA. He was found to have paraspinal myositis with abscess and was evaluated by neurosurgery who recommended IR aspiration; however, the patient had some "warrants he needed to sort out" and left AMA; however, is eager to have his infection treated currently. He states he uses IV fentanyl (states heroine is no longer available). He has cut back from 2-3g/day to 0.1-0.2g. He states he is trying to stop using. He has been treated with methadone in the past, although is hopeful not to take any medications in the future as he does not want to be dependent on them and prefers a more  homeopathic approach. Last used IV fentanyl about 22 hours ago and endorses intermittent sternal CP he describes as "hollow" that he states is consistent with his prior withdrawals.   Notes that he has had dysuria and urgency as well as right wrist lesion that proceeded all of the above. Has deep lower abdominal pain not worse with palpation but denies any flank pain. Has taken 72 ibuprofen pills since he was last in the ED and endorses diffuse abdominal discomfort and constipation with hx of rectal bleeding although denies any current dark or bloody stools or other bowel changes. Notes he has had a productive cough for 4 months that has remained persistent, with pink "chunky" sputum. Denies SOB outside of pain.   ED Course: Patient was given morphine 4mg  IV and Zofran 4mg  IV x 1.   Home Medications: Ibuprofen  Allergies: Allergies as of 07/29/2020  . (No Known Allergies)   Past Medical History:  Diagnosis Date  . GI (gastrointestinal bleed)    PT reports a stomach ulcer  . 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  Negative for heart disease or clotting disorders.   Social History:  Patient lives at his friend's house. States that his family has all passed and denies having anyone else he is close to. Formerly  worked in [employer].  Smokes cigarettes (~.5-1 PPD) for many years.  Stopped drinking alcohol 6 months ago.  Uses ~0.1-0.2g IV fentanyl daily.   Review of Systems: A complete ROS was negative except as per HPI.   Physical Exam: Blood pressure 106/68, pulse (!) 108, temperature 98.2 F (36.8 C), temperature source Oral, resp. rate 17, height 5\' 10"  (1.778 m), weight 72 kg, SpO2 98 %.  General: Patient appears thin, uncomfortable, and chronically ill. No acute distress. Eyes: Sclera non-icteric. No conjunctival injection.  HENT: Neck is supple. MMM. Poor dentition. No nasal discharge. Respiratory: RUL initially with rhonchi,  which cleared with cough. Lung sounds otherwise CTA without rales or wheezing. No tachypnea or increased work of breathing.  Cardiovascular: Rate is borderline tachycardic. Rhythm is regular. No murmurs, rubs, or gallops. No lower extremity edema. Extremities warm and well-perfused.  Neurological: Patient is alert and oriented x 3. CN II-XII intact.  Musculoskeletal: There is a ~7x5cm area of swelling over the left paravertebral musculature with overlying erythema. Patient refused examination of lower thoracic and lumbar spine otherwise due to pain. Cervical and upper thoracic spine without TTP or paravertebral swelling. Very mild cachexia present throughout. 5/5 strength in bilateral upper extremities. Unable to assess strength in lower extremities or right hip 2/2 pain.  Abdominal: Soft and non-tender to palpation. Bowel sounds intact. No rebound or guarding. Skin: Appears mildly jaundiced. There is swelling overlying venous injection site in the right antecubital fossa and a pea-sized healing lesion overlying the right wrist without drainage, excess warmth or erythema. Mild erythema overlying lower lumbar spine.  Psych: Intermittently tearful. Otherwise normal affect. Normal tone of voice. No SI.   EKG from 07/28/20: Sinus tachycardia with LVH and right atrial enlargement. No T wave inversions or ST segment changes to suggest ischemia. Normal QTc interval.   Assessment & Plan by Problem: Active Problems:   * No active hospital problems. *  # MSSA Bacteremia  # Lumbar Paraspinal Myositis with Abscess  # Osteomyelitis L4  Patient has had low back pain/swelling x 6 days, now with R hip pain, intermittent syncope, and chills found 07/27/20 to have MSSA bacteremia. Patient is tachycardic but otherwise HDS. WBC 15.3. MRI of the T/L spine showed lower lumbar posterior paraspinal myositis with abscess formation and likely osteomyelitis at the L4 level. Also noted to have minimal marrow edema and enhancement  underlying T9 superior endplate which may represent early infection. Neurosurgery were consulted at that time who recommended IR drainage. IR agreed that posterior paraspinal collections were amenable to CT-guided aspiration; however, patient left AMA prior to this.   - Consulted IR  - Start Cefazolin 2g IV q8 hours  - Xarelto 10mg  daily  - LR @ 17mL/hr  - Dilaudid PCA pump 1mg  q4 hrs  - Ondansetron 4mg  q6hrs PRN - Check Lactic Acid  - Monitor CBC, CMP   # Dysuria Patient states that he had dysuria and urinary urgency that preceded his current symptoms. Denies troubles with urinary incontinence or initiating urination. Denies flank pain concerning for pyelonephritis.   - Check urinalysis to r/o infection  # Chronic Cough # Possible Hemoptysis  Patient states he has had a productive cough that has remained constant over the past 4 months with pink sputum. Denies SOB outside of his pain. Viral testing negative 07/27/20. Consider possibility of septic pulmonary emboli in setting of CP and possible hemoptysis. May also represent undiagnosed COPD in setting of prolonged smoking history.   - Check portable CXR   #  Atypical Chest Pain # Concern for Opioid Withdrawal Patient endorses intermittent substernal chest pain he describes as a "cold, hollow" feeling that is not pleuritic nor exertional. Endorses history of similar CP in the past due to substance withdrawal. Patient has no cardiac murmurs or other signs/symptoms of endocarditis, although was noted to have trivial TR on ECHO 07/27/20 (without vegetation noted).   - see below - Will require morning ID consult  - Consider TEE for further evaluation   # Polysubstance Abuse  Patient endorses using 0.1-0.2g fentanyl daily. Last used ~22 hours ago. Endorses CP but denies nausea or vomiting consistent with prior withdrawal with no improvement in pain s/p 4mg  IV morphine in the ED. States he is actively trying to cut back and is currently okay  with receiving IV pain medication while in the hospital. Discussed possible transition to methadone once symptoms begin to improve and patient agrees with plan. Continues to smoke ~0.5-1 PPD cigarettes.   - Dilaudid PCA pump 1mg  q4 hrs w/ Naloxone PRN - Ondansetron 4mg  q6hrs PRN - Diphenhydramine 12.5mg  IV q6hrs PRN  - Nicotine 14mg  patch daily   # Elevated Liver Enzymes  AST 111, ALT 130, ALP and bilirubin within normal limits. Sodium 133, platelets 407, INR 1.1. Patient has history of heavy alcohol use as well as IVDU although states he has not consumed alcohol in 6 months. HIV and hepatitis labs unremarkable last visit. Likely in setting of bacteremia. No acute liver failure.   - Check morning CMP   Code status: Full Code  Diet: Regular  DVT PPx: On Xarelto 10mg  daily IVF: LR @ 185mL/hr  Dispo: Admit patient to Inpatient with expected length of stay greater than 2 midnights.  Signed: , MD 07/30/2020, 1:52 AM  Pager: [phone] After 5pm on weekdays and 1pm on weekends: On Call pager: [phone]

## 2020-07-30 NOTE — ED Provider Notes (Addendum)
[facility] EMERGENCY DEPARTMENT Provider Note   CSN: [id number] Arrival date & time: 07/29/20  2022     History Chief Complaint  Patient presents with  . Back Pain    CEBERT DETTMANN is a 40 y.o. male.  Patient is a 40 year old male with history of IV drug abuse.  He was diagnosed 2 days ago with a paraspinal abscess/discitis at L4.  Patient was to be admitted for this, then left AGAINST MEDICAL ADVICE due to outstanding warrants he had to take care of.  He returns today stating that these warrants have been tended to and would like to go along with the previously recommended plan of care which consisted of IV antibiotics and IR drainage of the abscess.  Patient describes ongoing pain in his low back that radiates into his legs.  He denies weakness.  He denies bowel or bladder complaints.  The history is provided by the patient.  Back Pain Location:  Lumbar spine Quality:  Aching Radiates to:  R posterior upper leg and L posterior upper leg Pain severity:  Severe Onset quality:  Sudden Timing:  Constant Progression:  Worsening Relieved by:  Nothing Worsened by:  Palpation and movement      Past Medical History:  Diagnosis Date  . GI (gastrointestinal bleed)    PT reports a stomach ulcer  . 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 Paraspinal abscess (HCC) 07/27/2020  . Acute midline low back pain   . Epidural abscess   . Heroin withdrawal (HCC)     Past Surgical History:  Procedure Laterality Date  . TONSILLECTOMY         No family history on file.  Social History   Tobacco Use  . Smoking status: Current Every Day Smoker    Types: Cigarettes  . Smokeless tobacco: Never Used  Substance Use Topics  . Alcohol use: Yes    Comment: Pt drank last night.  . Drug use: Yes    Types: Marijuana    Comment: Last smoke was last night 06-20-15    Home Medications Prior to Admission medications    Medication Sig Start Date End Date Taking? Authorizing Provider  ibuprofen (ADVIL) 200 MG tablet Take 1,200 mg by mouth every 6 (six) hours as needed for headache or moderate pain.    [provider]  methocarbamol (ROBAXIN) 500 MG tablet Take 1 tablet (500 mg total) by mouth 2 (two) times daily. 07/27/20   Gailen Shelter, PA  naloxone [facility]) 2 MG/2ML injection Place 1 mL into the nose as needed. Opioid overdose. 09/22/19   [provider]    Allergies    Patient has no known allergies.  Review of Systems   Review of Systems  Musculoskeletal: Positive for back pain.  All other systems reviewed and are negative.   Physical Exam Updated Vital Signs BP 106/68 (BP Location: Left Arm)   Pulse (!) 108   Temp 98.2 F (36.8 C) (Oral)   Resp 17   Ht 5\' 10"  (1.778 m)   Wt 72 kg   SpO2 98%   BMI 22.78 kg/m   Physical Exam Vitals and nursing note reviewed.  Constitutional:      General: He is not in acute distress.    Appearance: He is well-developed and well-nourished. He is not diaphoretic.  HENT:     Head: Normocephalic and atraumatic.     Mouth/Throat:  Mouth: Oropharynx is clear and moist.  Cardiovascular:     Rate and Rhythm: Normal rate and regular rhythm.     Heart sounds: No murmur heard. No friction rub.  Pulmonary:     Effort: Pulmonary effort is normal. No respiratory distress.     Breath sounds: Normal breath sounds. No wheezing or rales.  Abdominal:     General: Bowel sounds are normal. There is no distension.     Palpations: Abdomen is soft.     Tenderness: There is no abdominal tenderness.  Musculoskeletal:        General: No edema. Normal range of motion.     Cervical back: Normal range of motion and neck supple.     Comments: There is tenderness to palpation in the soft tissues of the mid lumbar region.  There is no redness, erythema, or bony tenderness.  Skin:    General: Skin is warm and dry.  Neurological:     Mental Status: He is  alert and oriented to person, place, and time.     Coordination: Coordination normal.     ED Results / Procedures / Treatments   Labs (all labs ordered are listed, but only abnormal results are displayed) Labs Reviewed  COMPREHENSIVE METABOLIC PANEL  CBC WITH DIFFERENTIAL/PLATELET    EKG None  Radiology No results found.  Procedures Procedures   Medications Ordered in ED Medications  cefTRIAXone (ROCEPHIN) 1 g in sodium chloride 0.9 % 100 mL IVPB (has no administration in time range)  vancomycin (VANCOREADY) IVPB 1000 mg/200 mL (has no administration in time range)    ED Course  I have reviewed the triage vital signs and the nursing notes.  Pertinent labs & imaging results that were available during my care of the patient were reviewed by me and considered in my medical decision making (see chart for details).    MDM Rules/Calculators/A&P  Patient is a 40 year old male with history of IV drug abuse and recent diagnosis of discitis/spinal abscess.  Patient was to be admitted, then left AGAINST MEDICAL ADVICE.  He returns today stating that he is taking care of his outstanding warrants and is willing to go through with the previously recommended course of treatment.  Care has been discussed with the internal medicine teaching service and patient will be admitted.  Vancomycin and Rocephin ordered as previously initiated.  CRITICAL CARE Performed by: Geoffery Lyons Total critical care time: 35 minutes Critical care time was exclusive of separately billable procedures and treating other patients. Critical care was necessary to treat or prevent imminent or life-threatening deterioration. Critical care was time spent personally by me on the following activities: development of treatment plan with patient and/or surrogate as well as nursing, discussions with consultants, evaluation of patient's response to treatment, examination of patient, obtaining history from patient or  surrogate, ordering and performing treatments and interventions, ordering and review of laboratory studies, ordering and review of radiographic studies, pulse oximetry and re-evaluation of patient's condition.   Final Clinical Impression(s) / ED Diagnoses Final diagnoses:  None    Rx / DC Orders ED Discharge Orders    None       Geoffery Lyons, MD 07/30/20 [id number]    Geoffery Lyons, MD 08/12/20 [phone]

## 2020-07-30 NOTE — Plan of Care (Signed)
  Problem: Activity: Goal: Risk for activity intolerance will decrease Outcome: Progressing   Problem: Nutrition: Goal: Adequate nutrition will be maintained Outcome: Progressing   Problem: Coping: Goal: Level of anxiety will decrease Outcome: Progressing   Problem: Pain Managment: Goal: General experience of comfort will improve Outcome: Progressing   Problem: Safety: Goal: Ability to remain free from injury will improve Outcome: Progressing   

## 2020-07-31 ENCOUNTER — Inpatient Hospital Stay (HOSPITAL_COMMUNITY): Payer: Self-pay

## 2020-07-31 DIAGNOSIS — R0789 Other chest pain: Secondary | ICD-10-CM

## 2020-07-31 DIAGNOSIS — T50901A Poisoning by unspecified drugs, medicaments and biological substances, accidental (unintentional), initial encounter: Secondary | ICD-10-CM

## 2020-07-31 DIAGNOSIS — R300 Dysuria: Secondary | ICD-10-CM

## 2020-07-31 LAB — CBC
HCT: 43.5 % (ref 39.0–52.0)
Hemoglobin: 15.1 g/dL (ref 13.0–17.0)
MCH: 30.3 pg (ref 26.0–34.0)
MCHC: 34.7 g/dL (ref 30.0–36.0)
MCV: 87.2 fL (ref 80.0–100.0)
Platelets: 487 10*3/uL — ABNORMAL HIGH (ref 150–400)
RBC: 4.99 MIL/uL (ref 4.22–5.81)
RDW: 14.9 % (ref 11.5–15.5)
WBC: 16 10*3/uL — ABNORMAL HIGH (ref 4.0–10.5)
nRBC: 0 % (ref 0.0–0.2)

## 2020-07-31 LAB — COMPREHENSIVE METABOLIC PANEL
ALT: 131 U/L — ABNORMAL HIGH (ref 0–44)
AST: 107 U/L — ABNORMAL HIGH (ref 15–41)
Albumin: 2.7 g/dL — ABNORMAL LOW (ref 3.5–5.0)
Alkaline Phosphatase: 72 U/L (ref 38–126)
Anion gap: 13 (ref 5–15)
BUN: 7 mg/dL (ref 6–20)
CO2: 24 mmol/L (ref 22–32)
Calcium: 8.9 mg/dL (ref 8.9–10.3)
Chloride: 96 mmol/L — ABNORMAL LOW (ref 98–111)
Creatinine, Ser: 0.62 mg/dL (ref 0.61–1.24)
GFR, Estimated: >60 mL/min (ref >60)
Glucose, Bld: 119 mg/dL — ABNORMAL HIGH (ref 70–99)
Potassium: 3.8 mmol/L (ref 3.5–5.1)
Sodium: 133 mmol/L — ABNORMAL LOW (ref 135–145)
Total Bilirubin: 0.1 mg/dL — ABNORMAL LOW (ref 0.3–1.2)
Total Protein: 6.5 g/dL (ref 6.5–8.1)

## 2020-07-31 LAB — CULTURE, BLOOD (ROUTINE X 2): Special Requests: ADEQUATE

## 2020-07-31 LAB — RPR: RPR Ser Ql: NONREACTIVE

## 2020-07-31 IMAGING — CT CT L SPINE W/ CM
3 series · 12 of 33 positions shown, 14 images · IV contrast (Omni 300)
Comparison: MRI [DATE]

CLINICAL DATA: Paraspinal mass or tumor.  Staph aureus bacteremia.

EXAM:
CT LUMBAR SPINE WITH CONTRAST
TECHNIQUE: Multidetector CT imaging of the lumbar spine was performed with
intravenous contrast administration.
CONTRAST:  100mL OMNIPAQUE IOHEXOL 300 MG/ML  SOLN

[Series 3: l-spine 2.0 st · axial · 0.35mm/px · z∈[+806,+1004]mm · 4 of 144 slices shown, 5 images]
[im 23/144  soft-tissue]
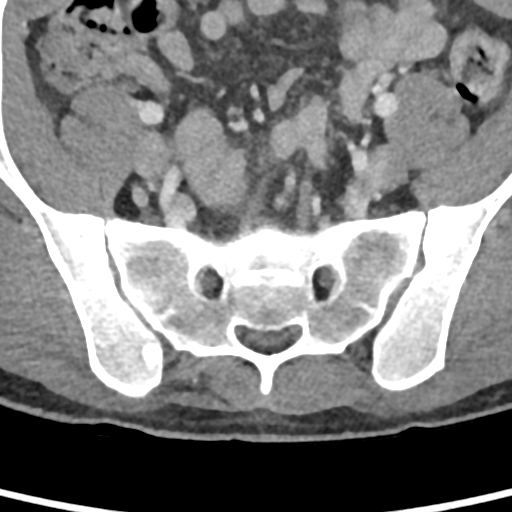
[im 23/144  bone]
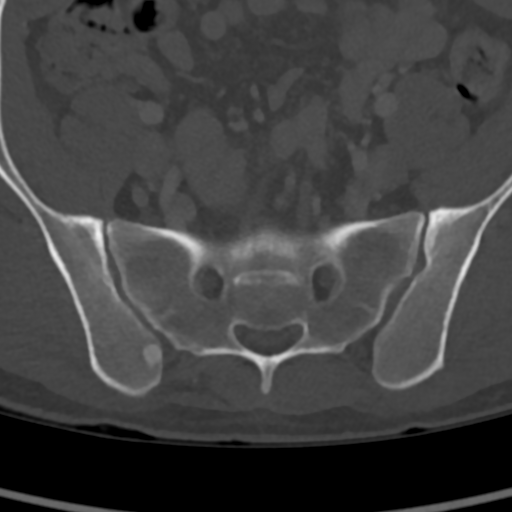
[im 56/144  bone]
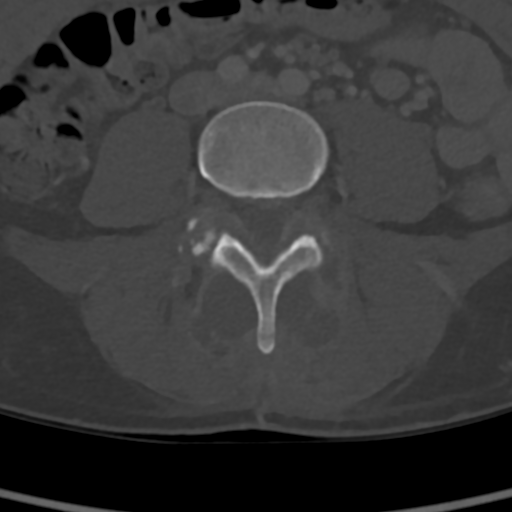
[im 89/144  bone]
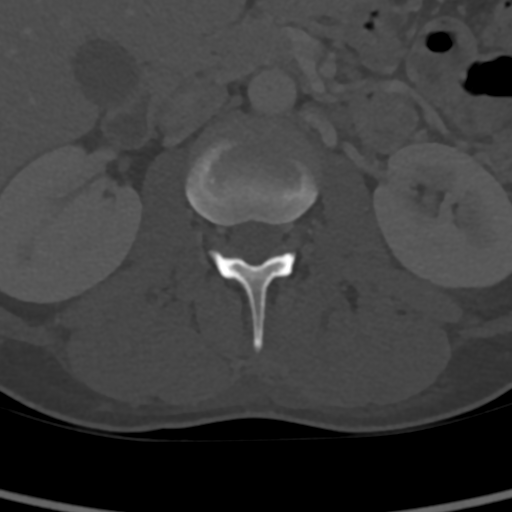
[im 122/144  bone]
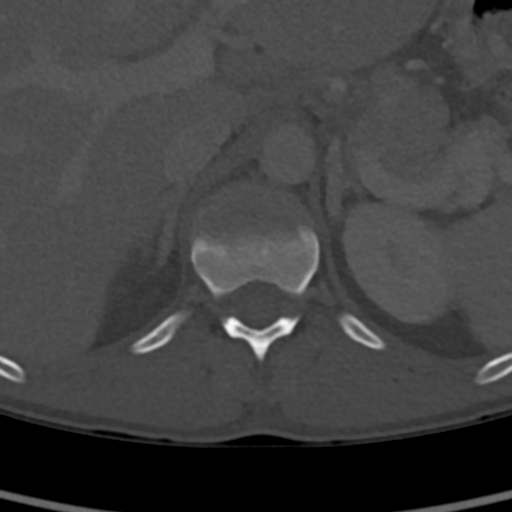

[Series 9: l-spine 2.0 cor · coronal · 0.50mm/px · 3 of 84 slices shown]
[im 17/84  bone]
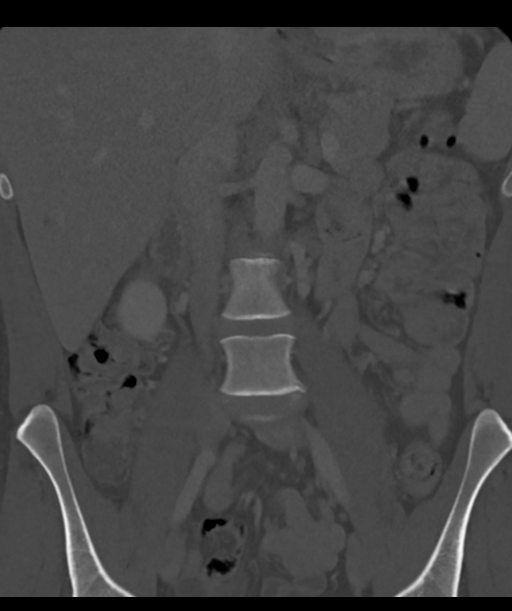
[im 34/84  bone]
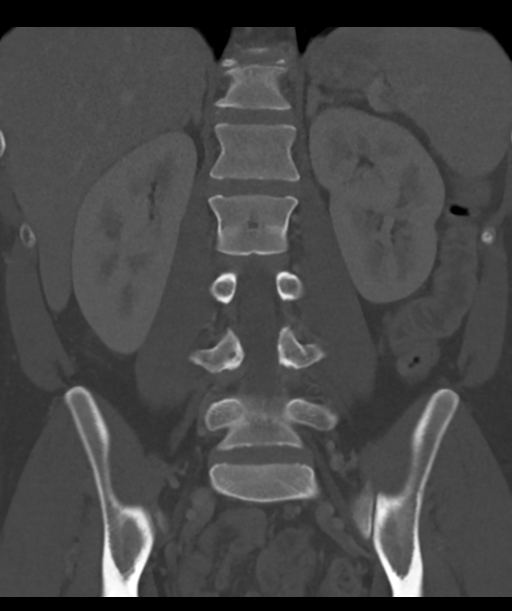
[im 50/84  bone]
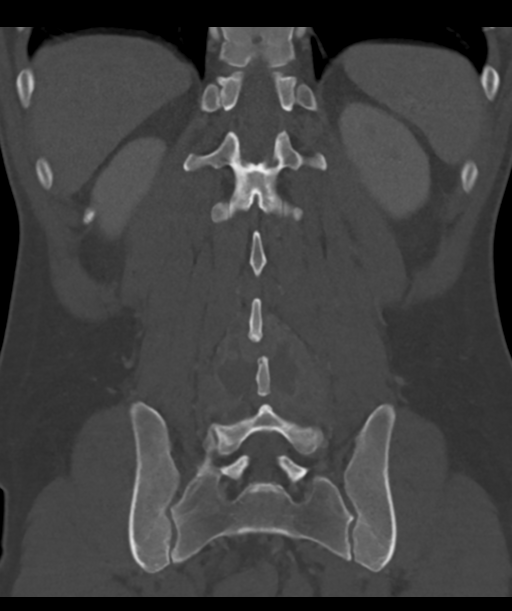

[Series 10: l-spine 2.0 sag · sagittal · 0.37mm/px · 5 of 123 slices shown, 6 images]
[im 41/123  bone]
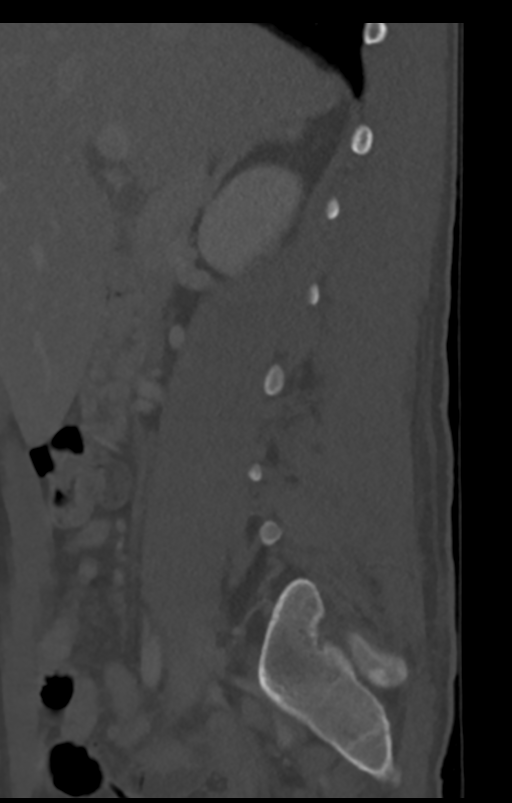
[im 51/123  bone]
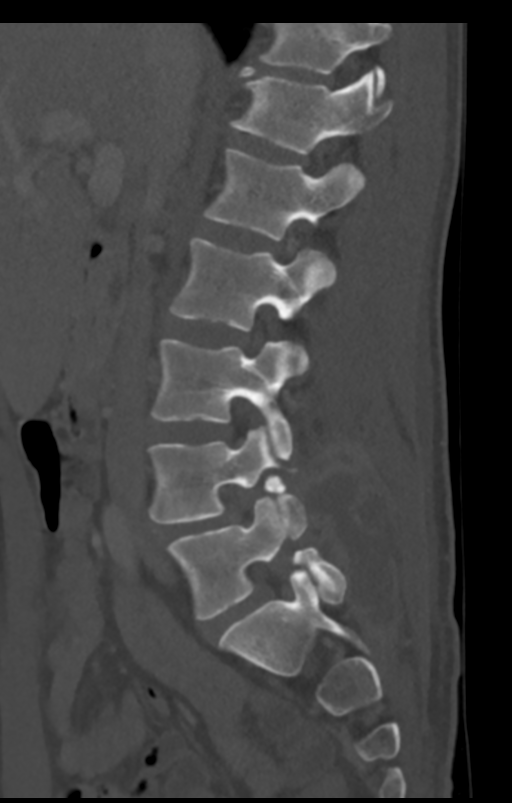
[im 62/123  soft-tissue]
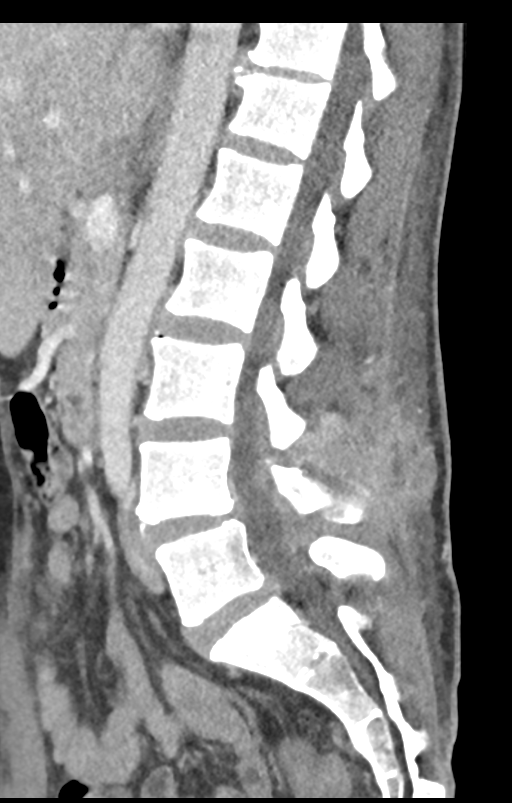
[im 62/123  bone]
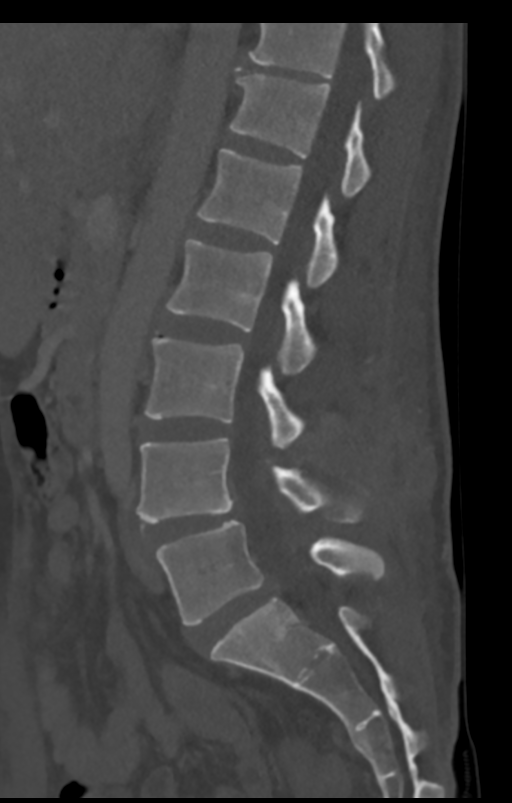
[im 72/123  bone]
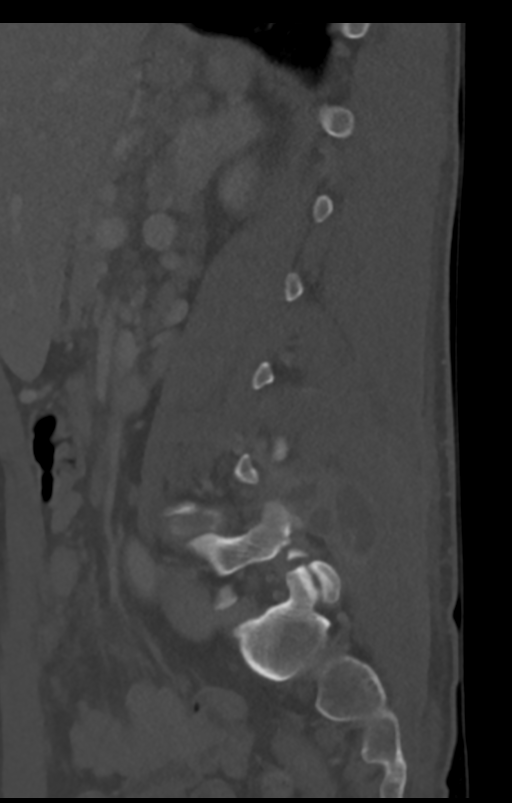
[im 82/123  bone]
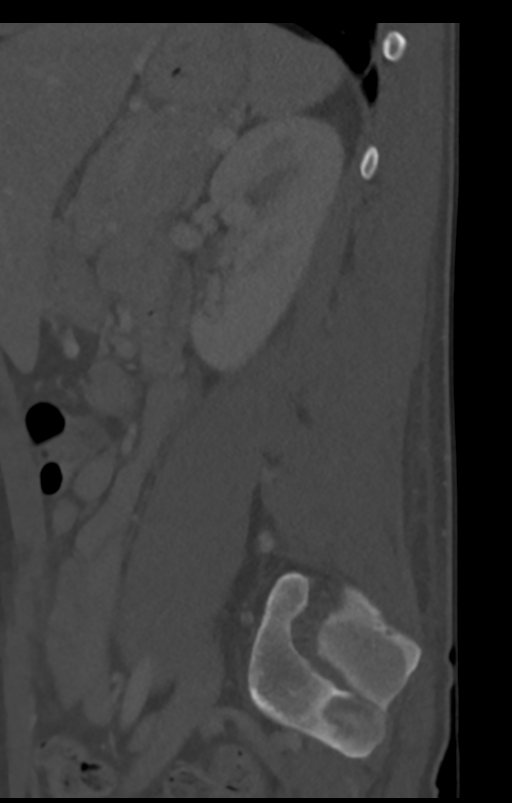

[12 of 33 positions shown; findings below may reference images not displayed]

FINDINGS: Segmentation: 5 lumbar type vertebral bodies.

Alignment: 3 mm spondylolisthesis L4-5. One or 2 mm
spondylolisthesis L5-S1.

Vertebrae: Chronic bilateral pars defects at L4-5 at L5-S1. No
evidence of lytic destructive change.

Paraspinal and other soft tissues: Edema and enhancement of the
posterior paraspinous musculature extending as high as inferior L3
and as low as lower S1, with the epicenter at the L4-5 level.
Nonenhancing fluid collections which are irregular in shape and
consistent with abscesses are present within the soft tissues as
high as the superior endplate of L4 and extending as low as inferior
S1 the right side. These are consistent with soft tissue abscesses.
Maximal size measured at the L4-5 level is about 2.5 cm bilaterally.
There appears to be some dorsal epidural inflammatory change. One
could question some extension of dorsal epidural nonenhancing tissue
at the level of the L4-5 disc and L5 vertebral body. This does not
appear to grossly compress the thecal sac.

Disc levels: No significant disc level pathology at L3-4 or above.

L4-5: Chronic bilateral pars defects with 3 mm of anterolisthesis.
Bulging of the disc. Mild stenosis of the lateral recesses and
foramina. Dorsal epidural inflammation or early abscess formation as
noted.

L5-S1: Chronic bilateral pars defects with 1 mm of anterolisthesis.
Bulging of the disc slightly more prominent towards the right. Some
dorsal epidural inflammation evident, particularly on the left, with
possibility of early epidural abscess.
IMPRESSION: 1. Edema and enhancement of the posterior paraspinous musculature
extending as high as inferior L3 and low as low as lower S1, with
the epicenter at the L4-5 level. Nonenhancing fluid collections
within the posterior soft tissues as high as the superior endplate
of L4 and extending as low as inferior S1 on the right side. These
are consistent with soft tissue abscesses. Maximal size measured at
the L4-5 level is about 2.5 cm bilaterally. There appears to be some
dorsal epidural inflammation or early abscess formation at the L4
and L5 levels. This does not appear to grossly compress the thecal
sac at this time.
2. Chronic bilateral pars defects at L4-5 at L5-S1 with 3 mm of
anterolisthesis at L4-5 in 1 or 2 mm of anterolisthesis at L5-S1.
Bulging the discs at those levels.

## 2020-07-31 MED ORDER — ACETAMINOPHEN 325 MG PO TABS
650.0000 mg | ORAL_TABLET | Freq: Four times a day (QID) | ORAL | Status: DC | PRN
  Administered 2020-07-31 – 2020-08-02 (×3): 650 mg via ORAL
  Filled 2020-07-31 (×3): qty 2

## 2020-07-31 MED ORDER — HYDROMORPHONE 1 MG/ML IV SOLN
INTRAVENOUS | Status: DC
  Administered 2020-07-31: 3 mg via INTRAVENOUS
  Administered 2020-07-31: 30 mg via INTRAVENOUS
  Administered 2020-07-31 – 2020-08-01 (×2): 4.2 mg via INTRAVENOUS
  Administered 2020-08-01: 4.8 mg via INTRAVENOUS
  Administered 2020-08-01: 15 mg via INTRAVENOUS
  Administered 2020-08-01: 1.8 mg via INTRAVENOUS
  Administered 2020-08-01: 3.6 mg via INTRAVENOUS
  Administered 2020-08-01: 5.4 mg via INTRAVENOUS
  Administered 2020-08-01: 30 mg via INTRAVENOUS
  Administered 2020-08-02: 6 mg via INTRAVENOUS
  Administered 2020-08-02: 4.2 mg via INTRAVENOUS
  Administered 2020-08-02: 30 mg via INTRAVENOUS
  Administered 2020-08-02: 4.8 mg via INTRAVENOUS
  Administered 2020-08-03: 1.2 mg via INTRAVENOUS
  Filled 2020-07-31 (×4): qty 30

## 2020-07-31 MED ORDER — IOHEXOL 300 MG/ML  SOLN
100.0000 mL | Freq: Once | INTRAMUSCULAR | Status: AC | PRN
  Administered 2020-07-31: 100 mL via INTRAVENOUS

## 2020-07-31 NOTE — Progress Notes (Signed)
Witnessed waste of Dilaudid (for PCA pump) with Shanda Bumps in med room.

## 2020-07-31 NOTE — Progress Notes (Addendum)
PCA syringe replaced. Dilaudid 65ml wasted and witnessed by [place].  I was told by the student nurse that saw the pt disconnecting his IV.  I talked to the pt that he is not allowed to disconnect his IV. I talked to the pt the importance aseptic technique while connecting and disconnecting the IV that's is why he is not suppose to disconnect it by himself.  All IV tubings and fluids was replaced.

## 2020-07-31 NOTE — Progress Notes (Signed)
RN noted that patient PCA dilaudid  tubing was dripping. IV tube was changed and after about 30 min, it started dripping again. Charge nurse witness and IV tube was replaced noting that the PCA IV tube had holes.

## 2020-07-31 NOTE — Progress Notes (Signed)
Subjective:   Hospital day: 1  Overnight event: No acute event   Patient is seen at bedside.  He appears comfortable and in no acute distress.  He states that his pain in lower back initially improved after the aspiration surgery but started noticing a "knot" on the left lower back that caused him significant pain.  Reports pain 7-8 with movement.  Patient states that the Dilaudid PCA does not provide enough dosage to help with his pain.  Patient states that he used fentanyl and heroin at a very high dose in the past so he has very high tolerance for pain medication.  Patient reports that the pain of his hips and legs are significantly better.  Patient reports 2 episodes of purulent skin lesion in the last few months, 1 on his scalp and 1 on right wrist.  Explained to patient that his MSSA bacteremia source is likely due to his drug injection.  Patient expressed that he would like be started on methadone during this admission.   Patient reports that his phone was lost after getting up to 5 N.  Security was notified.  Objective:  Vital signs in last 24 hours: Vitals:   07/30/20 2015 07/31/20 0021 07/31/20 0057 07/31/20 0443  BP: 119/78  120/75 126/85  Pulse: (!) 105  (!) 110 (!) 104  Resp:  (!) 21 19 19   Temp: 98.8 F (37.1 C)  98.9 F (37.2 C) 99.3 F (37.4 C)  TempSrc:   Oral Oral  SpO2: 98%  98% 99%  Weight:      Height:       CBC Latest Ref Rng & Units 07/31/2020 07/30/2020 07/30/2020  WBC 4.0 - 10.5 K/uL 16.0(H) 16.3(H) 15.3(H)  Hemoglobin 13.0 - 17.0 g/dL 09/27/2020 60.4 54.0  Hematocrit 39.0 - 52.0 % 43.5 43.1 40.7  Platelets 150 - 400 K/uL 487(H) 411(H) 407(H)   CMP Latest Ref Rng & Units 07/31/2020 07/30/2020 07/30/2020  Glucose 70 - 99 mg/dL 09/27/2020) 191(Y) 782(N)  BUN 6 - 20 mg/dL 7 10 17   Creatinine 0.61 - 1.24 mg/dL 562(Z 3.08  Sodium 135 - 145 mmol/L 133(L) 133(L) 133(L)  Potassium 3.5 - 5.1 mmol/L 3.8 3.9 4.1  Chloride 98 - 111 mmol/L 96(L) 95(L) 94(L)  CO2 22 - 32  mmol/L 24 26 27   Calcium 8.9 - 10.3 mg/dL 8.9 6.57) 8.46)  Total Protein 6.5 - 8.1 g/dL 6.5 6.3(L) 6.3(L)  Total Bilirubin 0.3 - 1.2 mg/dL ) 0.3 0.4  Alkaline Phos 38 - 126 U/L 72 69 70  AST 15 - 41 U/L 107(H) 130(H) 111(H)  ALT 0 - 44 U/L 131(H) 140(H) 130(H)     Physical Exam  Physical Exam Constitutional:      General: He is not in acute distress. HENT:     Head: Normocephalic.  Eyes:     General:        Right eye: No discharge.        Left eye: No discharge.  Cardiovascular:     Rate and Rhythm: Normal rate and regular rhythm.  Pulmonary:     Effort: Pulmonary effort is normal. No respiratory distress.  Abdominal:     General: Bowel sounds are normal.     Tenderness: There is no abdominal tenderness.  Musculoskeletal:     Comments: A fluctuant mass palpated on the left paraspinal muscle, tender to palpation.  No pain to palpation of upper back  Skin:    General: Skin is warm.  Neurological:  Mental Status: He is alert.     Assessment/Plan: George Summers is a 40 year old with IV drug use history who was admitted for back pain, found to have a lumbar paraspinal myositis with abscess and MSSA bacteremia, currently treated with IV cefazolin.   Status post abscess aspiration with IR.  ID consulted.  Active Problems:   MSSA bacteremia  MSSA Bacteremia  Lumbar Paraspinal Myositis with Abscess  Osteomyelitis L4  -Patient underwent IR abscess aspiration yesterday with improvement of his back pain.  However a fluctuant mass palpated in the left paraspinal today.  Will obtain a CT lumbar to evaluate for any fluid collection.  IR was made aware -MRI of right hip negative for septic arthritis -ID consulted for MSSA bacteremia. Currently on IV cefazolin. Last echo done on 2/8 showed trivial tricuspid regurgitation. Given his IV drug use, high suspicion of infective endocarditis. TEE scheduled on Monday to evaluate for vegetations - Appreciate ID recommendations -  Follow blood culture - TEE scheduled on Monday - Cefazolin 2g IV q8 hours.  Anticipated 6 weeks of treatment - Xarelto 10mg  daily  - Increase the dose of Dilaudid PCA pump given his history of IV drug use and tolerance - Ondansetron 4mg  q6hrs PRN - Pending CT lumbar results.  Will contact IR if abnormal   Polysubstance Abuse  Continue Dilaudid PCA pump at this time given ongoing infection and abscess. Will transition to as needed Dilaudid when symptoms improve. Plan to start methadone during this admission. - Dilaudid PCA pump w/ Naloxone PRN - Narcan available as needed - Ondansetron 4mg  q6hrs PRN - Diphenhydramine 12.5mg  IV q6hrs PRN  - Nicotine 14mg  patch daily   Atypical chest pain  Chronic Cough Possible Hemoptysis  Patient states he has had a productive cough that has remained constant over the past 4 months with pink sputum. Also complains of atypical chest pain. Chest x-ray was unremarkable. Differentials include septic pulmonary emboli versus undiagnosed COPD in setting of prolonged smoking history.  - Pending TEE - Consider CT chest if symptoms worsen    Elevated Liver Enzymes  LFT trending down slightly today. HIV and hepatitis (hep B and hep C) labs negative in 2/8.  Patient does have history of alcohol use.  His elevated liver enzyme is likely due to bacteremia. If not improved after treating infection, will consider checking liver.  - Monitor CMP    Dysuria Patient states that he had dysuria and urinary urgency that preceded his current symptoms. Denies troubles with urinary incontinence or initiating urination. Denies flank pain concerning for pyelonephritis. UA normal. ID recommended checking gonorrhea and chlamydia.  Also screen for RPR - Pending gonorrhea and Chlamydia - Pending RPR   Code status: Full Code  Diet: Regular  DVT PPx: On Xarelto 10mg  daily IVF: LR @ 174mL/hr   Prior to Admission Living Arrangement: Home Anticipated Discharge  Location: to be determined Barriers to Discharge: medical treatment   , DO 07/31/2020, 6:16 AM Pager: [phone] After 5pm on weekdays and 1pm on weekends: On Call pager [phone]

## 2020-07-31 NOTE — Progress Notes (Signed)
Entered patient's room to prepare him for CT and get him in a gown. Patient made several inappropriate comments towards me, which I ignored and told him that would not be accepted. I informed patient I would need to disconnect his pump shortly to get him out of his shirt and into the gown. Before I could stop him, patient disconnected his IV using his mouth/teeth and left it sitting on his dirty linens. I told the patient that this was not acceptable practice as it was unhygienic and could lead to an infection. I attempted to reconnect the patient once he was in his gown, but he refused. Primary RN made aware. Patient stated that he had been disconnecting himself every time he wanted to go to the bathroom or walk around as it was easier than the hassle of walking with it. RN made aware.

## 2020-07-31 NOTE — Progress Notes (Addendum)
   07/31/20 1623  Assess: MEWS Score  Temp (!) 103.1 F (39.5 C)  BP 111/76  Pulse Rate (!) 110  Resp 15  Level of Consciousness Alert  SpO2 96 %  O2 Device Room Air  Assess: MEWS Score  MEWS Temp 2  MEWS Systolic 0  MEWS Pulse 1  MEWS RR 0  MEWS LOC 0  MEWS Score 3  MEWS Score Color Yellow  Assess: if the MEWS score is Yellow or Red  Were vital signs taken at a resting state? Yes  Focused Assessment Change from prior assessment (see assessment flowsheet)  Early Detection of Sepsis Score *See Row Information* Low  Treat  MEWS Interventions Escalated (See documentation below);Administered prn meds/treatments  Pain Scale 0-10  Pain Score 8  Pain Type Acute pain  Pain Location Head  Pain Descriptors / Indicators Aching  Pain Intervention(s) Medication (See eMAR)  Take Vital Signs  Increase Vital Sign Frequency  Yellow: Q 2hr X 2 then Q 4hr X 2, if remains yellow, continue Q 4hrs  Escalate  MEWS: Escalate Yellow: discuss with charge nurse/RN and consider discussing with provider and RRT  Notify: Charge Nurse/RN  Name of Charge Nurse/RN Notified Roj  Date Charge Nurse/RN Notified 07/31/20  Time Charge Nurse/RN Notified 1640  Notify: Provider  Provider Name/Title Dr Marijo Conception  Date Provider Notified 07/31/20  Time Provider Notified 1639  Notification Type Call  Notification Reason Change in status  Response See new orders  Date of Provider Response 07/31/20  Time of Provider Response 1640  Notify: Rapid Response  Name of Rapid Response RN Notified N/A   Pt is A&O x4. Just c/o headache. Encouraged oral fluids. Room temp is too warm, temp adjusted and kept the door open.  1843 dr Nino Parsley updated with current V/S. Pt is in bed, ate dinner.

## 2020-07-31 NOTE — Progress Notes (Signed)
Entered patient's room to prepare him for CT and get him in a gown. Patient made several inappropriate comments towards me, which I ignored and told him that would not be accepted. I informed patient I would need to disconnect his pump shortly to get him out of his shirt and into the gown. Before I could stop him, patient disconnected his IV using his mouth/teeth and left it sitting on his dirty linens. I told the patient that this was not acceptable practice as it was unhygienic and could lead to an infection. I attempted to reconnect the patient once he was in his gown, but he refused. Primary RN made aware. Patient stated that he had been disconnecting himself every time he wanted to go to the bathroom or walk around as it was easier than the hassle of walking with it. RN made aware.   Concur with documentation.  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

## 2020-08-01 DIAGNOSIS — R945 Abnormal results of liver function studies: Secondary | ICD-10-CM

## 2020-08-01 DIAGNOSIS — F1123 Opioid dependence with withdrawal: Secondary | ICD-10-CM

## 2020-08-01 LAB — COMPREHENSIVE METABOLIC PANEL
ALT: 176 U/L — ABNORMAL HIGH (ref 0–44)
AST: 230 U/L — ABNORMAL HIGH (ref 15–41)
Albumin: 2.6 g/dL — ABNORMAL LOW (ref 3.5–5.0)
Alkaline Phosphatase: 94 U/L (ref 38–126)
Anion gap: 10 (ref 5–15)
BUN: 12 mg/dL (ref 6–20)
CO2: 28 mmol/L (ref 22–32)
Calcium: 8.6 mg/dL — ABNORMAL LOW (ref 8.9–10.3)
Chloride: 93 mmol/L — ABNORMAL LOW (ref 98–111)
Creatinine, Ser: 0.66 mg/dL (ref 0.61–1.24)
GFR, Estimated: >60 mL/min (ref >60)
Glucose, Bld: 111 mg/dL — ABNORMAL HIGH (ref 70–99)
Potassium: 4 mmol/L (ref 3.5–5.1)
Sodium: 131 mmol/L — ABNORMAL LOW (ref 135–145)
Total Bilirubin: 0.4 mg/dL (ref 0.3–1.2)
Total Protein: 6.5 g/dL (ref 6.5–8.1)

## 2020-08-01 LAB — CBC
HCT: 44 % (ref 39.0–52.0)
Hemoglobin: 14.9 g/dL (ref 13.0–17.0)
MCH: 30 pg (ref 26.0–34.0)
MCHC: 33.9 g/dL (ref 30.0–36.0)
MCV: 88.5 fL (ref 80.0–100.0)
Platelets: 411 10*3/uL — ABNORMAL HIGH (ref 150–400)
RBC: 4.97 MIL/uL (ref 4.22–5.81)
RDW: 14.9 % (ref 11.5–15.5)
WBC: 10.9 10*3/uL — ABNORMAL HIGH (ref 4.0–10.5)
nRBC: 0 % (ref 0.0–0.2)

## 2020-08-01 MED ORDER — HYDROXYZINE HCL 25 MG PO TABS
25.0000 mg | ORAL_TABLET | Freq: Three times a day (TID) | ORAL | Status: DC | PRN
  Administered 2020-08-02: 25 mg via ORAL
  Filled 2020-08-01: qty 1

## 2020-08-01 NOTE — Anesthesia Preprocedure Evaluation (Addendum)
Anesthesia Evaluation  Patient identified by MRN, date of birth, ID band Patient awake    Reviewed: Allergy & Precautions, NPO status , Patient's Chart, lab work & pertinent test results  Airway Mallampati: II  TM Distance: >3 FB Neck ROM: Full    Dental  (+) Poor Dentition, Chipped, Missing,    Pulmonary neg pulmonary ROS, Current Smoker and Patient abstained from smoking.,    Pulmonary exam normal breath sounds clear to auscultation       Cardiovascular negative cardio ROS Normal cardiovascular exam Rhythm:Regular Rate:Normal     Neuro/Psych negative neurological ROS  negative psych ROS   GI/Hepatic negative GI ROS, (+)     substance abuse  marijuana use and IV drug use,   Endo/Other  negative endocrine ROS  Renal/GU negative Renal ROS  negative genitourinary   Musculoskeletal negative musculoskeletal ROS (+) narcotic dependent  Abdominal   Peds  Hematology negative hematology ROS (+)   Anesthesia Other Findings TEE to r/o endocarditis in setting of bacteremia and a L4 paraspinal abscess 2/2 IVDU  Reproductive/Obstetrics                            Anesthesia Physical Anesthesia Plan  ASA: III  Anesthesia Plan: MAC   Post-op Pain Management:    Induction: Intravenous  PONV Risk Score and Plan: Propofol infusion and Treatment may vary due to age or medical condition  Airway Management Planned: Natural Airway  Additional Equipment:   Intra-op Plan:   Post-operative Plan:   Informed Consent: I have reviewed the patients History and Physical, chart, labs and discussed the procedure including the risks, benefits and alternatives for the proposed anesthesia with the patient or authorized representative who has indicated his/her understanding and acceptance.     Dental advisory given  Plan Discussed with: CRNA  Anesthesia Plan Comments:         Anesthesia Quick  Evaluation

## 2020-08-01 NOTE — Progress Notes (Signed)
    [facility] has been requested to perform a transesophageal echocardiogram on 08/02/20 for bacteremia.  After careful review of history and examination, the risks and benefits of transesophageal echocardiogram have been explained including risks of esophageal damage, perforation (1:10,000 risk), bleeding, pharyngeal hematoma as well as other potential complications associated with conscious sedation including aspiration, arrhythmia, respiratory failure and death. Alternatives to treatment were discussed, questions were answered. Patient is willing to proceed.   TEE scheduled for 08/02/20 at 7:30am with Dr. Mayford Knife.  Judy Pimple, PA-C 08/01/2020 9:42 AM

## 2020-08-01 NOTE — Progress Notes (Signed)
Subjective:   Hospital day: 2  Overnight event: Patient with Tmax 103.1 for which given Tylenol with improvement.   Patient is seen at bedside.  He appears comfortable and in no acute distress.  He notes that pain is currently well controlled with increase in PCA dosage. He endorses good appetite and is mostly eating fruit cups. Discussed CT Lumbar spine findings with patient and that he would likely have procedure tomorrow for drainage. Also advised that he will have TEE in morning and to remain NPO at midnight. He expresses understanding.   RN expressed concern regarding patient smoking in the bathroom. Advised patient that this is against hospital policy.    Objective:  Vital signs in last 24 hours: Vitals:   08/01/20 0001 08/01/20 0010 08/01/20 0353 08/01/20 0420  BP: 133/77   102/66  Pulse:  100  86  Resp: 19 18 17 18   Temp: 99.8 F (37.7 C)   (!) 101 F (38.3 C)  TempSrc: Oral   Oral  SpO2: 97% 97%  100%  Weight:      Height:       CBC Latest Ref Rng & Units 08/01/2020 07/31/2020 07/30/2020  WBC 4.0 - 10.5 K/uL 10.9(H) 16.0(H) 16.3(H)  Hemoglobin 13.0 - 17.0 g/dL 09/27/2020 74.1 63.8  Hematocrit 39.0 - 52.0 % 44.0 43.5 43.1  Platelets 150 - 400 K/uL 411(H) 487(H) 411(H)   CMP Latest Ref Rng & Units 08/01/2020 07/31/2020 07/30/2020  Glucose 70 - 99 mg/dL 09/27/2020) 646(O) 032(Z)  BUN 6 - 20 mg/dL 12 7 10   Creatinine 0.61 - 1.24 mg/dL 224(M 2.50  Sodium 135 - 145 mmol/L 131(L) 133(L) 133(L)  Potassium 3.5 - 5.1 mmol/L 4.0 3.8 3.9  Chloride 98 - 111 mmol/L 93(L) 96(L) 95(L)  CO2 22 - 32 mmol/L 28 24 26   Calcium 8.9 - 10.3 mg/dL 0.37) 8.9 0.48)  Total Protein 6.5 - 8.1 g/dL 6.5 6.5 6.3(L)  Total Bilirubin 0.3 - 1.2 mg/dL 0.4 ) 0.3  Alkaline Phos 38 - 126 U/L 94 72 69  AST 15 - 41 U/L 230(H) 107(H) 130(H)  ALT 0 - 44 U/L 176(H) 131(H) 140(H)    Physical Exam  Constitutional: Appears well-developed and well-nourished. No acute distress.  HENT: Normocephalic and  atraumatic, EOMI, conjunctiva normal, moist mucous membranes Cardiovascular: Normal rate, regular rhythm, S1 and S2 present, no murmurs, rubs, gallops.  Distal pulses intact Respiratory: No respiratory distress, no accessory muscle use.  Effort is normal.  Lungs are clear to auscultation bilaterally. GI: Nondistended, soft, nontender to palpation, active bowel sounds Musculoskeletal: Normal bulk and tone.  No peripheral edema noted. Persistent fluctuant mass on left paraspinal muscle slightly tender to palpation.  Neurological: Is alert and oriented x4, no apparent focal deficits noted.  CT LUMBAR SPINE W CONTRAST: IMPRESSION: 1. Edema and enhancement of the posterior paraspinous musculature extending as high as inferior L3 and low as low as lower S1, with the epicenter at the L4-5 level. Nonenhancing fluid collections within the posterior soft tissues as high as the superior endplate of L4 and extending as low as inferior S1 on the right side. These are consistent with soft tissue abscesses. Maximal size measured at the L4-5 level is about 2.5 cm bilaterally. There appears to be some dorsal epidural inflammation or early abscess formation at the L4 and L5 levels. This does not appear to grossly compress the thecal sac at this time. 2. 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 Bulging the discs at those levels.  Assessment/Plan: George Summers is a 40 year old with IV drug use history who was admitted for back pain, found to have a lumbar paraspinal myositis with abscess and MSSA bacteremia, currently treated with IV cefazolin.   Status post abscess aspiration with IR.  ID consulted.  Principal Problem:   MSSA bacteremia Active Problems:   Paraspinal abscess (HCC)   Heroin withdrawal (HCC)  MSSA Bacteremia  Lumbar Paraspinal Myositis with Abscess  Osteomyelitis L4  Patient admitted with lumbar  paraspinal myositis w/abscess s/p drainage on 2/11 in setting of MSSA bacteremia. CT lumbar spine obtained for persistent back pain and noted to have similar abscess ~2.5cm in size bilaterally. IR made aware and plan for aspiration in AM. Also noted to have early abscess formation at L4-L5 level without compression of thecal sac; neurosurgery consulted for this. Recommend for continued monitoring as no focal deficits on exam at this time.  Patient did have Tmax of 103.1 overnight. He is currently on IV cefazolin. Most recent Echo with trivial tricuspid regurgitation. TEE scheduled in AM to evaluate for vegetations.  - Appreciate ID recommendations - Repeat blood cultures  - TEE scheduled in AM - Cefazolin 2g IV q8 hours.  Anticipated 6 weeks of treatment - Xarelto 10mg  daily  - Continue Dilaudid PCA pump for pain control  - Ondansetron 4mg  q6hrs PRN - IR guided aspiration of paraspinal abscesses in AM    Polysubstance Abuse  Continue Dilaudid PCA pump at this time given ongoing infection and abscess. Will transition to as needed Dilaudid when symptoms improve. Plan to start methadone during this admission. Patient requesting something to help with his anxiety. He notes that clonazepam has worked for him in the past. Advised to start with Atarax at this time for which patient is agreeable.  - Dilaudid PCA pump w/ Naloxone PRN - Narcan available as needed - Ondansetron 4mg  q6hrs PRN - Diphenhydramine 12.5mg  IV q6hrs PRN  - Nicotine 14mg  patch daily - Start hydroxyzine 25mg  tid prn    Elevated Liver Enzymes  HIV and hepatitis (hep B and hep C) labs negative in 2/8.  Patient does have history of alcohol use. Suspect current transaminitis is in setting of ongoing infection.  - Monitor CMP  - Consider RUQ if persistent    Code status: Full Code  Diet: Regular  DVT PPx: On Xarelto 10mg  daily IVF: None  Prior to Admission Living Arrangement: Home Anticipated Discharge Location:  to be determined Barriers to Discharge: medical treatment   , MD  08/01/2020, 7:17 AM Pager: [phone] After 5pm on weekdays and 1pm on weekends: On Call pager [phone]

## 2020-08-01 NOTE — Consult Note (Signed)
Chief Complaint: Back pain. Request is for lumbar paraspinal abscess aspiration   Supervising Physician: Ruel Favors  Patient Status: [facility] - In-pt  History of Present Illness: George Summers is a 40 y.o. male ory of IV drug abuse. Presented to the ED at [facility] with lower back pain, weakness and difficulty walking. Found to have leukocytosis and elevated inflammatory markers with a L4 paraspinal abscess. On 2.11.22 IR aspirated the abscess. CT lumbar from 2.12.22 shows abscess to the posterior soft tissues from L4 to S1. Team is requesting aspiration.   George Summers  alert and oriented walking around his room. He is endorsing back pain at this time. He states the pain as improved since the paraspinal aspiration on 2.11.22 and he is now able to sit. Pain is worse with movement. He is voicing concern regarding his procedural pain management but states he was comfortable with his sedation on 2.11.22. Return precautions and treatment recommendations and follow-up discussed with the patient who is agreeable with the plan.   Past Medical History:  Diagnosis Date  . GI (gastrointestinal bleed)    PT reports a stomach ulcer  . 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 IR US GUIDE BX ASP/DRAIN  07/30/2020  . TONSILLECTOMY      Allergies: Patient has no known allergies.  Medications: Prior to Admission medications   Medication Sig Start Date End Date Taking? Authorizing Provider  ibuprofen (ADVIL) 200 MG tablet Take 1,200 mg by mouth every 6 (six) hours as needed for headache or moderate pain.   Yes [provider]  naloxone [facility]) 2 MG/2ML injection Place 1 mL into the nose as needed. Opioid overdose. 09/22/19  Yes [provider]  methocarbamol (ROBAXIN) 500 MG tablet Take 1 tablet (500 mg total) by mouth 2 (two) times daily. 07/27/20   Gailen Shelter, PA     No family history on file.  Social History   Socioeconomic  History  . Marital status: Single    Spouse name: Not on file  . Number of children: Not on file  . Years of education: Not on file  . Highest education level: Not on file  Occupational History  . Not on file  Tobacco Use  . Smoking status: Current Every Day Smoker    Types: Cigarettes  . Smokeless tobacco: Never Used  Substance and Sexual Activity  . Alcohol use: Yes    Comment: Pt drank last night.  . Drug use: Yes    Types: Marijuana    Comment: Last smoke was last night 06-20-15  . Sexual activity: Not on file  Other Topics Concern  . Not on file  Social History Narrative  . Not on file   Social Determinants of Health   Financial Resource Strain: Not on file  Food Insecurity: Not on file  Transportation Needs: Not on file  Physical Activity: Not on file  Stress: Not on file  Social Connections: Not on file     Review of Systems: A 12 point ROS discussed and pertinent positives are indicated in the HPI above.  All other systems are negative.  Review of Systems  Constitutional: Negative for fever.  HENT: Negative for congestion.   Respiratory: Negative for cough and shortness of breath.   Cardiovascular: Negative for chest pain.  Gastrointestinal: Negative for abdominal pain.  Musculoskeletal: Positive for back pain.  Neurological: Negative for headaches.  Psychiatric/Behavioral: Negative for behavioral problems  and confusion.    Vital Signs: BP 118/69 (BP Location: Left Arm)   Pulse 98   Temp (!) 100.4 F (38 C) (Oral) Comment: RN notified  Resp 17   Ht 5\' 10"  (1.778 m)   Wt 158 lb 11.7 oz (72 kg)   SpO2 100%   BMI 22.78 kg/m   Physical Exam Vitals and nursing note reviewed.  Constitutional:      Appearance: He is well-developed and well-nourished.  HENT:     Head: Normocephalic.  Cardiovascular:     Rate and Rhythm: Normal rate and regular rhythm.     Heart sounds: Normal heart sounds.  Pulmonary:     Effort: Pulmonary effort is normal.      Breath sounds: Normal breath sounds.  Musculoskeletal:        General: Normal range of motion.     Cervical back: Normal range of motion.  Skin:    General: Skin is dry.  Neurological:     Mental Status: He is alert and oriented to person, place, and time.  Psychiatric:        Mood and Affect: Mood and affect normal.     Imaging: CT LUMBAR SPINE W CONTRAST  Result Date: 07/31/2020 CLINICAL DATA:  Paraspinal mass or tumor.  Staph aureus bacteremia. EXAM: CT LUMBAR SPINE WITH CONTRAST TECHNIQUE: Multidetector CT imaging of the lumbar spine was performed with intravenous contrast administration. CONTRAST:  09/28/2020 OMNIPAQUE IOHEXOL 300 MG/ML  SOLN COMPARISON:  MRI 07/27/2020 FINDINGS: Segmentation: 5 lumbar type vertebral bodies. Alignment: 3 mm spondylolisthesis L4-5. One or 2 mm spondylolisthesis L5-S1. 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 No evidence of lytic destructive change. Paraspinal and other soft tissues: Edema and enhancement of the posterior paraspinous musculature extending as high as inferior L3 and as low as lower S1, with the epicenter at the L4-5 level. Nonenhancing fluid collections which are irregular in shape and consistent with abscesses are present within the soft tissues as high as the superior endplate of L4 and extending as low as inferior S1 the right side. These are consistent with soft tissue abscesses. Maximal size measured at the L4-5 level is about 2.5 cm bilaterally. There appears to be some dorsal epidural inflammatory change. One could question some extension of dorsal epidural nonenhancing tissue at the level of the L4-5 disc and L5 vertebral body. This does not appear to grossly compress the thecal sac. Disc levels: No significant disc level pathology at L3-4 or above. L4-5: Chronic bilateral pars defects with 3 mm of anterolisthesis. Bulging of the disc. Mild stenosis of the lateral recesses and foramina. Dorsal epidural inflammation or early  abscess formation as noted. L5-S1: Chronic bilateral pars defects with 1 mm of anterolisthesis. Bulging of the disc slightly more prominent towards the right. Some dorsal epidural inflammation evident, particularly on the left, with possibility of early epidural abscess. IMPRESSION: 1. Edema and enhancement of the posterior paraspinous musculature extending as high as inferior L3 and low as low as lower S1, with the epicenter at the L4-5 level. Nonenhancing fluid collections within the posterior soft tissues as high as the superior endplate of L4 and extending as low as inferior S1 on the right side. These are consistent with soft tissue abscesses. Maximal size measured at the L4-5 level is about 2.5 cm bilaterally. There appears to be some dorsal epidural inflammation or early abscess formation at the L4 and L5 levels. This does not appear to grossly compress the thecal sac at this  time. 2. 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 Bulging the discs at those levels. Electronically Signed   By: Paulina Fusi M.D.   On: 07/31/2020 19:10   MR THORACIC SPINE W CONTRAST  Result Date: 07/27/2020 CLINICAL DATA:  Severe low back pain, IV drug user EXAM: MRI THORACIC AND LUMBAR SPINE WITHOUT AND WITH CONTRAST TECHNIQUE: Multiplanar and multiecho pulse sequences of the thoracic and lumbar spine were obtained without and with intravenous contrast. CONTRAST:  7mL GADAVIST GADOBUTROL 1 MMOL/ML IV SOLN COMPARISON:  None. FINDINGS: MRI THORACIC SPINE Motion artifact is present. Alignment: Preserved Vertebrae: Endplate irregularity of T6-T7 through T11-T12. Vertebral body heights are otherwise maintained. Minimal marrow edema and enhancement underlying the T9 superior endplate. Cord:  No abnormal signal.  No abnormal intrathecal enhancement. Paraspinal and other soft tissues: Unremarkable. Disc levels: There is no disc edema or abnormal  enhancement. No significant degenerative stenosis. MRI LUMBAR SPINE Motion artifact is present. Segmentation: Standard. Alignment:  Trace anterolisthesis at L4-L5. Vertebrae: Vertebral body heights are maintained. There is minimal endplate marrow edema at L4-L5 likely on a degenerative basis. There is marrow edema associated with the inferior right L4 facet. Conus medullaris: Extends to the L1 level and appears normal. No abnormal intrathecal enhancement. Paraspinal and other soft tissues: Paraspinal edema and enhancement posteriorly from L4 to sacral levels. This is greatest at the L4 level where there are peripherally enhancing abscesses. For example on the left measuring up to 2.9 cm craniocaudally (series 6, image 10). There is dorsal epidural enhancement at the L4 and L5 levels without abscess. Disc levels: No disc edema or enhancement. There is disc height loss at L4-L5 and L5-S1. Disc bulges are present at these levels with facet arthropathy. No high-grade stenosis. IMPRESSION: Motion degraded studies. 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 Adjacent inferior right L4 facet marrow edema likely reflects osteomyelitis. Dorsal epidural enhancement is present at the L4 and L5 levels without abscess formation. Minimal marrow edema and enhancement underlying the T9 superior endplate. Favored to be on a degenerative basis. Alternatively, could reflect early changes of infection. Electronically Signed   By: Guadlupe Spanish M.D.   On: 07/27/2020 08:30   MR Lumbar Spine W Wo Contrast  Result Date: 07/27/2020 CLINICAL DATA:  Severe low back pain, IV drug user EXAM: MRI THORACIC AND LUMBAR SPINE WITHOUT AND WITH CONTRAST TECHNIQUE: Multiplanar and multiecho pulse sequences of the thoracic and lumbar spine were obtained without and with intravenous contrast. CONTRAST:  7mL GADAVIST GADOBUTROL 1 MMOL/ML IV SOLN COMPARISON:  None. FINDINGS: MRI THORACIC SPINE Motion  artifact is present. Alignment: Preserved Vertebrae: Endplate irregularity of T6-T7 through T11-T12. Vertebral body heights are otherwise maintained. Minimal marrow edema and enhancement underlying the T9 superior endplate. Cord:  No abnormal signal.  No abnormal intrathecal enhancement. Paraspinal and other soft tissues: Unremarkable. Disc levels: There is no disc edema or abnormal enhancement. No significant degenerative stenosis. MRI LUMBAR SPINE Motion artifact is present. Segmentation: Standard. Alignment:  Trace anterolisthesis at L4-L5. Vertebrae: Vertebral body heights are maintained. There is minimal endplate marrow edema at L4-L5 likely on a degenerative basis. There is marrow edema associated with the inferior right L4 facet. Conus medullaris: Extends to the L1 level and appears normal. No abnormal intrathecal enhancement. Paraspinal and other soft tissues: Paraspinal edema and enhancement posteriorly from L4 to sacral levels. This is greatest at  the L4 level where there are peripherally enhancing abscesses. For example on the left measuring up to 2.9 cm craniocaudally (series 6, image 10). There is dorsal epidural enhancement at the L4 and L5 levels without abscess. Disc levels: No disc edema or enhancement. There is disc height loss at L4-L5 and L5-S1. Disc bulges are present at these levels with facet arthropathy. No high-grade stenosis. IMPRESSION: Motion degraded studies. 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 Adjacent inferior right L4 facet marrow edema likely reflects osteomyelitis. Dorsal epidural enhancement is present at the L4 and L5 levels without abscess formation. Minimal marrow edema and enhancement underlying the T9 superior endplate. Favored to be on a degenerative basis. Alternatively, could reflect early changes of infection. Electronically Signed   By: Guadlupe Spanish M.D.   On: 07/27/2020 08:30   MR HIP RIGHT WO CONTRAST  Result  Date: 07/30/2020 CLINICAL DATA:  Right hip pain.  MSSA bacteremia.  IV drug abuser. EXAM: MR OF THE RIGHT HIP WITHOUT CONTRAST TECHNIQUE: Multiplanar, multisequence MR imaging was performed. No intravenous contrast was administered. COMPARISON:  None. FINDINGS: Bones: There is no evidence of acute fracture, dislocation or avascular necrosis. No focal bone lesion. The visualized sacroiliac joints and symphysis pubis appear normal. Articular cartilage and labrum Articular cartilage: No focal chondral defect. Small subchondral cysts in the left acetabular roof. Labrum: Grossly intact, although evaluation is limited due to lack of intra-articular fluid. No paralabral abnormality. Joint or bursal effusion Joint effusion: No significant hip joint effusion. Bursae: No focal periarticular fluid collection. Muscles and tendons Muscles and tendons: The visualized gluteus, hamstring and iliopsoas tendons appear normal. Severe lower paraspinous muscle edema, better evaluated on recent lumbar spine MRI. Other findings Miscellaneous: The visualized internal pelvic contents appear unremarkable. Trace free fluid in the pelvis, nonspecific. IMPRESSION: 1. No acute abnormality of the right hip. No evidence of septic arthritis. 2. Severe lower paraspinous muscle edema, better evaluated on recent lumbar spine MRI. Electronically Signed   By: Obie Dredge M.D.   On: 07/30/2020 16:44   IR US Guide Bx Asp/Drain  Result Date: 07/30/2020 INDICATION: 40 year old gentleman with paraspinal inflammation and abscess presents today measure radiology for imaging guided aspiration. EXAM: Ultrasound-guided aspiration of paraspinal abscess MEDICATIONS: Cefazolin 2 g IV ANESTHESIA/SEDATION: Moderate (conscious) sedation was employed during this procedure. A total of Versed 2 mg was administered intravenously. Moderate Sedation Time: 18 minutes. The patient's level of consciousness and vital signs were monitored continuously by radiology nursing  throughout the procedure under my direct supervision. COMPLICATIONS: None immediate. PROCEDURE: Informed written consent was obtained from the patient after a thorough discussion of the procedural risks, benefits and alternatives. All questions were addressed. Maximal Sterile Barrier Technique was utilized including caps, mask, sterile gowns, sterile gloves, sterile drape, hand hygiene and skin antiseptic. A timeout was performed prior to the initiation of the procedure. Ultrasound evaluation of the lower lumbar paraspinal region demonstrated multiple areas of inflammation and 1 abscess. The overlying skin was prepped and draped in usual fashion. Sterile ultrasound probe cover and sterile ultrasound gel were utilized throughout the procedure. Subcutaneous lidocaine was administered for local anesthesia. Using continuous ultrasound guidance, 6 [place] Yueh needle was advanced into the abscess in the left paraspinal region and 15 mL of purulent material was aspirated. Samples were sent for Gram stain and culture. Needle was withdrawn and hemostasis achieved with 2 minutes manual compression. Patient the procedure well without complication. IMPRESSION: Ultrasound-guided aspiration of left L4 paraspinal  abscess as above. Electronically Signed   By: Acquanetta BellingFarhaan  Mir M.D.   On: 07/30/2020 17:41   DG CHEST PORT 1 VIEW  Result Date: 07/30/2020 CLINICAL DATA:  40 year old male with cough and bacteremia. Patient uncooperative. EXAM: PORTABLE CHEST 1 VIEW COMPARISON:  Chest radiographs 07/03/2013 and earlier. FINDINGS: Portable rotated supine view at 0444 hours. The lower chest is not completely visible. Lung volumes appear stable and at the upper limits of normal. Mediastinal contours are within normal limits. Visualized tracheal air column is within normal limits. No pneumothorax, pulmonary edema or focal pulmonary opacity on this image. No acute osseous abnormality identified. IMPRESSION: Decreased patient cooperation  resulting in rotated view not fully including the lung bases. No acute cardiopulmonary abnormality identified. Electronically Signed   By: Odessa FlemingH  Hall M.D.   On: 07/30/2020 05:10   ECHOCARDIOGRAM COMPLETE  Result Date: 07/27/2020    ECHOCARDIOGRAM REPORT   Patient Name:   [facility] Date of Exam: 07/27/2020 Medical Rec #:  [id number]       Height:       70.0 in Accession #:    [id number]      Weight:       150.0 lb Date of Birth:  Jun 15, 1981       BSA:          1.847 m Patient Age:    39 years        BP:           112/68 mmHg Patient Gender: M               HR:           117 bpm. Exam Location:  Inpatient Procedure: 2D Echo Indications:    Bacteremia  History:        Patient has no prior history of Echocardiogram examinations. IV                 Drug User.  Sonographer:    Thurman Coyerasey Kirkpatrick RDCS (AE) Referring Phys: [id number] Terrilee FilesMICHAEL C BUTLER  Sonographer Comments: Image acquisition challenging due to uncooperative patient. IMPRESSIONS  1. Left ventricular ejection fraction, by estimation, is 55 to 60%. The left ventricle has normal function. The left ventricle has no regional wall motion abnormalities. Left ventricular diastolic parameters were normal.  2. Right ventricular systolic function is normal. The right ventricular size is normal.  3. The mitral valve is normal in structure. No evidence of mitral valve regurgitation. No evidence of mitral stenosis.  4. The aortic valve has an indeterminant number of cusps. Aortic valve regurgitation is not visualized. No aortic stenosis is present.  5. The inferior vena cava is normal in size with greater than 50% respiratory variability, suggesting right atrial pressure of 3 mmHg. FINDINGS  Left Ventricle: Left ventricular ejection fraction, by estimation, is 55 to 60%. The left ventricle has normal function. The left ventricle has no regional wall motion abnormalities. The left ventricular internal cavity size was normal in size. There is  no left ventricular hypertrophy.  Left ventricular diastolic parameters were normal. Right Ventricle: The right ventricular size is normal.Right ventricular systolic function is normal. Left Atrium: Left atrial size was normal in size. Right Atrium: Right atrial size was normal in size. Pericardium: There is no evidence of pericardial effusion. Mitral Valve: The mitral valve is normal in structure. No evidence of mitral valve regurgitation. No evidence of mitral valve stenosis. Tricuspid Valve: The tricuspid valve is normal in structure. Tricuspid valve regurgitation is trivial.  No evidence of tricuspid stenosis. Aortic Valve: The aortic valve has an indeterminant number of cusps. Aortic valve regurgitation is not visualized. No aortic stenosis is present. Pulmonic Valve: The pulmonic valve was not well visualized. Pulmonic valve regurgitation is not visualized. No evidence of pulmonic stenosis. Aorta: The aortic root is normal in size and structure. Venous: The inferior vena cava is normal in size with greater than 50% respiratory variability, suggesting right atrial pressure of 3 mmHg. IAS/Shunts: The interatrial septum is aneurysmal. No atrial level shunt detected by color flow Doppler.  LEFT VENTRICLE PLAX 2D LVIDd:         4.70 cm LVIDs:         3.30 cm LV PW:         0.80 cm LV IVS:        0.70 cm LVOT diam:     2.20 cm LVOT Area:     3.80 cm  RIGHT VENTRICLE TAPSE (M-mode): 1.5 cm LEFT ATRIUM             Index       RIGHT ATRIUM           Index LA diam:        2.70 cm 1.46 cm/m  RA Area:     11.20 cm LA Vol (A2C):   19.9 ml 10.77 ml/m RA Volume:   21.90 ml  11.86 ml/m LA Vol (A4C):   12.9 ml 6.98 ml/m LA Biplane Vol: 17.1 ml 9.26 ml/m   AORTA Ao Root diam: 3.20 cm  SHUNTS Systemic Diam: 2.20 cm Olga Millers MD Electronically signed by Olga Millers MD Signature Date/Time: 07/27/2020/1:53:31 PM    Final     Labs:  CBC: Recent Labs    07/30/20 0151 07/30/20 0615 07/31/20 0205 08/01/20 0227  WBC 15.3* 16.3* 16.0* 10.9*  HGB  13.9 14.2 15.1 14.9  HCT 40.7 43.1 43.5 44.0  PLT 407* 411* 487* 411*    COAGS: Recent Labs    07/27/20 1340  INR 1.1    BMP: Recent Labs    07/30/20 0151 07/30/20 0615 07/31/20 0205 08/01/20 0227  NA 133* 133* 133* 131*  K 4.1 3.9 3.8 4.0  CL 94* 95* 96* 93*  CO2 GLUCOSE 100* 147* 119* 111*  BUN CALCIUM 8.6* 8.6* 8.9 8.6*  CREATININE 0.63 0.65 0.62 0.66  GFRNONAA >60 >60 >60 >60    LIVER FUNCTION TESTS: Recent Labs    07/30/20 0151 07/30/20 0615 07/31/20 0205 08/01/20 0227  BILITOT 0.4 0.3 0.1* 0.4  AST 111* 130* 107* 230*  ALT 130* 140* 131* 176*  ALKPHOS 70 69 72 94  PROT 6.3* 6.3* 6.5 6.5  ALBUMIN 2.6* 2.5* 2.7* 2.6*     Assessment and Plan:  40 y.o. male inpatient. History of IV drug abuse. Presented to the ED at [facility] with lower back pain, weakness and difficulty walking. Found to have leukocytosis and elevated inflammatory markers with a L4 paraspinal abscess. On 2.11.22 IR aspirated the abscess. CT lumbar from 2.12.22 shows abscess to the posterior soft tissues from L4 to S1. Team is requesting aspiration of paraspinal abscess.   Patient is on xarelto. WBC 10.9, AST 230, ALT 176, Sodium 131. Gram stain from aspiration shows abundant WBC with few gram positive cocci. Cultures show staphylococcus. Last recorded temperature 100.4  IR consulted for possible lumbar paraspinal aspiration. Case has been reviewed and procedure approved by Dr. Miles Costain. Patient tentatively scheduled for  2.14.22.  Team instructed to: Keep Patient to be NPO after midnight Hold xarelto for 24 hours prior to procedure.  IR will call patient when ready.  Risks and benefits of aspiration of lumbar paraspinal abscess was discussed with the patient and/or patient's family including, but not limited to bleeding, infection, damage to adjacent structures or low yield requiring additional tests.  All of the questions were answered and there is agreement to  proceed.  Consent signed and in chart.      Thank you for this interesting consult.  I greatly enjoyed meeting George Summers and look forward to participating in their care.  A copy of this report was sent to the requesting provider on this date.  Electronically Signed: Alene Mires, NP 08/01/2020, 10:41 AM   I spent a total of 40 Minutes    in face to face in clinical consultation, greater than 50% of which was counseling/coordinating care for lumbar paraspinal abscess

## 2020-08-02 ENCOUNTER — Inpatient Hospital Stay (HOSPITAL_COMMUNITY): Payer: Self-pay

## 2020-08-02 ENCOUNTER — Inpatient Hospital Stay (HOSPITAL_COMMUNITY): Payer: Self-pay | Admitting: Anesthesiology

## 2020-08-02 ENCOUNTER — Encounter (HOSPITAL_COMMUNITY): Payer: Self-pay | Admitting: Internal Medicine

## 2020-08-02 ENCOUNTER — Encounter (HOSPITAL_COMMUNITY)
Admission: EM | Disposition: A | Discharge status: Home / Self Care | Payer: Self-pay | Source: Home / Self Care | Attending: Internal Medicine

## 2020-08-02 DIAGNOSIS — R7401 Elevation of levels of liver transaminase levels: Secondary | ICD-10-CM

## 2020-08-02 DIAGNOSIS — I511 Rupture of chordae tendineae, not elsewhere classified: Secondary | ICD-10-CM

## 2020-08-02 DIAGNOSIS — M5459 Other low back pain: Secondary | ICD-10-CM

## 2020-08-02 DIAGNOSIS — R7881 Bacteremia: Secondary | ICD-10-CM

## 2020-08-02 DIAGNOSIS — Z720 Tobacco use: Secondary | ICD-10-CM

## 2020-08-02 DIAGNOSIS — M4626 Osteomyelitis of vertebra, lumbar region: Secondary | ICD-10-CM

## 2020-08-02 HISTORY — PX: BUBBLE STUDY: SHX6837

## 2020-08-02 HISTORY — PX: TEE WITHOUT CARDIOVERSION: SHX5443

## 2020-08-02 LAB — COMPREHENSIVE METABOLIC PANEL
ALT: 430 U/L — ABNORMAL HIGH (ref 0–44)
AST: 686 U/L — ABNORMAL HIGH (ref 15–41)
Albumin: 2.7 g/dL — ABNORMAL LOW (ref 3.5–5.0)
Alkaline Phosphatase: 135 U/L — ABNORMAL HIGH (ref 38–126)
Anion gap: 16 — ABNORMAL HIGH (ref 5–15)
BUN: 10 mg/dL (ref 6–20)
CO2: 25 mmol/L (ref 22–32)
Calcium: 8.7 mg/dL — ABNORMAL LOW (ref 8.9–10.3)
Chloride: 91 mmol/L — ABNORMAL LOW (ref 98–111)
Creatinine, Ser: 0.83 mg/dL (ref 0.61–1.24)
GFR, Estimated: >60 mL/min (ref >60)
Glucose, Bld: 74 mg/dL (ref 70–99)
Potassium: 3.9 mmol/L (ref 3.5–5.1)
Sodium: 132 mmol/L — ABNORMAL LOW (ref 135–145)
Total Bilirubin: 0.5 mg/dL (ref 0.3–1.2)
Total Protein: 6.8 g/dL (ref 6.5–8.1)

## 2020-08-02 LAB — CBC
HCT: 48.2 % (ref 39.0–52.0)
Hemoglobin: 15.7 g/dL (ref 13.0–17.0)
MCH: 29.4 pg (ref 26.0–34.0)
MCHC: 32.6 g/dL (ref 30.0–36.0)
MCV: 90.3 fL (ref 80.0–100.0)
Platelets: 338 10*3/uL (ref 150–400)
RBC: 5.34 MIL/uL (ref 4.22–5.81)
RDW: 15.1 % (ref 11.5–15.5)
WBC: 10 10*3/uL (ref 4.0–10.5)
nRBC: 0 % (ref 0.0–0.2)

## 2020-08-02 IMAGING — US US ABDOMEN LIMITED RUQ/ASCITES
1 series · 14 of 25 positions shown · non-contrast
Comparison: None.

CLINICAL DATA: Transaminitis

EXAM:
ULTRASOUND ABDOMEN LIMITED RIGHT UPPER QUADRANT

[Series 1: us abdomen limited ruq (liver/gb) · 14 of 31 slices shown]
[im 1/31]
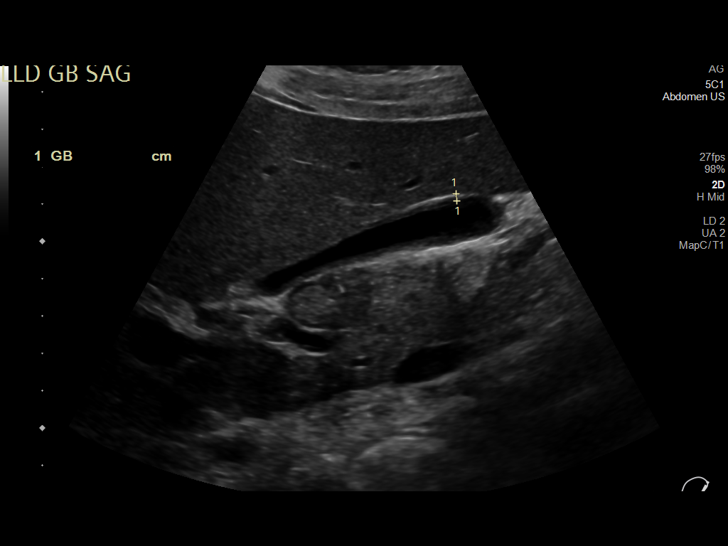
[im 3/31]
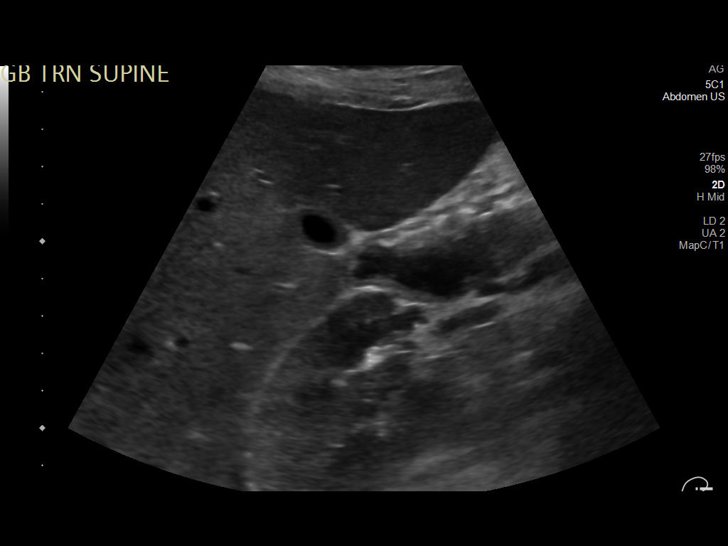
[im 6/31]
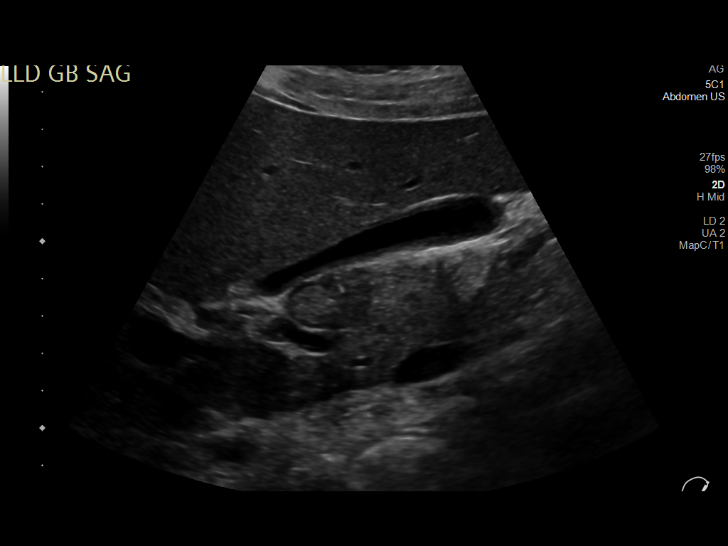
[im 8/31]
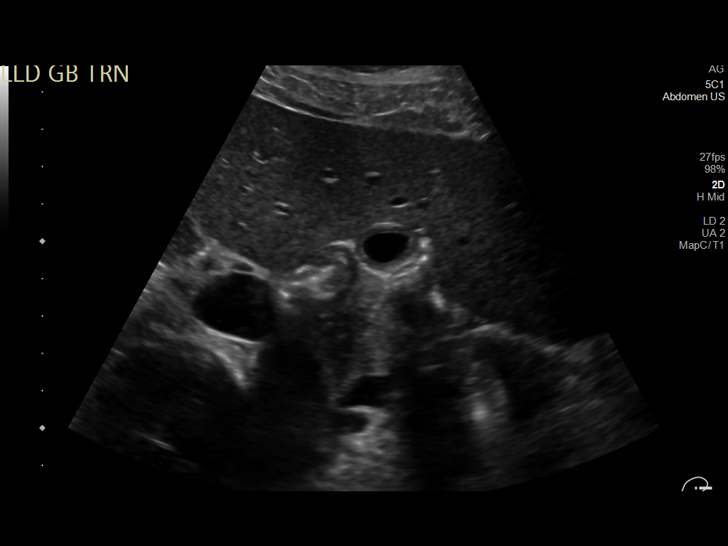
[im 11/31]
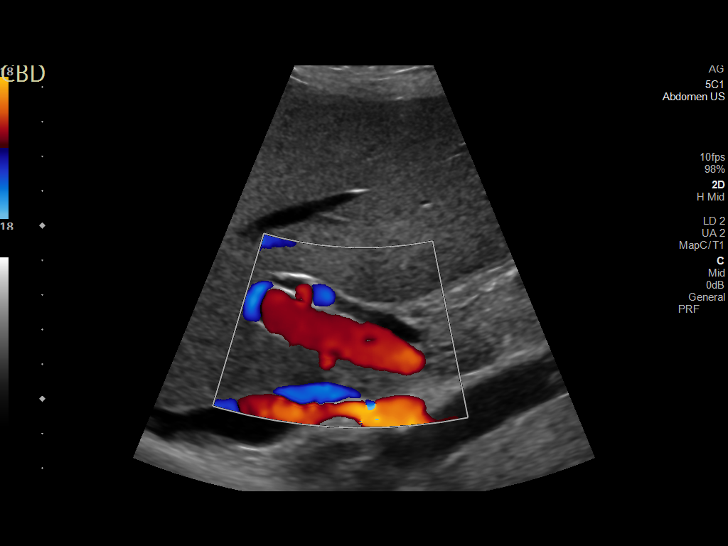
[im 12/31]
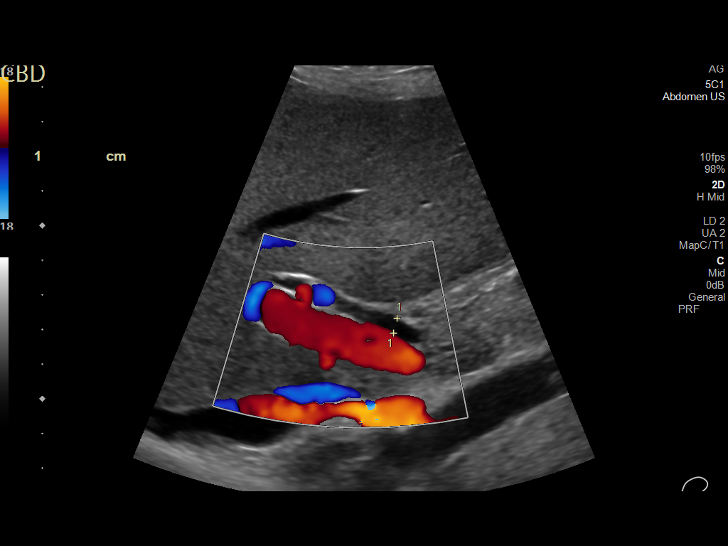
[im 14/31]
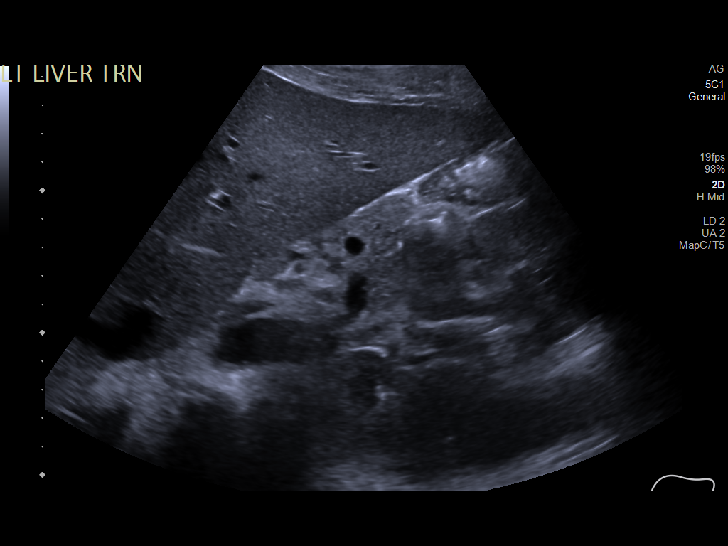
[im 17/31]
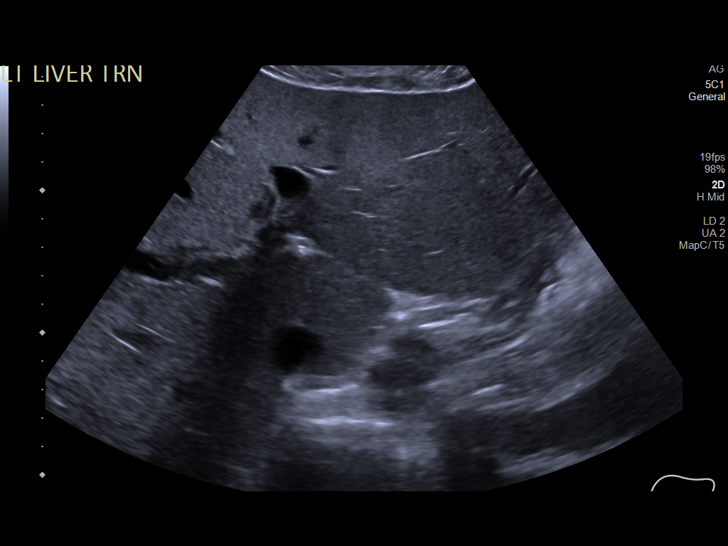
[im 19/31]
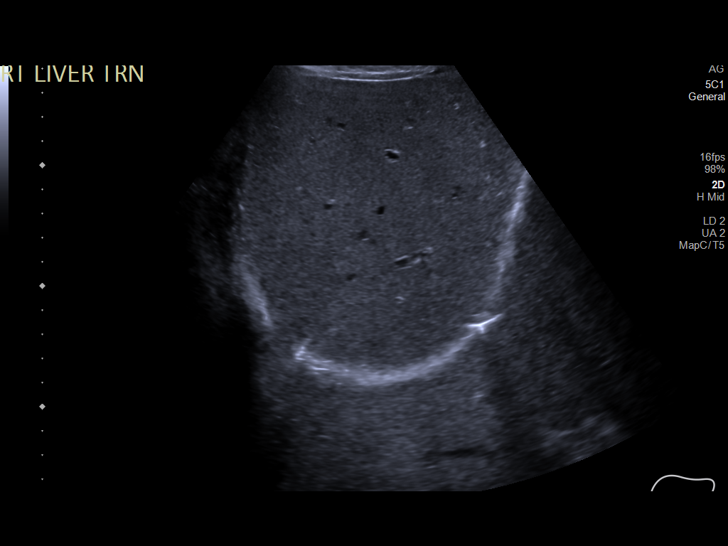
[im 21/31]
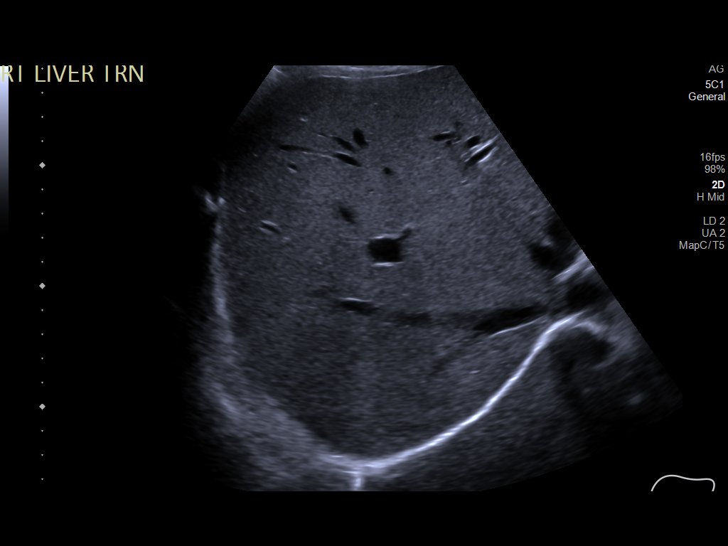
[im 23/31]
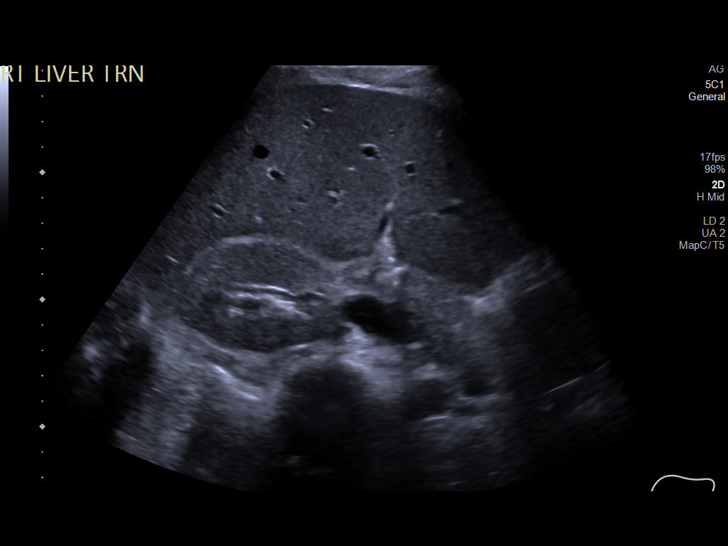
[im 26/31]
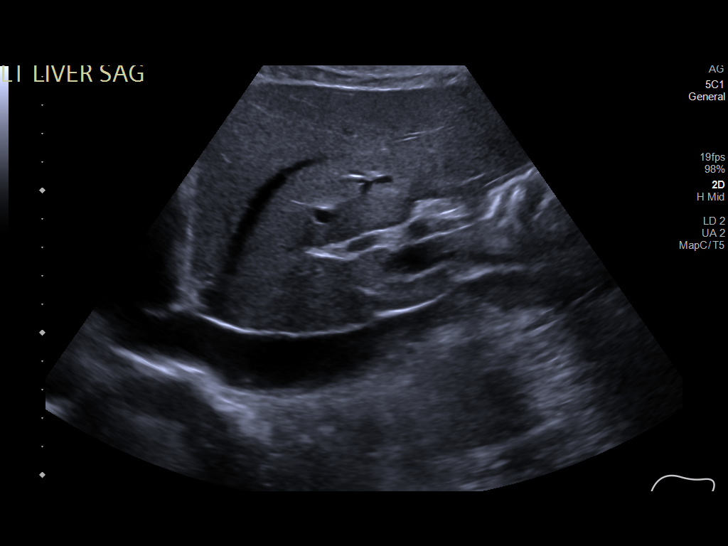
[im 28/31]
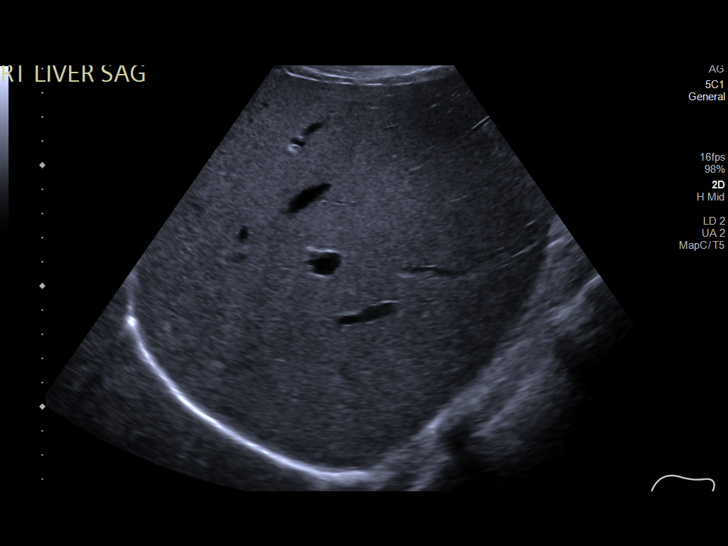
[im 31/31]
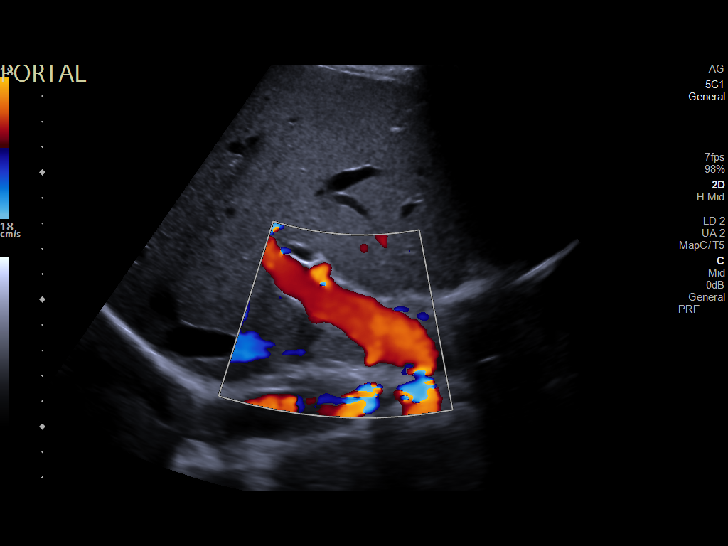

[14 of 25 positions shown; findings below may reference images not displayed]

FINDINGS: Gallbladder:

No gallstones or wall thickening visualized. No sonographic Murphy
sign noted by sonographer.

Common bile duct:

Diameter: 4 mm

Liver:

No focal lesion identified. Within normal limits in parenchymal
echogenicity. Portal vein is patent on color Doppler imaging with
normal direction of blood flow towards the liver.

Other: None.
IMPRESSION: Unremarkable right upper quadrant ultrasound.

## 2020-08-02 IMAGING — US US IMAGE GUIDED DRAINAGE BY PERCUTANEOUS CATHETER
1 series · 9 of 9 positions shown · non-contrast
Comparison: none

INDICATION: 39-year-old male with a history paraspinal abscess

[Series 1: us image guided drainage by percutaneous catheter · 9 of 9 slices shown]
[im 1/9]
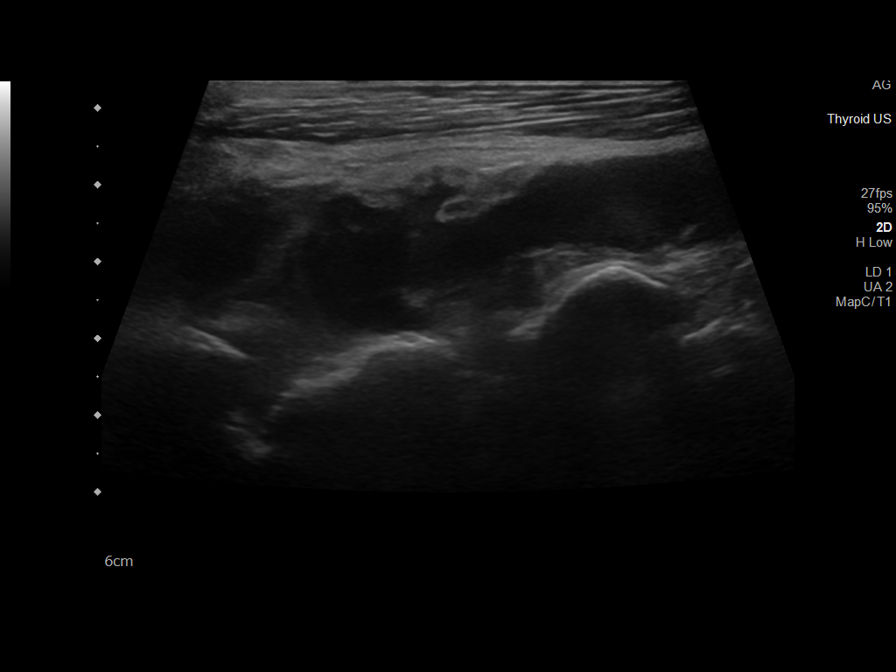
[im 2/9]
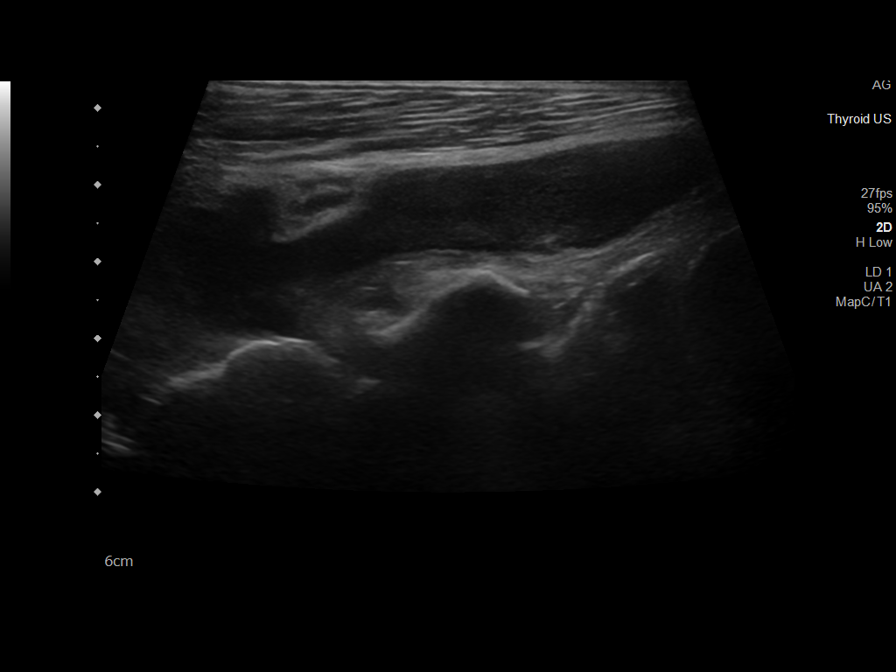
[im 3/9]
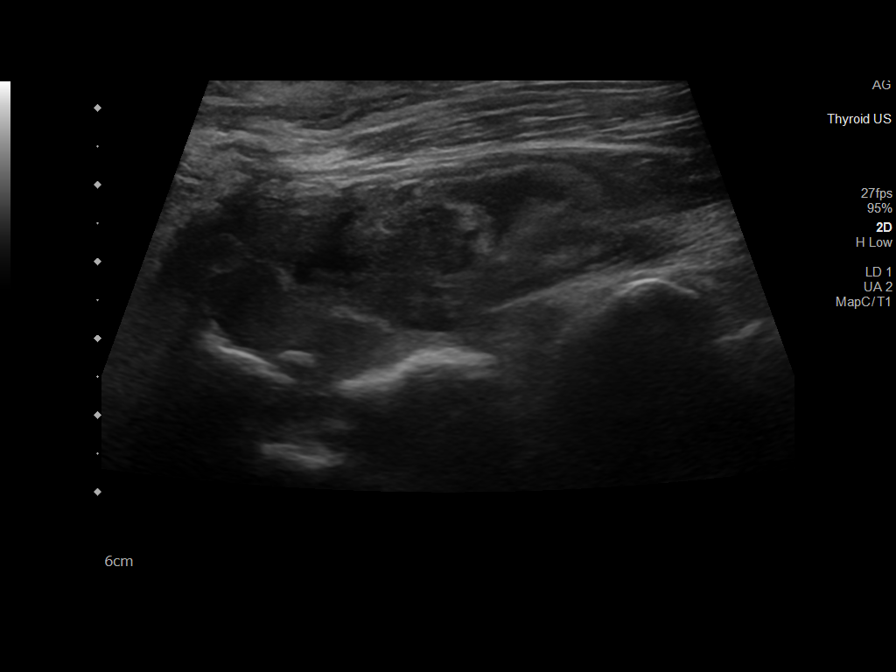
[im 4/9]
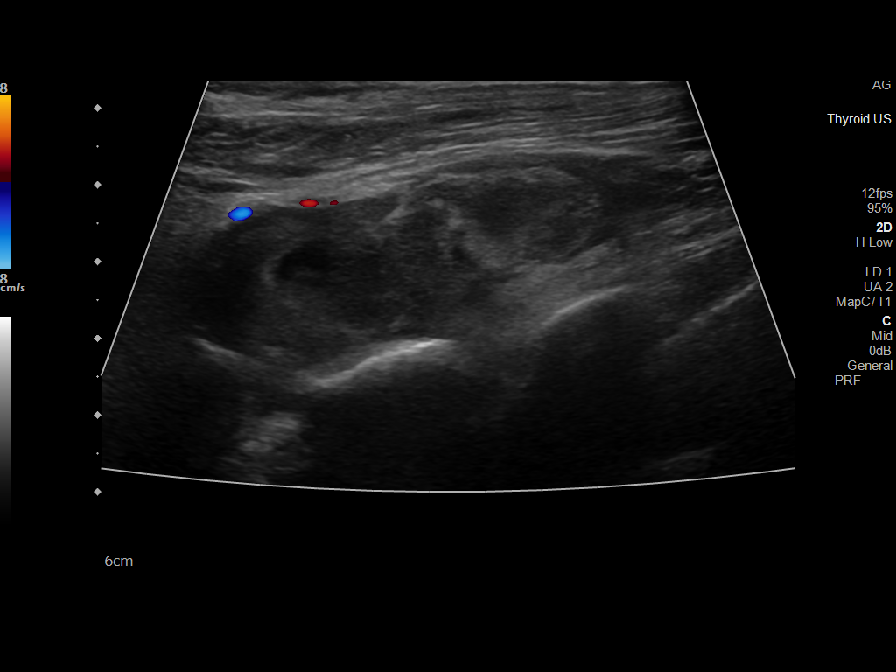
[im 5/9]
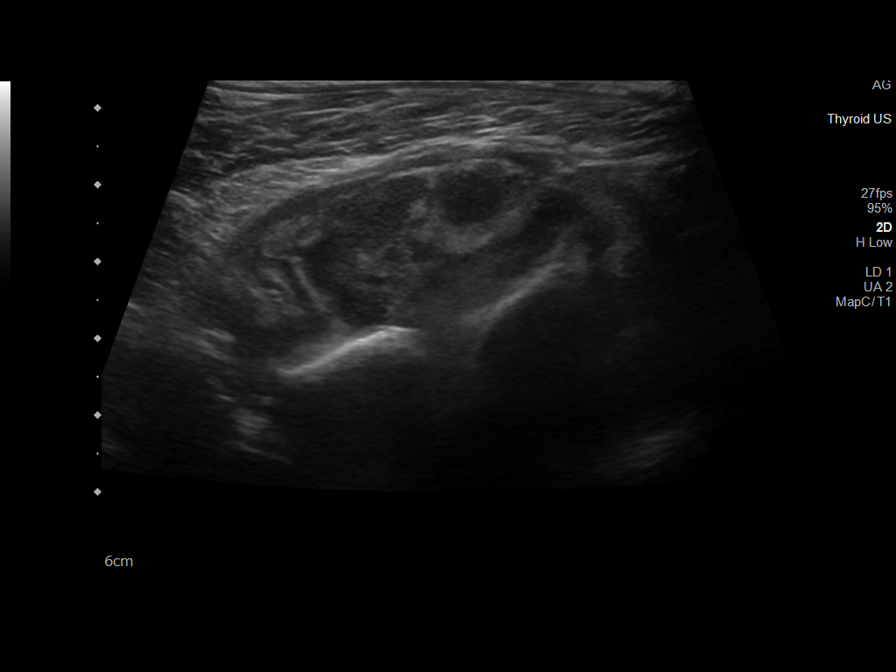
[im 6/9]
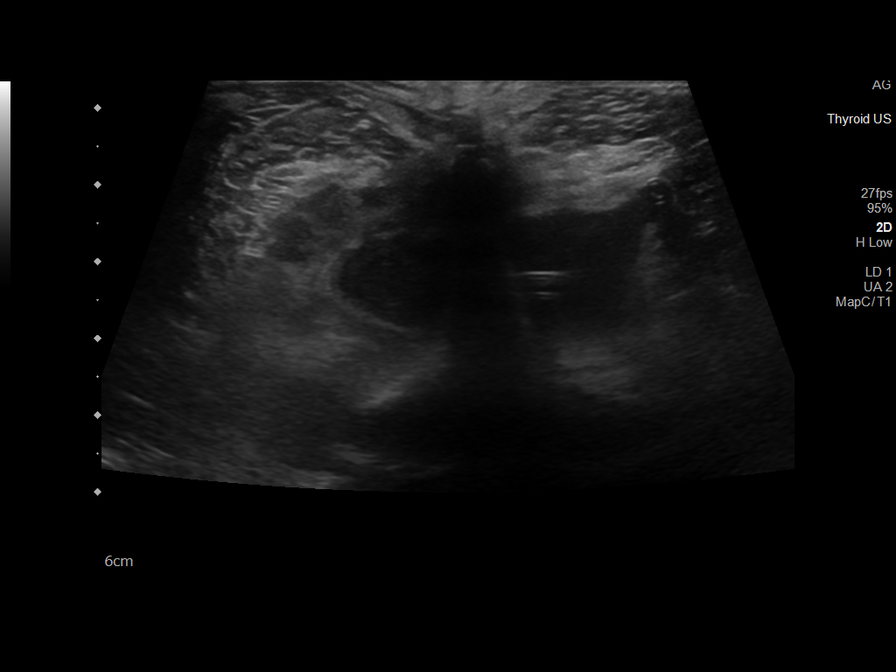
[im 7/9]
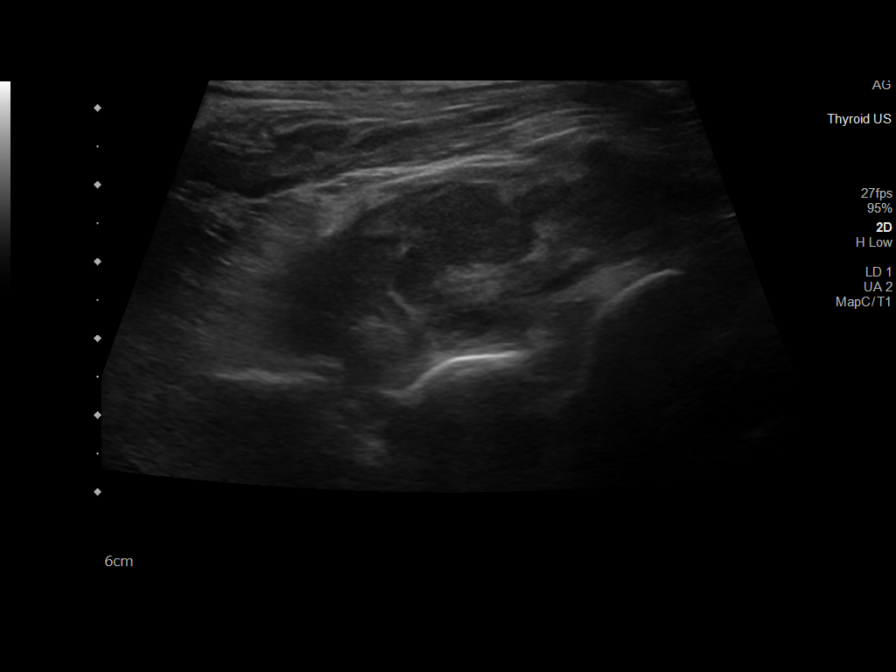
[im 8/9]
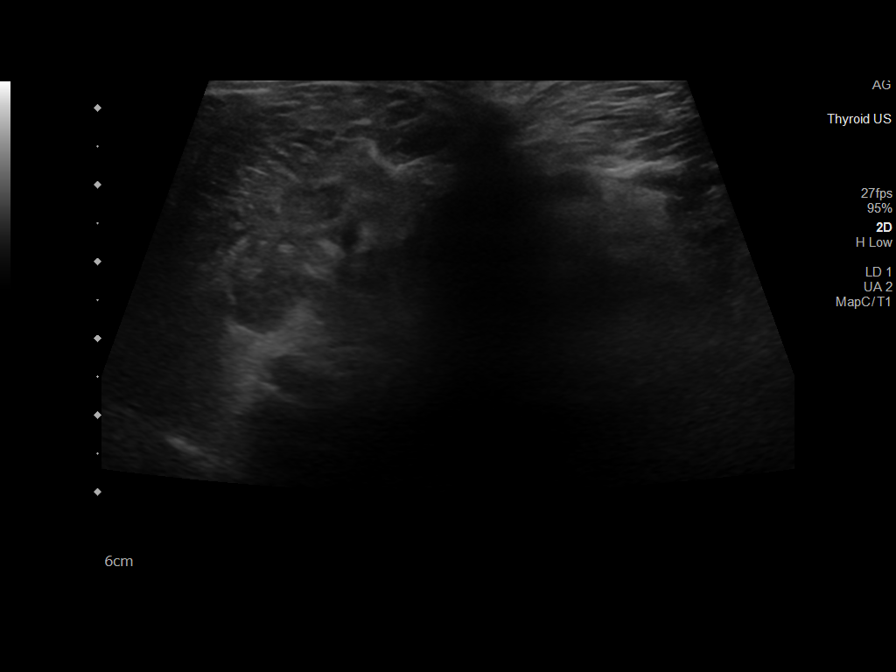
[im 9/9]
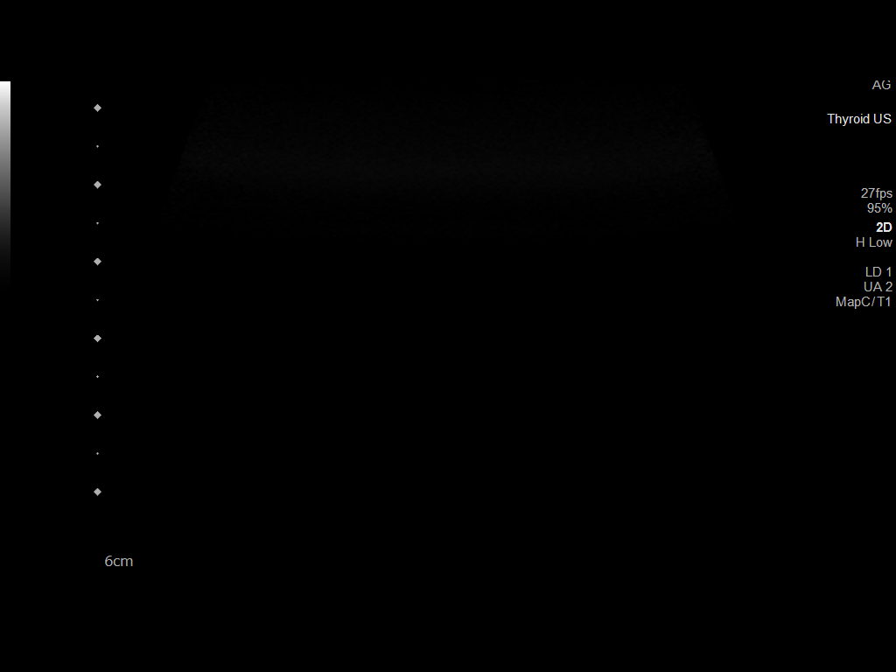

[9 of 9 positions shown; findings below may reference images not displayed]

EXAM:
IMAGE GUIDED ABSCESS ASPIRATION

MEDICATIONS:
The patient is currently admitted to the hospital and receiving
intravenous antibiotics. The antibiotics were administered within an
appropriate time frame prior to the initiation of the procedure.

ANESTHESIA/SEDATION:
Fentanyl 50 mcg IV; Versed 1.0 mg IV

Moderate Sedation Time:  12 MINUTES

The patient was continuously monitored during the procedure by the
interventional radiology nurse under my direct supervision.

COMPLICATIONS:
NONE

PROCEDURE:
Informed written consent was obtained from the patient after a
thorough discussion of the procedural risks, benefits and
alternatives. All questions were addressed. Maximal Sterile Barrier
Technique was utilized including caps, mask, sterile gowns, sterile
gloves, sterile drape, hand hygiene and skin antiseptic. A timeout
was performed prior to the initiation of the procedure.

Patient position prone position on the ultrasound stretcher. Scout
images were acquired.

The patient is prepped and draped in the usual sterile fashion.

1% lidocaine was used for local anesthesia.

Using ultrasound guidance, a 10 French drain was advanced with
trocar technique into the most liquified portion of the abscess in
the right paraspinal musculature. Aspiration of approximately 20 cc
of purulent fluid was performed. Catheter was removed and a bandage
was placed.

1% lidocaine was used for local anesthesia overlying the left-sided
component. Yueh needle was advanced under ultrasound guidance with
aspiration of approximately 10 cc.

Sterile bandage was placed after removal of the Yueh catheter.

Patient tolerated the procedure with no complications. No
significant blood loss
IMPRESSION: Status post ultrasound-guided aspiration of paraspinal abscess in
the lumbar region with aspiration approximately 30 cc of purulent
material. Previous aspiration yielded adequate culture so no
additional sample was sent.

## 2020-08-02 SURGERY — ECHOCARDIOGRAM, TRANSESOPHAGEAL
Anesthesia: Monitor Anesthesia Care

## 2020-08-02 MED ORDER — MIDAZOLAM HCL 2 MG/2ML IJ SOLN: INTRAMUSCULAR | Status: DC | PRN

## 2020-08-02 MED ORDER — MIDAZOLAM HCL 2 MG/2ML IJ SOLN
INTRAMUSCULAR | Status: DC | PRN
  Administered 2020-08-02: 2 mg via INTRAVENOUS

## 2020-08-02 MED ORDER — MIDAZOLAM HCL 2 MG/2ML IJ SOLN
INTRAMUSCULAR | Status: AC
  Filled 2020-08-02: qty 2

## 2020-08-02 MED ORDER — MIDAZOLAM HCL 2 MG/2ML IJ SOLN
INTRAMUSCULAR | Status: AC | PRN
  Administered 2020-08-02 (×2): 0.5 mg via INTRAVENOUS

## 2020-08-02 MED ORDER — SODIUM CHLORIDE 0.9 % IV SOLN: INTRAVENOUS | Status: DC

## 2020-08-02 MED ORDER — LIDOCAINE 2% (20 MG/ML) 5 ML SYRINGE
INTRAMUSCULAR | Status: DC | PRN
  Administered 2020-08-02: 80 mg via INTRAVENOUS

## 2020-08-02 MED ORDER — FENTANYL CITRATE (PF) 100 MCG/2ML IJ SOLN
INTRAMUSCULAR | Status: AC
  Filled 2020-08-02: qty 2

## 2020-08-02 MED ORDER — PROPOFOL 10 MG/ML IV BOLUS
INTRAVENOUS | Status: DC | PRN
  Administered 2020-08-02 (×2): 30 mg via INTRAVENOUS

## 2020-08-02 MED ORDER — LIDOCAINE HCL (PF) 1 % IJ SOLN
INTRAMUSCULAR | Status: AC
  Filled 2020-08-02: qty 30

## 2020-08-02 MED ORDER — LACTATED RINGERS IV SOLN: INTRAVENOUS | Status: DC | PRN

## 2020-08-02 MED ORDER — PROPOFOL 500 MG/50ML IV EMUL
INTRAVENOUS | Status: DC | PRN
  Administered 2020-08-02: 200 ug/kg/min via INTRAVENOUS

## 2020-08-02 MED ORDER — RAMELTEON 8 MG PO TABS
8.0000 mg | ORAL_TABLET | Freq: Every day | ORAL | Status: DC
  Administered 2020-08-02 – 2020-08-05 (×4): 8 mg via ORAL
  Filled 2020-08-02 (×5): qty 1

## 2020-08-02 MED ORDER — FENTANYL CITRATE (PF) 100 MCG/2ML IJ SOLN
INTRAMUSCULAR | Status: AC | PRN
  Administered 2020-08-02 (×2): 25 ug via INTRAVENOUS

## 2020-08-02 MED ORDER — FENTANYL CITRATE (PF) 100 MCG/2ML IJ SOLN
INTRAMUSCULAR | Status: DC | PRN
  Administered 2020-08-02: 50 ug via INTRAVENOUS

## 2020-08-02 NOTE — Sedation Documentation (Signed)
Pt lying prone for procedure.

## 2020-08-02 NOTE — Progress Notes (Signed)
Patient did placed on continuous pulse ox due to dilaudid PCA and not being monitored. Patient also requested sleep medication MD on call notified and the NT informed systolic BP 97 this was also reported to MD.Will continue to monitor. Ilean Skill LPN

## 2020-08-02 NOTE — CV Procedure (Signed)
    PROCEDURE NOTE:  Procedure:  Transesophageal echocardiogram Operator:  Armanda Magic, MD Indications:  Bacteremia Complications: None  During this procedure the patient is administered a total of Propofol 370 mg to achieve and maintain moderate conscious sedation.  The patient's heart rate, blood pressure, and oxygen saturation are monitored continuously during the procedure by anesthesia.   Results: Normal LV size and function Normal RV size and function Normal RA Normal LA Normal TV with trivial TR Normal PV Normal MV leaflets.  There is a small thin mobile filamentous structure that likely represents a redundant chordae tendinea.  There are no shaggy denisities on the MV leaflets or annulus.  Trivial MR is present.  Normal trileaflet AV Normal interatrial septum with no evidence of shunt by colorflow dopper  Normal thoracic and ascending aorta.  The patient tolerated the procedure well and was transferred back to their room in stable condition.  Signed: Armanda Magic, MD [facility]

## 2020-08-02 NOTE — Interval H&P Note (Signed)
History and Physical Interval Note:  08/02/2020 7:39 AM  George Summers  has presented today for surgery, with the diagnosis of BACTEREMIA IV DRUG USE.  The various methods of treatment have been discussed with the patient and family. After consideration of risks, benefits and other options for treatment, the patient has consented to  Procedure(s): TRANSESOPHAGEAL ECHOCARDIOGRAM (TEE) (N/A) as a surgical intervention.  The patient's history has been reviewed, patient examined, no change in status, stable for surgery.  I have reviewed the patient's chart and labs.  Questions were answered to the patient's satisfaction.     Armanda Magic

## 2020-08-02 NOTE — Progress Notes (Signed)
68ml of dilaudid from PCA wasted at this time with charge nurse Alona Bene A.

## 2020-08-02 NOTE — Progress Notes (Signed)
[facility]    Date of Admission:  07/29/2020   Total days of antibiotics 4/cefazolin           ID: George Summers is a 40 y.o. male with Principal Problem:   MSSA bacteremia Active Problems:   Paraspinal abscess (HCC)   Heroin withdrawal (HCC)    Subjective: Still having significant back pain, unable to ambulate any long distance. He had TEE this morning that noted:  There is a small thin mobile filamentous structure that likely represents a redundant chordae tendinea.  There are no shaggy denisities on the MV leaflets or annulus.  Trivial MR is present.    Medications:  . HYDROmorphone   Intravenous Q4H  . nicotine  14 mg Transdermal Daily    Objective: Vital signs in last 24 hours: Temp:  [97.5 F (36.4 C)-101.4 F (38.6 C)] 97.5 F (36.4 C) (02/14 0921) Pulse Rate:  [79-91] 88 (02/14 0921) Resp:  [12-23] 19 (02/14 0921) BP: (91-117)/(53-75) 117/73 (02/14 0921) SpO2:  [96 %-100 %] 97 % (02/14 0921)  Physical Exam  Constitutional: He is oriented to person, place, and time. He appears chronically ill and thin frame. No distress.  HENT:  Mouth/Throat: Oropharynx is clear and moist. No oropharyngeal exudate.  Cardiovascular: Normal rate, regular rhythm and normal heart sounds. Exam reveals no gallop and no friction rub.  No murmur heard.  Pulmonary/Chest: Effort normal and breath sounds normal. No respiratory distress. He has no wheezes.  Abdominal: Soft. Bowel sounds are normal. He exhibits no distension. There is no tenderness.  Neurological: He is alert and oriented to person, place, and time.  Skin: Skin is warm and dry. No rash noted. No erythema.  Psychiatric: He has a normal mood and affect. His behavior is normal.    Lab Results Recent Labs    08/01/20 0227 08/02/20 0358  WBC 10.9* 10.0  HGB 14.9 15.7  HCT 44.0 48.2  NA 131* 132*  K 4.0 3.9  CL 93* 91*  CO2 28 25  BUN 12 10  CREATININE 0.66 0.83   Liver  Panel Recent Labs    08/01/20 0227 08/02/20 0358  PROT 6.5 6.8  ALBUMIN 2.6* 2.7*  AST 230* 686*  ALT 176* 430*  ALKPHOS 94 135*  BILITOT 0.4 0.5   Sedimentation Rate Lab Results  Component Value Date   ESRSEDRATE 60 (H) 07/27/2020    Microbiology: 2/8 blood cx MSSA 2/11 paraspinal aspirate = MSSA 2/11 blood cx = NGTD  Studies/Results: Paraspinal edema and enhancement posteriorly from L4 to sacral levels. This is greatest at the L4 level where there are peripherally enhancing abscesses. For example on the left measuring up to 2.9 cm craniocaudally (series 6, image 10). There is dorsal epidural enhancement at the L4 and L5 levels without abscess.  Assessment/Plan: transaminitis = AST/ALT trending up during hospitalization with 10-12x ULN. will stop tylenol (only getting 650mg  daily), but also need to get imaging to see if having obstruction (CT vs MR). Patient is not on any meds to cause this picture. Not having any jaundice on exam.  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 Vegetation. Will plan to treat for 6 wk of antibiotics. Would aim for at least 14 days of IV therapy.  Heavy opiate use = currently on dilaudid PCA, eventually will need conversion.  [facility] Cell: [phone] Pager: [phone]  08/02/2020, 11:11 AM

## 2020-08-02 NOTE — Procedures (Signed)
Interventional Radiology Procedure Note  Procedure:   US guided aspiration of phlegmon/abscess in the lumbar muscles.  ~30cc purulent material aspirated.  Prior culture is positive 2/11.  .  Complications: None Recommendations:  - routine wound care -I/D required for any further decompression given the phlegmon component and the epidural component.     Signed,  Yvone Neu. Loreta Ave, DO

## 2020-08-02 NOTE — Anesthesia Procedure Notes (Signed)
Procedure Name: MAC Date/Time: 08/02/2020 8:01 AM Performed by: Imagene Riches, CRNA Pre-anesthesia Checklist: Patient identified, Emergency Drugs available, Suction available, Patient being monitored and Timeout performed Patient Re-evaluated:Patient Re-evaluated prior to induction Oxygen Delivery Method: Nasal cannula

## 2020-08-02 NOTE — Transfer of Care (Signed)
Immediate Anesthesia Transfer of Care Note  Patient: George Summers  Procedure(s) Performed: TRANSESOPHAGEAL ECHOCARDIOGRAM (TEE) (N/A ) BUBBLE STUDY  Patient Location: Endoscopy Unit  Anesthesia Type:MAC  Level of Consciousness: drowsy  Airway & Oxygen Therapy: Patient Spontanous Breathing and Patient connected to nasal cannula oxygen  Post-op Assessment: Report given to RN and Post -op Vital signs reviewed and stable  Post vital signs: Reviewed and stable  Last Vitals:  Vitals Value Taken Time  BP 97/55 08/02/20 0828  Temp    Pulse 92 08/02/20 0828  Resp 26 08/02/20 0828  SpO2 97 % 08/02/20 0828  Vitals shown include unvalidated device data.  Last Pain:  Vitals:   08/02/20 0700  TempSrc: Temporal  PainSc: 8          Complications: No complications documented.

## 2020-08-02 NOTE — Progress Notes (Signed)
  Echocardiogram Echocardiogram Transesophageal has been performed.  Gerda Diss 08/02/2020, 8:36 AM

## 2020-08-02 NOTE — Progress Notes (Signed)
Pt disconnected from PCA at this time. RN from endo is here to pick him up from TEE. I told pt he could go down with the PCA, but he stated he was fine going down without it. Notified day shift charge nurse Marcelino Duster.

## 2020-08-02 NOTE — Progress Notes (Signed)
31ml Hydromorphone from PCA was wasted in [place] and witnessed by [employer]. Ilean Skill LPN

## 2020-08-02 NOTE — Plan of Care (Signed)

## 2020-08-02 NOTE — Progress Notes (Signed)
Subjective:   Hospital day:* 3  Overnight event: None  Patient endorsing ongoing back pain that is radiating down to bilateral legs.  He denies numbness or tingling or weakness of bilateral lower extremities.  Report limited range of motion of bilateral hips due to pain.  Denies loss of urinary or bowel control. He also endorses dysuria and urgency.  Notes IV site on R arm hurting. Will discuss with RN.   Objective:  Vital signs in last 24 hours: Vitals:   08/02/20 0001 08/02/20 0400 08/02/20 0451 08/02/20 0600  BP:   109/73   Pulse:      Resp: 15 15 14    Temp:   (!) 101.4 F (38.6 C) 99.7 F (37.6 C)  TempSrc:   Oral Oral  SpO2: 99%  99%   Weight:      Height:       CBC Latest Ref Rng & Units 08/02/2020 08/01/2020 07/31/2020  WBC 4.0 - 10.5 K/uL 10.0 10.9(H) 16.0(H)  Hemoglobin 13.0 - 17.0 g/dL 09/28/2020 85.4 62.7  Hematocrit 39.0 - 52.0 % 48.2 44.0 43.5  Platelets 150 - 400 K/uL 338 411(H) 487(H)   CMP Latest Ref Rng & Units 08/02/2020 08/01/2020 07/31/2020  Glucose 70 - 99 mg/dL 74 09/28/2020) 009(F)  BUN 6 - 20 mg/dL 10 12 7   Creatinine 0.61 - 1.24 mg/dL 818(E 9.93  Sodium 135 - 145 mmol/L 132(L) 131(L) 133(L)  Potassium 3.5 - 5.1 mmol/L 3.9 4.0 3.8  Chloride 98 - 111 mmol/L 91(L) 93(L) 96(L)  CO2 22 - 32 mmol/L 25 28 24   Calcium 8.9 - 10.3 mg/dL 7.16) 9.67) 8.9  Total Protein 6.5 - 8.1 g/dL 6.8 6.5 6.5  Total Bilirubin 0.3 - 1.2 mg/dL 0.5 0.4 )  Alkaline Phos 38 - 126 U/L 135(H) 94 72  AST 15 - 41 U/L 686(H) 230(H) 107(H)  ALT 0 - 44 U/L 430(H) 176(H) 131(H)     Physical Exam  Physical Exam Constitutional:      General: He is in acute distress.  Eyes:     General:        Right eye: No discharge.        Left eye: No discharge.  Cardiovascular:     Rate and Rhythm: Normal rate and regular rhythm.  Pulmonary:     Effort: Pulmonary effort is normal. No respiratory distress.  Abdominal:     General: Bowel sounds are normal.     Tenderness: There is no  abdominal tenderness.  Musculoskeletal:     Right lower leg: No edema.     Left lower leg: No edema.     Comments: Normal strength and sensation of bilateral lower extremity.  Limited right hip flexion due to lower back pain.  Skin:    General: Skin is warm.  Neurological:     General: No focal deficit present.     Mental Status: He is alert.  Psychiatric:        Mood and Affect: Mood normal.     Assessment/Plan: Blomquist is a 40 year old with IV drug use history who was admitted for back pain, found to have a lumbar paraspinal myositis with abscess and MSSA bacteremia,currently treated with IV cefazolin.  Status post abscess aspiration with IR.  ID is onboard with the treatment team.   Principal Problem:   MSSA bacteremia Active Problems:   Paraspinal abscess (HCC)   Heroin withdrawal (HCC)  MSSA Bacteremia  Lumbar Paraspinal Myositis with Abscess  Osteomyelitis L4 -Patient had a  fever Tmax 101.3 in the last 24 hours.  Currently on IV cefazolin.  CT lumbar spine obtained yesterday showed an abscess ~2.5cm in size bilaterally. IR planned for another aspiration procedure today -Also noted to have early abscess formation at L4-L5 level without compression of thecal sac. Neurosurgery consulted.  No focal deficits on exam today.  We will continue to monitor.  -TEE is negative for vegetation.  However there is a small thin mobile filamentous structure that likely represents a redundant chordae tendinea.  Unsure if this is a vegetation.  -Appreciate ID recommendations - Repeat blood cultures, negative to date - Cefazolin 2g IV q8 hours.  Anticipated 6 weeks of treatment.  Goals to have at least 2 weeks of IV antibiotic per ID. - Xarelto 10mg  daily  - Continue Dilaudid PCA pump for pain control.  Will slowly transition off when symptoms are better controlled - Ondansetron 4mg  q6hrs PRN   Elevated Liver Enzymes  AST and ALT significantly elevated today. Alkaline phosphatase  slightly elevated with normal total bilirubin.  HIV and hepatitis(hep B and hep C)labs negative in 2/8.  Suspect current transaminitis is in setting of ongoing infection, however with this significant change in LFT, will obtain RUQ to rule out any abnormalities. - Monitor CMP - Pending RUQ 4/8   Polysubstance Abuse  Continue Dilaudid PCA pump at this time given ongoing infection and abscess. Will transition to as needed Dilaudid when symptoms improve. Plan to start methadone during this admission. - Dilaudid PCA pump w/ Naloxone PRN - Narcan available as needed - Ondansetron 4mg  q6hrs PRN - Diphenhydramine 12.5mg  IV q6hrs PRN  - Nicotine 14mg  patch daily - Hydroxyzine 25mg  tid prn    Code status: Full Code  Diet: Regular  DVT PPx: On Xarelto 10mg  daily IVF: None  Prior to Admission Living Arrangement:Home Anticipated Discharge Location:to be determined Barriers to Discharge:medical treatment  [place], DO 08/02/2020, 7:18 AM Pager: [phone] After 5pm on weekdays and 1pm on weekends: On Call pager [phone]

## 2020-08-02 NOTE — Anesthesia Postprocedure Evaluation (Signed)
Anesthesia Post Note  Patient: George Summers  Procedure(s) Performed: TRANSESOPHAGEAL ECHOCARDIOGRAM (TEE) (N/A ) BUBBLE STUDY     Patient location during evaluation: Endoscopy Anesthesia Type: MAC Level of consciousness: awake and alert Pain management: pain level controlled Vital Signs Assessment: post-procedure vital signs reviewed and stable Respiratory status: spontaneous breathing, nonlabored ventilation, respiratory function stable and patient connected to nasal cannula oxygen Cardiovascular status: blood pressure returned to baseline and stable Postop Assessment: no apparent nausea or vomiting Anesthetic complications: no   No complications documented.  Last Vitals:  Vitals:   08/02/20 0848 08/02/20 0921  BP: (!) 97/56 117/73  Pulse: 79 88  Resp: (!) 23 19  Temp:  (!) 36.4 C  SpO2: 99% 97%    Last Pain:  Vitals:   08/02/20 0921  TempSrc: Oral  PainSc:                  Duey Liller L Ruger Saxer

## 2020-08-03 ENCOUNTER — Inpatient Hospital Stay: Payer: Self-pay

## 2020-08-03 DIAGNOSIS — G061 Intraspinal abscess and granuloma: Principal | ICD-10-CM

## 2020-08-03 LAB — COMPREHENSIVE METABOLIC PANEL
ALT: 624 U/L — ABNORMAL HIGH (ref 0–44)
AST: 845 U/L — ABNORMAL HIGH (ref 15–41)
Albumin: 2.6 g/dL — ABNORMAL LOW (ref 3.5–5.0)
Alkaline Phosphatase: 162 U/L — ABNORMAL HIGH (ref 38–126)
Anion gap: 9 (ref 5–15)
BUN: 11 mg/dL (ref 6–20)
CO2: 27 mmol/L (ref 22–32)
Calcium: 8.5 mg/dL — ABNORMAL LOW (ref 8.9–10.3)
Chloride: 95 mmol/L — ABNORMAL LOW (ref 98–111)
Creatinine, Ser: 0.65 mg/dL (ref 0.61–1.24)
GFR, Estimated: >60 mL/min (ref >60)
Glucose, Bld: 120 mg/dL — ABNORMAL HIGH (ref 70–99)
Potassium: 4.1 mmol/L (ref 3.5–5.1)
Sodium: 131 mmol/L — ABNORMAL LOW (ref 135–145)
Total Bilirubin: 0.6 mg/dL (ref 0.3–1.2)
Total Protein: 6.6 g/dL (ref 6.5–8.1)

## 2020-08-03 LAB — PROTIME-INR
INR: 1.1 (ref 0.8–1.2)
Prothrombin Time: 13.4 seconds (ref 11.4–15.2)

## 2020-08-03 LAB — CBC
HCT: 45.9 % (ref 39.0–52.0)
Hemoglobin: 15.5 g/dL (ref 13.0–17.0)
MCH: 29.8 pg (ref 26.0–34.0)
MCHC: 33.8 g/dL (ref 30.0–36.0)
MCV: 88.1 fL (ref 80.0–100.0)
Platelets: 322 10*3/uL (ref 150–400)
RBC: 5.21 MIL/uL (ref 4.22–5.81)
RDW: 14.6 % (ref 11.5–15.5)
WBC: 10 10*3/uL (ref 4.0–10.5)
nRBC: 0 % (ref 0.0–0.2)

## 2020-08-03 LAB — HEPATITIS C ANTIBODY: HCV Ab: NONREACTIVE

## 2020-08-03 MED ORDER — OXYCODONE HCL 5 MG PO TABS
10.0000 mg | ORAL_TABLET | ORAL | Status: DC | PRN
  Administered 2020-08-03: 10 mg via ORAL
  Filled 2020-08-03: qty 2

## 2020-08-03 MED ORDER — OXYCODONE HCL 5 MG PO TABS
15.0000 mg | ORAL_TABLET | ORAL | Status: DC | PRN
  Administered 2020-08-03 – 2020-08-04 (×5): 15 mg via ORAL
  Filled 2020-08-03 (×5): qty 3

## 2020-08-03 MED ORDER — SODIUM CHLORIDE 0.9 % IV SOLN
12.0000 g | INTRAVENOUS | Status: DC
  Administered 2020-08-03: 12 g via INTRAVENOUS
  Filled 2020-08-03 (×2): qty 12000

## 2020-08-03 NOTE — Progress Notes (Signed)
Patient refused to have drawn and refused the continuous pulse ox stating that it has been beeping all night which is false I have been in and out of his room.

## 2020-08-03 NOTE — Progress Notes (Signed)
Subjective:    Hospital day: 4  Overnight event: No acute event  Patient is seen at bedside.  He was sitting up and eating breakfast.  He reports improvement of his symptoms after the aspiration procedure but still complains of back pain.  He endorses tingling sensation of bilateral thighs but denies any acute weakness or neurological deficit of bilateral lower extremities.  Objective:  Vital signs in last 24 hours: Vitals:   08/02/20 2009 08/02/20 2100 08/03/20 0102 08/03/20 0400  BP:  97/61    Pulse:  97 98   Resp: 18 16 16 18   Temp:  99.6 F (37.6 C) 100 F (37.8 C)   TempSrc:  Oral Oral   SpO2: 97% 100% 95% 95%  Weight:      Height:       CBC Latest Ref Rng & Units 08/03/2020 08/02/2020 08/01/2020  WBC 4.0 - 10.5 K/uL 10.0 10.0 10.9(H)  Hemoglobin 13.0 - 17.0 g/dL 08/03/2020 66.0 63.0  Hematocrit 39.0 - 52.0 % 45.9 48.2 44.0  Platelets 150 - 400 K/uL 322 338 411(H)   CMP Latest Ref Rng & Units 08/03/2020 08/02/2020 08/01/2020  Glucose 70 - 99 mg/dL 08/03/2020) 74 109(N)  BUN 6 - 20 mg/dL 11 10 12   Creatinine 0.61 - 1.24 mg/dL 235(T 7.32  Sodium 135 - 145 mmol/L 131(L) 132(L) 131(L)  Potassium 3.5 - 5.1 mmol/L 4.1 3.9 4.0  Chloride 98 - 111 mmol/L 95(L) 91(L) 93(L)  CO2 22 - 32 mmol/L 27 25 28   Calcium 8.9 - 10.3 mg/dL 2.02) 5.42) )  Total Protein 6.5 - 8.1 g/dL 6.6 6.8 6.5  Total Bilirubin 0.3 - 1.2 mg/dL 0.6 0.5 0.4  Alkaline Phos 38 - 126 U/L 162(H) 135(H) 94  AST 15 - 41 U/L 845(H) 686(H) 230(H)  ALT 0 - 44 U/L 624(H) 430(H) 176(H)     Physical Exam  Physical Exam Constitutional:      General: He is not in acute distress. HENT:     Head: Normocephalic.  Eyes:     General:        Right eye: No discharge.        Left eye: No discharge.  Cardiovascular:     Rate and Rhythm: Normal rate and regular rhythm.     Heart sounds: No murmur heard.   Pulmonary:     Effort: Pulmonary effort is normal. No respiratory distress.     Breath sounds: Normal breath  sounds.  Abdominal:     General: Bowel sounds are normal.  Musculoskeletal:     Comments: A fluctuant mass palpated in the lower lumbar, above the aspiration site.  Tender to touch.  Skin:    General: Skin is warm.  Neurological:     Mental Status: He is alert.     Assessment/Plan: Kessinger is a 40 year old with IV drug use history who was admitted for back pain, found to have a lumbar paraspinal myositis with abscess and MSSA bacteremia,currently treated with IV Nafcillin.Status post abscess aspiration x 2 with IR. ID is onboard with the treatment team.   Principal Problem:   MSSA bacteremia Active Problems:   Paraspinal abscess (HCC)   Heroin withdrawal (HCC)  MSSA Bacteremia  Lumbar Paraspinal Myositis with Abscess  Osteomyelitis L4 -T-max 100 in the last 24 hours.  Cefazolin was changed to nafcillin per ID given the possibility of drug-induced liver injury. -Another palpable fluctuant mass at the lower lumbar noted on exam today.  No indication for ultrasound-guided aspiration per  IR.  If symptomatic, will require I&D for further decompression. -Continue to monitor for focal neurological deficit given the early abscess formation at L4-L5 level without compression of thecal sac.  -TEE is negative for vegetation.  However there is a small thin mobile filamentous structure that likely represents a redundant chordae tendinea.  Unsure if this is a vegetation.  Presumed endocarditis treatment with 6 weeks of antibiotic. -Appreciate ID recommendations -Repeat blood cultures, negative to date -NafcillinIV.  Anticipated 6 weeks of treatment.  Goals to have at least 2 weeks of IV antibiotic per ID. - Xarelto 10mg  daily  -Continue Dilaudid PCA pump for pain control.  Will slowly transition off when symptoms are better controlled - Ondansetron 4mg  q6hrs PRN   Elevated Liver Enzymes  AST and ALT continue to trend up.  HIV and hepatitis(hep B and hep C antibody)labs  negative in 2/8.  Right upper quadrant ultrasound negative for any obstruction.  Suspect current transaminitis is related to an acute viral hepatitis in setting of ongoing infection.  ID ordered HCV RNA quant, hep C antibody.  Cefazolin was changed to nafcillin given the possibility of drug-induced liver injury. -Pending HCV RNA quant, hep C antibody. -Stop cefazolin, start nafcillin -CMP daily   Polysubstance Abuse  Spoke with patient about possible lowering the dose of his Dilaudid PCA. Pateint is hesitant due to ongoing pain. Continue Dilaudid PCA pump at this time given ongoing infection and abscess. Will transition to as needed Dilaudid when symptoms improve. Plan to start methadone during this admission. - Dilaudid PCA pump w/ Naloxone PRN - Narcan available as needed - Ondansetron 4mg  q6hrs PRN - Diphenhydramine 12.5mg  IV q6hrs PRN  - Nicotine 14mg  patch daily - Hydroxyzine 25mg  tid prn   Code status: Full Code  Diet: Regular  DVT PPx: On Xarelto 10mg  daily  Prior to Admission Living Arrangement:Home Anticipated Discharge Location:to be determined Barriers to Discharge:medical treatment  4/8, DO 08/03/2020, 6:22 AM Pager: [phone] After 5pm on weekdays and 1pm on weekends: On Call pager [phone]

## 2020-08-03 NOTE — Plan of Care (Signed)

## 2020-08-03 NOTE — Progress Notes (Signed)
[facility]    Date of Admission:  07/29/2020   Total days of antibiotics 6           ID: George Summers is a 40 y.o. 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 Still has back pain but able to move somewhat. He describes low back pain but now  Tolerable. Having bowel movements. Has low abdominal discomfort but no RUQ pain no dark urine, jaundice; he is eating OTC vitamin gummies 1-2 per day  Had RUQ U/S no significant findings  On labs AST/ALT continue to trend up  Medications:  . HYDROmorphone   Intravenous Q4H  . nicotine  14 mg Transdermal Daily  . ramelteon  8 mg Oral QHS    Objective: Vital signs in last 24 hours: Temp:  [98.4 F (36.9 C)-100 F (37.8 C)] 98.4 F (36.9 C) (02/15 0814) Pulse Rate:  [80-98] 80 (02/15 0814) Resp:  [14-19] 16 (02/15 0839) BP: (97-118)/(61-76) 106/73 (02/15 0814) SpO2:  [92 %-100 %] 92 % (02/15 0839) FiO2 (%):  [0 %] 0 % (02/15 0400) Physical Exam  Constitutional: He is oriented to person, place, and time. He appears well-developed and well-nourished. No distress.  HENT:  Mouth/Throat: Oropharynx is clear and moist. No oropharyngeal exudate.  Cardiovascular: Normal rate, regular rhythm and normal heart sounds. Exam reveals no gallop and no friction rub.  No murmur heard.  Pulmonary/Chest: Effort normal and breath sounds normal. No respiratory distress. He has no wheezes.  Abdominal: Soft. Bowel sounds are normal. He exhibits no distension. There is no tenderness.  Lymphadenopathy:  He has no cervical adenopathy.  Neurological: He is alert and oriented to person, place, and time.  Skin: Skin is warm and dry. No rash noted. No erythema.  Psychiatric: He has a normal mood and affect. His  behavior is normal.     Lab Results Recent Labs    08/02/20 0358 08/03/20 0431  WBC 10.0 10.0  HGB 15.7 15.5  HCT 48.2 45.9  NA 132* 131*  K 3.9 4.1  CL 91* 95*  CO2 25 27  BUN 10 11  CREATININE 0.83 0.65   Liver Panel Recent Labs    08/02/20 0358 08/03/20 0431  PROT 6.8 6.6  ALBUMIN 2.7* 2.6*  AST 686* 845*  ALT 430* 624*  ALKPHOS 135* 162*  BILITOT 0.5 0.6   Sedimentation Rate No results for input(s): ESRSEDRATE in the last 72 hours. C-Reactive Protein No results for input(s): CRP in the last 72 hours.  Microbiology: 2/11 paraspinal abscess MSSA 2/8 blood cx MSSA Studies/Results: [place] IMAGE GUIDED DRAINAGE BY PERCUTANEOUS CATHETER  Result Date: 08/02/2020 INDICATION: 40 year old male with a history paraspinal abscess EXAM: IMAGE GUIDED ABSCESS ASPIRATION MEDICATIONS: The patient is currently admitted to the hospital and receiving intravenous antibiotics. The antibiotics were administered within an appropriate time frame prior to the initiation of the procedure. ANESTHESIA/SEDATION: Fentanyl 50 mcg IV; Versed 1.0 mg IV Moderate Sedation Time:  12 MINUTES The patient was continuously monitored during the procedure by the interventional radiology nurse under my direct supervision. COMPLICATIONS: NONE PROCEDURE: Informed written consent was obtained from the patient after a thorough discussion of the procedural risks, benefits and alternatives. All questions were addressed. Maximal Sterile Barrier Technique was utilized including caps, mask,  sterile gowns, sterile gloves, sterile drape, hand hygiene and skin antiseptic. A timeout was performed prior to the initiation of the procedure. Patient position prone position on the ultrasound stretcher. Scout images were acquired. The patient is prepped and draped in the usual sterile fashion. 1% lidocaine was used for local anesthesia. Using ultrasound guidance, a 10 French drain was advanced with trocar technique into the most  liquified portion of the abscess in the right paraspinal musculature. Aspiration of approximately 20 cc of purulent fluid was performed. Catheter was removed and a bandage was placed. 1% lidocaine was used for local anesthesia overlying the left-sided component. Yueh needle was advanced under ultrasound guidance with aspiration of approximately 10 cc. Sterile bandage was placed after removal of the Yueh catheter. Patient tolerated the procedure with no complications. No significant blood loss IMPRESSION: Status post ultrasound-guided aspiration of paraspinal abscess in the lumbar region with aspiration approximately 30 cc of purulent material. Previous aspiration yielded adequate culture so no additional sample was sent. Signed, Yvone Neu. Reyne Dumas, RPVI [facility] Electronically Signed   By: Gilmer Mor D.O.   On: 08/02/2020 16:06   US Abdomen Limited RUQ (LIVER/GB)  Result Date: 08/02/2020 CLINICAL DATA:  Transaminitis EXAM: ULTRASOUND ABDOMEN LIMITED RIGHT UPPER QUADRANT COMPARISON:  None. FINDINGS: Gallbladder: No gallstones or wall thickening visualized. No sonographic Murphy sign noted by sonographer. Common bile duct: Diameter: 4 mm Liver: No focal lesion identified. Within normal limits in parenchymal echogenicity. Portal vein is patent on color Doppler imaging with normal direction of blood flow towards the liver. Other: None. IMPRESSION: Unremarkable right upper quadrant ultrasound. Electronically Signed   By: Maudry Mayhew MD   On: 08/02/2020 16:03     Assessment/Plan: MSSA lumbar osteo with paraspinal abscess and bacteremia, with presumed endocarditis = plan for 6 wk of treatment. 2 weeks with IV therapy. Plan to switch to nafcillin, see below.  transaminitis = will check hep c and hep c VL, though on admit was negative. There are rare reports of cefazolin causing drug induced liver injury. We will change to nafcillin. In the  meantime, will check PT/INR for synthetic dysfunction. Continue to monitor LFTs  High opiate dependence = currently on hydromorphone pca, at some time at end of the week or weekend, maybe worth transitioning to methadone and seeing how to establish in methadone clinic. Current conversion would be close to 100mg  of methadone    [facility] Cell: [phone] Pager: [phone]  08/03/2020, 11:11 AM

## 2020-08-03 NOTE — Progress Notes (Signed)
Arrived to discuss PICC placement including risks, benefits, and alternatives. Patient stated he does not want a PICC after hearing risks and tip placement of the PICC. Primary RN notified.

## 2020-08-04 ENCOUNTER — Encounter (HOSPITAL_COMMUNITY): Payer: Self-pay | Admitting: Cardiology

## 2020-08-04 LAB — COMPREHENSIVE METABOLIC PANEL
ALT: 622 U/L — ABNORMAL HIGH (ref 0–44)
AST: 628 U/L — ABNORMAL HIGH (ref 15–41)
Albumin: 2.9 g/dL — ABNORMAL LOW (ref 3.5–5.0)
Alkaline Phosphatase: 181 U/L — ABNORMAL HIGH (ref 38–126)
Anion gap: 10 (ref 5–15)
BUN: 9 mg/dL (ref 6–20)
CO2: 28 mmol/L (ref 22–32)
Calcium: 8.9 mg/dL (ref 8.9–10.3)
Chloride: 98 mmol/L (ref 98–111)
Creatinine, Ser: 0.62 mg/dL (ref 0.61–1.24)
GFR, Estimated: >60 mL/min (ref >60)
Glucose, Bld: 109 mg/dL — ABNORMAL HIGH (ref 70–99)
Potassium: 4 mmol/L (ref 3.5–5.1)
Sodium: 136 mmol/L (ref 135–145)
Total Bilirubin: 0.6 mg/dL (ref 0.3–1.2)
Total Protein: 6.7 g/dL (ref 6.5–8.1)

## 2020-08-04 LAB — AEROBIC/ANAEROBIC CULTURE W GRAM STAIN (SURGICAL/DEEP WOUND)

## 2020-08-04 LAB — CBC
HCT: 44.2 % (ref 39.0–52.0)
Hemoglobin: 15.4 g/dL (ref 13.0–17.0)
MCH: 30.3 pg (ref 26.0–34.0)
MCHC: 34.8 g/dL (ref 30.0–36.0)
MCV: 87 fL (ref 80.0–100.0)
Platelets: 382 10*3/uL (ref 150–400)
RBC: 5.08 MIL/uL (ref 4.22–5.81)
RDW: 15 % (ref 11.5–15.5)
WBC: 10.8 10*3/uL — ABNORMAL HIGH (ref 4.0–10.5)
nRBC: 0 % (ref 0.0–0.2)

## 2020-08-04 LAB — CULTURE, BLOOD (ROUTINE X 2)
Culture: NO GROWTH
Culture: NO GROWTH
Special Requests: ADEQUATE

## 2020-08-04 LAB — CK: Total CK: 24 U/L — ABNORMAL LOW (ref 49–397)

## 2020-08-04 LAB — HCV RNA (INTERNATIONAL UNITS)
HCV RNA (International Units): 43800000 IU/mL
HCV log10: 7.641 log10 IU/mL

## 2020-08-04 LAB — HCV RNA QUANT

## 2020-08-04 MED ORDER — OXYCODONE HCL 5 MG PO TABS
20.0000 mg | ORAL_TABLET | ORAL | Status: DC | PRN
  Administered 2020-08-04 – 2020-08-06 (×12): 20 mg via ORAL
  Filled 2020-08-04 (×12): qty 4

## 2020-08-04 MED ORDER — SODIUM CHLORIDE 0.9 % IV SOLN
12.0000 g | INTRAVENOUS | Status: DC
  Administered 2020-08-04: 12 g via INTRAVENOUS
  Filled 2020-08-04 (×2): qty 12000

## 2020-08-04 NOTE — Progress Notes (Signed)
Patient stated that he will discuss with the doctor that he does not want a PICC line today when they round.

## 2020-08-04 NOTE — Progress Notes (Signed)
HD#5 Subjective:  Overnight Events: No acute event   Patient evaluated at bedside. States infection feels better today. Feels current dose of oxycodone 15 mg helps take the edge off.  Patient is interested in slow transition to methadone and will follow up with methadone clinic after discharge.  Denies new weakness or change in sensation of bilateral lower extremity.  Reports that his legs are stronger.  Discussed PICC line placement with him. States he is concerned about blood clots and other risks of PICC line and feels burning from IV antibiotics is tolerable. Also feels that burning sensation radaitites to chest and does not feel a central line would prevent this issue. Discussed IV vs oral medications with ID, and interested in potentially transitioning to oral mediations.   Objective:  Vital signs in last 24 hours: Vitals:   08/03/20 1215 08/03/20 1522 08/03/20 1922 08/04/20 0601  BP: 98/71 106/74 115/74 92/63  Pulse: 98 87 84 88  Resp: 19 18 16 18   Temp: 98.4 F (36.9 C) 98.2 F (36.8 C) 98.2 F (36.8 C) 98.9 F (37.2 C)  TempSrc: Oral Oral Oral Oral  SpO2: 98% 98% 100% 99%  Weight:      Height:       Supplemental O2: Room Air SpO2: 99 % O2 Flow Rate (L/min): 0 L/min FiO2 (%): (!) 0 %   Physical Exam:  Physical Exam Constitutional:      General: He is not in acute distress. HENT:     Head: Normocephalic.  Eyes:     General:        Right eye: No discharge.        Left eye: No discharge.  Cardiovascular:     Rate and Rhythm: Normal rate and regular rhythm.  Pulmonary:     Effort: Pulmonary effort is normal. No respiratory distress.  Abdominal:     General: Bowel sounds are normal.     Tenderness: There is no abdominal tenderness.  Musculoskeletal:     Right lower leg: No edema.     Left lower leg: No edema.     Comments: Fluctuant mass reduced in size of lower back  Skin:    General: Skin is warm.  Neurological:     Mental Status: He is alert.   Psychiatric:        Mood and Affect: Mood normal.     Filed Weights   07/29/20 2041 07/30/20 1115  Weight: 72 kg 72 kg     Intake/Output Summary (Last 24 hours) at 08/04/2020 0707 Last data filed at 08/03/2020 2300 Gross per 24 hour  Intake 11.2 ml  Output 300 ml  Net -288.8 ml   Net IO Since Admission: 290.64 mL [08/04/20 0707]  Pertinent Labs: CBC Latest Ref Rng & Units 08/03/2020 08/02/2020 08/01/2020  WBC 4.0 - 10.5 K/uL 10.0 10.0 10.9(H)  Hemoglobin 13.0 - 17.0 g/dL 08/03/2020 43.3 29.5  Hematocrit 39.0 - 52.0 % 45.9 48.2 44.0  Platelets 150 - 400 K/uL 322 338 411(H)    CMP Latest Ref Rng & Units 08/03/2020 08/02/2020 08/01/2020  Glucose 70 - 99 mg/dL 08/03/2020) 74 416(S)  BUN 6 - 20 mg/dL 11 10 12   Creatinine 0.61 - 1.24 mg/dL 063(K 1.60  Sodium 135 - 145 mmol/L 131(L) 132(L) 131(L)  Potassium 3.5 - 5.1 mmol/L 4.1 3.9 4.0  Chloride 98 - 111 mmol/L 95(L) 91(L) 93(L)  CO2 22 - 32 mmol/L 27 25 28   Calcium 8.9 - 10.3 mg/dL 1.09) 3.23) )  Total Protein 6.5 - 8.1 g/dL 6.6 6.8 6.5  Total Bilirubin 0.3 - 1.2 mg/dL 0.6 0.5 0.4  Alkaline Phos 38 - 126 U/L 162(H) 135(H) 94  AST 15 - 41 U/L 845(H) 686(H) 230(H)  ALT 0 - 44 U/L 624(H) 430(H) 176(H)    Imaging: [place] EKG SITE RITE  Result Date: 08/03/2020 If Site Rite image not attached, placement could not be confirmed due to current cardiac rhythm.   Assessment/Plan:   Principal Problem:   MSSA bacteremia Active Problems:   Paraspinal abscess (HCC)   Heroin withdrawal (HCC)   Patient Summary: George Summers is a 40 year old with IV drug use history who was admitted for back pain, found to have a lumbar paraspinal myositis with abscess and MSSA bacteremia,currently treated with IV Nafcillin.Status post abscess aspiration x 2 with IR. IDis onboard with the treatment team.   MSSA Bacteremia  Lumbar Paraspinal Myositis with Abscess  Osteomyelitis L4 -Patient was started on nafcillin yesterday given the concern  of drug-induced liver injury.  Patient however reports burning sensation with nafcillin administration, concerning for possible IV infiltration.  PICC line was ordered but patient declined due to the risks.  Per ID, will reinitiate nafcillin and monitor for symptoms.  If worsening burning sensation, will possibly try daptomycin vs oritavancin.  -Continue to monitor for focal neurological deficit given the early abscess formation at L4-L5 level without compression of thecal sac.  -Appreciate ID recommendations -Repeat blood cultures,negative to date - NafcillinIV.  Anticipated 6 weeks of treatment.Goals to have at least 2 weeks of IV antibiotic per ID.   Elevated Liver Enzymes Drug-induced liver injury versus acute viral hepatitis.  Hep C antibody nonreactive and pending RNA quant.  AST/ALT trending down today after holding cefazolin. Will follow up on HCV viral load. Continue to monitor CMP daily. Added CK to rule out rabdo. -Pending HCV RNA quant -Pending CK -CMP daily   Polysubstance Abuse  Currently on Oxycodone 20 mg Q4h. Plan for transition to Methadone during this admision. Patient reports having follow up with [employer]. Will contact them for guidelines regarding starting Methadone -Oxycodone 20 mg Q4h    Code status: Full Code  Diet: Regular  DVT PPx: On Xarelto 10mg  daily  Prior to Admission Living Arrangement:Home Anticipated Discharge Location:to be determined Barriers to Discharge:medical treatment   [facility]:ENID, DO 08/04/2020, 7:07 AM Pager: [phone]  Please contact the on call pager after 5 pm and on weekends at [phone].

## 2020-08-04 NOTE — Progress Notes (Signed)
Pt not hooked up to IV abx yet. He was supposed to get into shower 2 hours ago, but still has not took shower, so I am waiting until he finishes to hook him up. Pt also complaining about his pain meds, and not getting them brought to him every 4 hours. I informed him that they are PRN meds, not scheduled, and he needs to call for them when he needs them.  

## 2020-08-04 NOTE — Plan of Care (Signed)
  Problem: Education: Goal: Knowledge of General Education information will improve Description: Including pain rating scale, medication(s)/side effects and non-pharmacologic comfort measures Outcome: Progressing   Problem: Health Behavior/Discharge Planning: Goal: Ability to manage health-related needs will improve Outcome: Progressing   Problem: Activity: Goal: Risk for activity intolerance will decrease Outcome: Progressing   Problem: Nutrition: Goal: Adequate nutrition will be maintained Outcome: Progressing   Problem: Coping: Goal: Level of anxiety will decrease Outcome: Progressing   Problem: Elimination: Goal: Will not experience complications related to bowel motility Outcome: Progressing Goal: Will not experience complications related to urinary retention Outcome: Progressing   Problem: Pain Managment: Goal: General experience of comfort will improve Outcome: Progressing   Problem: Safety: Goal: Ability to remain free from injury will improve Outcome: Progressing   

## 2020-08-04 NOTE — Progress Notes (Signed)
[facility]    Date of Admission:  07/29/2020   Total days of antibiotics 7           ID: George Summers is a 40 y.o. male with   Principal Problem:   MSSA bacteremia Active Problems:   Paraspinal abscess (HCC)   Heroin withdrawal (HCC)    Subjective: Patient did not tolerate nafcillin infusion felt that he noticed burning in right arm PIV tracking up arm and to his heart. abtx temporarily stopped but patient also declines getting picc line. He is interested in oral abtx. He is concerned about side effects of picc line, has also stopped using dilaudid pca  Smoke smell noted in his room  Labs LFT appear that they have plateaud, this am labs in 600s  Medications:  . nicotine  14 mg Transdermal Daily  . ramelteon  8 mg Oral QHS    Objective: Vital signs in last 24 hours: Temp:  [98.2 F (36.8 C)-98.9 F (37.2 C)] 98.5 F (36.9 C) (02/16 0844) Pulse Rate:  [84-98] 86 (02/16 0844) Resp:  [16-19] 19 (02/16 0844) BP: (92-115)/(63-74) 112/74 (02/16 0844) SpO2:  [93 %-100 %] 98 % (02/16 0844) Physical Exam  Constitutional: He is oriented to person, place, and time. He appears well-developed and well-nourished. No distress. perspiring HENT:  Mouth/Throat: Oropharynx is clear and moist. No oropharyngeal exudate.  Cardiovascular: Normal rate, regular rhythm and normal heart sounds. Exam reveals no gallop and no friction rub.  No murmur heard.  Pulmonary/Chest: Effort normal and breath sounds normal. No respiratory distress. He has no wheezes.  Abdominal: Soft. Bowel sounds are normal. He exhibits no distension. There is no tenderness.  Lymphadenopathy:  He has no cervical adenopathy.  Neurological: He is alert and oriented to person, place, and time.  Skin: Skin is warm and dry. No rash noted. No erythema.  Psychiatric: He has a normal mood and affect. His behavior is normal.    Lab Results Recent Labs    08/03/20 0431 08/04/20 0703  WBC  10.0 10.8*  HGB 15.5 15.4  HCT 45.9 44.2  NA 131* 136  K 4.1 4.0  CL 95* 98  CO2 27 28  BUN 11 9  CREATININE 0.65 0.62   Liver Panel Recent Labs    08/03/20 0431 08/04/20 0703  PROT 6.6 6.7  ALBUMIN 2.6* 2.9*  AST 845* 628*  ALT 624* 622*  ALKPHOS 162* 181*  BILITOT 0.6 0.6    Microbiology: Staphylococcus aureus      MIC    CIPROFLOXACIN <=0.5 SENSI... Sensitive    CLINDAMYCIN <=0.25 SENS... Sensitive    ERYTHROMYCIN >=8 RESISTANT  Resistant    GENTAMICIN <=0.5 SENSI... Sensitive    Inducible Clindamycin NEGATIVE  Sensitive    OXACILLIN 0.5 SENSITIVE  Sensitive    RIFAMPIN <=0.5 SENSI... Sensitive    TETRACYCLINE <=1 SENSITIVE  Sensitive    TRIMETH/SULFA <=10 SENSIT... Sensitive    VANCOMYCIN <=0.5 SENSI... Sensitive     Studies/Results: [place] IMAGE GUIDED DRAINAGE BY PERCUTANEOUS CATHETER  Result Date: 08/02/2020 INDICATION: 40 year old male with a history paraspinal abscess EXAM: IMAGE GUIDED ABSCESS ASPIRATION MEDICATIONS: The patient is currently admitted to the hospital and receiving intravenous antibiotics. The antibiotics were administered within an appropriate time frame prior to the initiation of the procedure. ANESTHESIA/SEDATION: Fentanyl 50 mcg IV; Versed 1.0 mg IV Moderate Sedation Time:  12 MINUTES The patient was continuously monitored during the procedure by the interventional radiology nurse under my direct  supervision. COMPLICATIONS: NONE PROCEDURE: Informed written consent was obtained from the patient after a thorough discussion of the procedural risks, benefits and alternatives. All questions were addressed. Maximal Sterile Barrier Technique was utilized including caps, mask, sterile gowns, sterile gloves, sterile drape, hand hygiene and skin antiseptic. A timeout was performed prior to the initiation of the procedure. Patient position prone position on the ultrasound stretcher. Scout images were acquired. The patient is prepped and draped in the usual  sterile fashion. 1% lidocaine was used for local anesthesia. Using ultrasound guidance, a 10 French drain was advanced with trocar technique into the most liquified portion of the abscess in the right paraspinal musculature. Aspiration of approximately 20 cc of purulent fluid was performed. Catheter was removed and a bandage was placed. 1% lidocaine was used for local anesthesia overlying the left-sided component. Yueh needle was advanced under ultrasound guidance with aspiration of approximately 10 cc. Sterile bandage was placed after removal of the Yueh catheter. Patient tolerated the procedure with no complications. No significant blood loss IMPRESSION: Status post ultrasound-guided aspiration of paraspinal abscess in the lumbar region with aspiration approximately 30 cc of purulent material. Previous aspiration yielded adequate culture so no additional sample was sent. Signed, Yvone Neu. Reyne Dumas, RPVI [facility] Radiology Electronically Signed   By: Gilmer Mor D.O.   On: 08/02/2020 16:06   [place] EKG SITE RITE  Result Date: 08/03/2020 If Site Rite image not attached, placement could not be confirmed due to current cardiac rhythm.  US Abdomen Limited RUQ (LIVER/GB)  Result Date: 08/02/2020 CLINICAL DATA:  Transaminitis EXAM: ULTRASOUND ABDOMEN LIMITED RIGHT UPPER QUADRANT COMPARISON:  None. FINDINGS: Gallbladder: No gallstones or wall thickening visualized. No sonographic Murphy sign noted by sonographer. Common bile duct: Diameter: 4 mm Liver: No focal lesion identified. Within normal limits in parenchymal echogenicity. Portal vein is patent on color Doppler imaging with normal direction of blood flow towards the liver. Other: None. IMPRESSION: Unremarkable right upper quadrant ultrasound. Electronically Signed   By: Maudry Mayhew MD   On: 08/02/2020 16:03     Assessment/Plan: transaminitis = unclear etiology. HCV ruled out. Cefazolin stopped  yesterday concerning for DILI. Currently on nafcillin but having thrombophlebitis? Possibly  Thrombophlebitis = will reinitiate nafcillin. If still having symptoms unclear if it is related to drug vs the PIV. Will try to change to daptomycin.  MSSA lumbar osteomyelitis/paraspinal abscess/presumed endocarditis = will reinitiate nafcillin to see if still having same symptoms. If so, then we will switch to dapto 8mg /kg/day. If pateint decides to wanting to switch to an oral regimen. Would recommend to discuss using 1 dose of oritavancin then providing oral abtx.  [facility] Cell: [phone] Pager: [phone]  08/04/2020, 11:38 AM

## 2020-08-04 NOTE — Progress Notes (Signed)
RN came to room to explain to Pt that his pain med is ordered as PRN, not scheduled and he would need to call for it when he needs it. Pt states understanding.

## 2020-08-04 NOTE — Progress Notes (Signed)
Multiple staff members suspect patient may be smoking in the room - unwitnessed.  Patient is reminded that there is no smoking in the hospital.  He stated he only has his vapor.

## 2020-08-04 NOTE — Progress Notes (Signed)
Nafcillin continuous infusion was stopped at 2209 as per patient's request.  He stated "I took it for as long as I could stand it.  It is too painful.  The doctor said it can be stopped and I can try again later."

## 2020-08-05 MED ORDER — SODIUM CHLORIDE 0.9 % IV SOLN
580.0000 mg | Freq: Every day | INTRAVENOUS | Status: DC
  Filled 2020-08-05 (×2): qty 11.6

## 2020-08-05 MED ORDER — ORITAVANCIN DIPHOSPHATE 400 MG IV SOLR
1200.0000 mg | Freq: Once | INTRAVENOUS | Status: AC
  Administered 2020-08-06: 1200 mg via INTRAVENOUS
  Filled 2020-08-05: qty 120

## 2020-08-05 MED ORDER — SODIUM CHLORIDE 0.9 % IV SOLN
8.0000 mg/kg | Freq: Every day | INTRAVENOUS | Status: DC
  Filled 2020-08-05: qty 11.52

## 2020-08-05 MED ORDER — CEFAZOLIN SODIUM-DEXTROSE 2-4 GM/100ML-% IV SOLN
2.0000 g | Freq: Three times a day (TID) | INTRAVENOUS | Status: DC
  Administered 2020-08-05 – 2020-08-06 (×4): 2 g via INTRAVENOUS
  Filled 2020-08-05 (×7): qty 100

## 2020-08-05 NOTE — Plan of Care (Signed)
  Problem: Education: Goal: Knowledge of General Education information will improve Description: Including pain rating scale, medication(s)/side effects and non-pharmacologic comfort measures Outcome: Progressing   Problem: Health Behavior/Discharge Planning: Goal: Ability to manage health-related needs will improve Outcome: Progressing   Problem: Clinical Measurements: Goal: Ability to maintain clinical measurements within normal limits will improve Outcome: Progressing   Problem: Activity: Goal: Risk for activity intolerance will decrease Outcome: Progressing   Problem: Nutrition: Goal: Adequate nutrition will be maintained Outcome: Progressing   Problem: Coping: Goal: Level of anxiety will decrease Outcome: Progressing   Problem: Elimination: Goal: Will not experience complications related to urinary retention Outcome: Progressing   Problem: Pain Managment: Goal: General experience of comfort will improve Outcome: Progressing   Problem: Safety: Goal: Ability to remain free from injury will improve Outcome: Progressing   

## 2020-08-05 NOTE — Progress Notes (Signed)
Phlebotomy came around 0200 to do am labs. Patient refused.  Patient is aware that this will add to any delay in care for him.

## 2020-08-05 NOTE — Progress Notes (Addendum)
HD#6 Subjective:  Overnight Events: No acute event  This morning sitting in chair watching a movie. Feels that pain is tolerable this morning. Notes that PIV lines repeally occluded overnight so he asked nurse take it off last night so he can sleep. Has some burning and swelling around IV site this morning.   Discussed that viral tests now positive for hepatitis C. Know a few people a couple years who has had hepatitis C. States he know some natural ways to cure it and will discuss treatment options with the ID doctors today.   States that his legs feel well, denies new weakness or change in sensation.   States plants to set up follow up at [facility] for methadone. Phone number [phone]  Objective:  Vital signs in last 24 hours: Vitals:   08/04/20 0601 08/04/20 0844 08/04/20 2023 08/05/20 0346  BP: 92/63 112/74 107/75 101/68  Pulse: 88 86 96 (!) 102  Resp: 18 19 16 14   Temp: 98.9 F (37.2 C) 98.5 F (36.9 C) 98.4 F (36.9 C) 98 F (36.7 C)  TempSrc: Oral Oral Oral Oral  SpO2: 99% 98% 96% 98%  Weight:      Height:       Supplemental O2: Room Air SpO2: 98 % O2 Flow Rate (L/min): 0 L/min FiO2 (%): (!) 0 %   Physical Exam:  Physical Exam Constitutional:      General: He is not in acute distress. HENT:     Head: Normocephalic.  Eyes:     General:        Right eye: No discharge.        Left eye: No discharge.  Cardiovascular:     Rate and Rhythm: Normal rate and regular rhythm.  Pulmonary:     Effort: Pulmonary effort is normal. No respiratory distress.  Abdominal:     General: Bowel sounds are normal.  Musculoskeletal:     Right lower leg: No edema.     Left lower leg: No edema.     Comments: Improved swelling of the mass on his lower back   Skin:    General: Skin is warm.  Neurological:     Mental Status: He is alert.  Psychiatric:        Mood and Affect: Mood normal.     Filed Weights   07/29/20 2041 07/30/20 1115  Weight: 72 kg 72 kg      Intake/Output Summary (Last 24 hours) at 08/05/2020 0619 Last data filed at 08/04/2020 1817 Gross per 24 hour  Intake 727.4 ml  Output --  Net 727.4 ml   Net IO Since Admission: 618.04 mL [08/05/20 0619]  Pertinent Labs: CBC Latest Ref Rng & Units 08/04/2020 08/03/2020 08/02/2020  WBC 4.0 - 10.5 K/uL 10.8(H) 10.0 10.0  Hemoglobin 13.0 - 17.0 g/dL 08/04/2020 40.0 86.7  Hematocrit 39.0 - 52.0 % 44.2 45.9 48.2  Platelets 150 - 400 K/uL 382 322 338    CMP Latest Ref Rng & Units 08/04/2020 08/03/2020 08/02/2020  Glucose 70 - 99 mg/dL 08/04/2020) 509(T) 74  BUN 6 - 20 mg/dL 9 11 10   Creatinine 0.61 - 1.24 mg/dL 267(T 2.45  Sodium 135 - 145 mmol/L 136 131(L) 132(L)  Potassium 3.5 - 5.1 mmol/L 4.0 4.1 3.9  Chloride 98 - 111 mmol/L 98 95(L) 91(L)  CO2 22 - 32 mmol/L 28 27 25   Calcium 8.9 - 10.3 mg/dL 8.9 8.09) 9.83)  Total Protein 6.5 - 8.1 g/dL 6.7 6.6 6.8  Total Bilirubin 0.3 - 1.2 mg/dL 0.6 0.6 0.5  Alkaline Phos 38 - 126 U/L 181(H) 162(H) 135(H)  AST 15 - 41 U/L 628(H) 845(H) 686(H)  ALT 0 - 44 U/L 622(H) 624(H) 430(H)    Imaging: No results found.  Assessment/Plan:   Principal Problem:   MSSA bacteremia Active Problems:   Paraspinal abscess (HCC)   Heroin withdrawal (HCC)   Patient Summary: George Summers is a 40 year old with IV drug use history who was admitted for back pain, found to have a lumbar paraspinal myositis with abscess and MSSA bacteremia,currently treated with IVNafcillin.Status post abscess aspirationx 2with IR. IDis onboard with the treatment team.   MSSA Bacteremia  Lumbar Paraspinal Myositis with Abscess  Osteomyelitis L4 Repeat blood culture remains negative today.  Antibiotic is switched back to cefazolin because his elevated LFT is likely due to hep C infection. - Continue to monitor for focal neurological deficit given theearly abscess formation at L4-L5 level without compression of thecal sac.  -Appreciate ID recommendations -Repeat  blood cultures,negative to date - Continue cefazolin. Anticipated 6 weeks of treatment.Goals to have at least 2 weeks of IV antibiotic per ID.   Elevated Liver Enzymes Hepatitis C infection Hepatitis C viral load came back 43 million, confirm hep C infection.  Appreciate ID recommendation on treatment plan. -CMP daily   Polysubstance Abuse Currently on Oxycodone 20 mg Q4h, currently doing well on this dose. Plan for transition to Methadone during this admision.  Patient reports will follow up with [facility] Methadone clinic. Will contact them for guidelines regarding starting Methadone -Oxycodone 20 mg Q4h    Code status: Full Code  Diet: Regular  DVT PPx: On Xarelto 10mg  daily  Prior to Admission Living Arrangement:Home Anticipated Discharge Location:to be determined Barriers to Discharge:medical treatment  AOZ:HYQM, DO 08/05/2020, 6:19 AM Pager: [phone]  Please contact the on call pager after 5 pm and on weekends at [phone].

## 2020-08-05 NOTE — Progress Notes (Signed)
Consulted to start PIV.  Patient has had multiple PIV with com placation during stay. Patient previously refused PICC.  Offered to place midline which patient refused.  New PIV placed.

## 2020-08-05 NOTE — Progress Notes (Addendum)
    [facility]    Date of Admission:  07/29/2020   Total days of antibiotics 8           ID: George Summers is a 40 y.o. male with   Principal Problem:   MSSA bacteremia Active Problems:   Paraspinal abscess (HCC)   Heroin withdrawal (HCC)    Subjective: Still have discomfort at piv from nafcillin infusion last night which was discontinued. Now has new piv on left arm, tolerating iv abtx. He is concerned about hospitalization cost and wants to leave or find alternative treatment ROS: denies f/chills/n/v/diarrhea  Medications:  . nicotine  14 mg Transdermal Daily  . ramelteon  8 mg Oral QHS    Objective: Vital signs in last 24 hours: Temp:  [98 F (36.7 C)-98.4 F (36.9 C)] 98.3 F (36.8 C) (02/17 0749) Pulse Rate:  [93-102] 93 (02/17 0749) Resp:  [14-18] 18 (02/17 0749) BP: (101-107)/(67-75) 103/67 (02/17 0749) SpO2:  [96 %-98 %] 97 % (02/17 0749)  Physical Exam  Constitutional: He is oriented to person, place, and time. He appears well-developed and well-nourished. No distress.  HENT:  Mouth/Throat: Oropharynx is clear and moist. No oropharyngeal exudate.  Cardiovascular: Normal rate, regular rhythm and normal heart sounds. Exam reveals no gallop and no friction rub.  No murmur heard.  Pulmonary/Chest: Effort normal and breath sounds normal. No respiratory distress. He has no wheezes.  Abdominal: Soft. Bowel sounds are normal. He exhibits no distension. There is no tenderness.  Lymphadenopathy:  He has no cervical adenopathy.  Neurological: He is alert and oriented to person, place, and time.  Skin: Skin is warm and dry. No rash noted. No erythema.  Psychiatric: He has a normal mood and affect. His behavior is normal.      Lab Results Recent Labs    08/03/20 0431 08/04/20 0703  WBC 10.0 10.8*  HGB 15.5 15.4  HCT 45.9 44.2  NA 131* 136  K 4.1 4.0  CL 95* 98  CO2 27 28  BUN 11 9  CREATININE 0.65 0.62   Liver Panel Recent  Labs    08/03/20 0431 08/04/20 0703  PROT 6.6 6.7  ALBUMIN 2.6* 2.9*  AST 845* 628*  ALT 624* 622*  ALKPHOS 162* 181*  BILITOT 0.6 0.6   Sedimentation Rate No results for input(s): ESRSEDRATE in the last 72 hours. C-Reactive Protein No results for input(s): CRP in the last 72 hours.  Microbiology: reviewed Studies/Results: [place] EKG SITE RITE  Result Date: 08/03/2020 If Site Rite image not attached, placement could not be confirmed due to current cardiac rhythm.    Assessment/Plan: transaminitis = HCV VL at 64M, appears c/w acute hep C infection, not drug induced liver injury.   mssa disseminated infection = will restarted cefazolin 2gm IV Q 8hr. Given patient does not want to have prolonged hospitalization for iv abtx, will give iv oritavancin tomorrow morning followed by 4-5 wk course of oral antibitoics  Opiate dependence = defer to primary team, now on oral opiates. dispo plan will also be needed since high risk for repeat iv use  [facility] for Infectious Diseases Cell: [phone] Pager: [phone]  08/05/2020, 12:09 PM

## 2020-08-06 ENCOUNTER — Other Ambulatory Visit: Payer: Self-pay | Admitting: Student

## 2020-08-06 MED ORDER — RIVAROXABAN 10 MG PO TABS
10.0000 mg | ORAL_TABLET | Freq: Every day | ORAL | Status: DC
  Administered 2020-08-06: 10 mg via ORAL
  Filled 2020-08-06: qty 1

## 2020-08-06 MED ORDER — OXYCODONE HCL 15 MG PO TABS: 15.0000 mg | ORAL_TABLET | ORAL | 0 refills | Status: DC | PRN

## 2020-08-06 MED ORDER — OXYCODONE HCL 5 MG PO TABS: 15.0000 mg | ORAL_TABLET | ORAL | 0 refills | Status: DC | PRN

## 2020-08-06 MED ORDER — LINEZOLID 600 MG PO TABS: 600.0000 mg | ORAL_TABLET | Freq: Two times a day (BID) | ORAL | 0 refills | Status: AC

## 2020-08-06 MED ORDER — NALOXONE HCL 2 MG/2ML IJ SOSY: 1.0000 mg | PREFILLED_SYRINGE | INTRAMUSCULAR | 0 refills | Status: DC | PRN

## 2020-08-06 MED FILL — oxyCODONE HCL 15 MG TABS: 15 | 4 days supply | Qty: 20 | Fill #0

## 2020-08-06 MED FILL — LINEZOLID 600 MG TABS: 600 | 28 days supply | Qty: 56 | Fill #0

## 2020-08-06 NOTE — Progress Notes (Signed)
[facility]    Date of Admission:  07/29/2020   Total days of antibiotics 10           ID: George Summers is a 40 y.o. male with  Disseminated MSSA infection, opiate dependence, acute hepatitis c disease Principal Problem:   MSSA bacteremia Active Problems:   Paraspinal abscess (HCC)   Heroin withdrawal (HCC)    Subjective: Afebrile, still some back pain, trying to get established at methadone clinic, concerned about not having insurance and large hospital bill. He refused lab draws  denies f/chills/nightsweats/n/v/diarrhea/rash  Medications:  . nicotine  14 mg Transdermal Daily  . ramelteon  8 mg Oral QHS  . rivaroxaban  10 mg Oral Daily    Objective: Vital signs in last 24 hours: Temp:  [98.4 F (36.9 C)-98.6 F (37 C)] 98.6 F (37 C) (02/18 0750) Pulse Rate:  [89-90] 90 (02/18 0750) Resp:  [16-18] 17 (02/18 0750) BP: (106-116)/(64-67) 106/64 (02/18 0750) SpO2:  [98 %-99 %] 98 % (02/18 0750) Physical Exam  Constitutional: He is oriented to person, place, and time. He appears well-developed and well-nourished. No distress.  HENT:  Mouth/Throat: Oropharynx is clear and moist. No oropharyngeal exudate.  Cardiovascular: Normal rate, regular rhythm and normal heart sounds. Exam reveals no gallop and no friction rub.  No murmur heard.  Pulmonary/Chest: Effort normal and breath sounds normal. No respiratory distress. He has no wheezes.  Lymphadenopathy:  He has no cervical adenopathy.  Neurological: He is alert and oriented to person, place, and time.  Skin: Skin is warm and dry. No rash noted. No erythema.  Psychiatric: He has a normal mood and affect. His behavior is normal.     Lab Results Recent Labs    08/04/20 0703  WBC 10.8*  HGB 15.4  HCT 44.2  NA 136  K 4.0  CL 98  CO2 28  BUN 9  CREATININE 0.62   Liver Panel Recent Labs    08/04/20 0703  PROT 6.7  ALBUMIN 2.9*  AST 628*  ALT 622*  ALKPHOS 181*  BILITOT 0.6    Lab Results  Component Value Date   ESRSEDRATE 60 (H) 07/27/2020   Lab Results  Component Value Date   CRP 26.4 (H) 07/27/2020    Microbiology: 2/11 blood cx NGTD 2/8 blood cx MSSA 2/11 paraspinal abscess Staphylococcus aureus      MIC    CIPROFLOXACIN <=0.5 SENSI... Sensitive    CLINDAMYCIN <=0.25 SENS... Sensitive    ERYTHROMYCIN >=8 RESISTANT  Resistant    GENTAMICIN <=0.5 SENSI... Sensitive    Inducible Clindamycin NEGATIVE  Sensitive    OXACILLIN 0.5 SENSITIVE  Sensitive    RIFAMPIN <=0.5 SENSI... Sensitive    TETRACYCLINE <=1 SENSITIVE  Sensitive    TRIMETH/SULFA <=10 SENSIT... Sensitive    VANCOMYCIN <=0.5 SENSI... Sensitive     Studies/Results: No results found.   Assessment/Plan: MSSA disseminated disease(bacteremia, paraspinal abscess, lumbar osteomyelitis, presumed endocarditis) = will give oritavancin today on 2/18, plan to start linezolid 600mg  BID x 4 weeks on 2/25( next Friday) - no drug interaction. Will give abtx in hand prior to leaving hospitalization.  - to follow up in the ID clinic in 10-14 days to see if willing to have another infusion of long acting/dalba or oritavancin.  Opiate dependence = "he is going to try to stay away from fentanyl"; trying to arrange for methadone appt on Monday. Primary team to determine requirements and bridge opiates til Monday  Acute  hepatitis c = has been reported to [employer]. Patient has been warned to avoid alcohol, tylenol, and natural remedies, such as milk thistle for at least 4 weeks til resolution of transaminitis. Unclear if he becomes a chronic hep c disease--too early to tell.   [facility] for Infectious Diseases Cell: [phone] Pager: [phone]  08/06/2020, 11:39 AM

## 2020-08-06 NOTE — Plan of Care (Signed)

## 2020-08-06 NOTE — Discharge Summary (Addendum)
Name: George Summers MRN: [id number] DOB: 1980/12/07 40 y.o. PCP: Patient, No Pcp Per  Date of Admission: 07/29/2020  8:29 PM Date of Discharge: 08/06/2020 Attending Physician: Anne Shutter, MD  Discharge Diagnosis: MSSA bacteremia L4 paraspinal myositis with abscess Hepatitis C infection Polysubstance use  Discharge Medications: Allergies as of 08/06/2020   No Known Allergies     Medication List    TAKE these medications   ibuprofen 200 MG tablet Commonly known as: ADVIL Take 1,200 mg by mouth every 6 (six) hours as needed for headache or moderate pain.   linezolid 600 MG tablet Commonly known as: ZYVOX Take 1 tablet (600 mg total) by mouth 2 (two) times daily for 28 days. Start taking in 1 week on 08/13/20 Start taking on: August 13, 2020   methocarbamol 500 MG tablet Commonly known as: ROBAXIN Take 1 tablet (500 mg total) by mouth 2 (two) times daily.   naloxone 2 MG/2ML injection Commonly known as: NARCAN Place 1 mL (1 mg total) into the nose as needed. Opioid overdose.   oxyCODONE 15 MG immediate release tablet Commonly known as: ROXICODONE Take 1 tablet (15 mg total) by mouth every 4 (four) hours as needed for pain.       Disposition and follow-up:   George Summers was discharged from [facility] in Stable condition.  At the hospital follow up visit please address:  1.    MSSA bacteremia L4 paraspinal abscess Patient was originally treated with cefazolin and transition to 1 dose of oritavancin and linezolid for 4 weeks -Follow-up with infectious disease -Follow-up with the internal medicine clinic to establish PCP  Hepatitis C infection -Follow-up with infectious disease for treatment  Opioid use Patient will follow up with [facility] methadone clinic on Wednesday 08/11/2020.    2.  Labs / imaging needed at time of follow-up: CBC, CMP  3.  Pending labs/ test needing follow-up: N/A  Follow-up  Appointments:   HPI Evaluated at bedside. Sleeping in bed comfortable. States pain ins tolerable. Denies numbness or weakness of bilateral lower extremities. Plan to do 1 dose of oritavancin and switch to oral antibiotics.    Hospital Course by problem list: 1. MSSA bacteremia and L4 paraspinal abscess Patient originally presented to [facility] ED on 2/8 last on 22 for back pain and fever.  His blood culture returned positive for MSSA bacteremia.  MRI thoracic/lumbar also showed paraspinal myositis with abscess.  Patient however left AMA before receiving antibiotic or abscess aspiration with IR.  Patient returned to the hospital on 07/29/2020 and was admitted to the internal medicine teaching service.  He was started on IV cefazolin.  Also underwent abscess aspiration with IR x 2.  TTE was done and showed trivial tricuspid regurg.  TEE was subsequently performed and showed a thin stranding of mitral valve thought to be chordae tendonae versus vegetation.  Plan for 6 weeks of antibiotic with presumed endocarditis.  During the course of antibiotic cefazolin with transition to nafcillin for possible drug-induced liver injury.  Cefazolin subsequently restarted and transition to oritavancin and 2 weeks of p.o. linezolid.  Patient will follow up with infectious disease in 1 week.  2.  Hepatitis C infection His LFTs was slowly trending upward during this admission.  Right upper quadrant ultrasound was done which is negative for any obstruction.  Initial hepatitis B and C antibody screening was negative.  Hep C viral load was checked and came back 43 millions.  Patient will  follow up with infectious disease for treatment plan.  3.  Polysubstance use Patient reports history of heavy heroin and fentanyl use.  Patient required PCA Dilaudid pump for pain control on admission and transition to p.o. oxycodone. Patient was discharged with oxycodone and will follow up with [facility] methadone  clinic.  Discharge Exam:   BP 106/64 (BP Location: Left Arm)   Pulse 90   Temp 98.6 F (37 C) (Oral)   Resp 17   Ht 5\' 10"  (1.778 m)   Wt 72 kg   SpO2 98%   BMI 22.78 kg/m  Discharge exam:   Physical Exam Constitutional:      General: He is not in acute distress. HENT:     Head: Normocephalic.  Eyes:     General:        Right eye: No discharge.        Left eye: No discharge.  Cardiovascular:     Rate and Rhythm: Normal rate and regular rhythm.  Pulmonary:     Effort: Pulmonary effort is normal. No respiratory distress.  Musculoskeletal:     Right lower leg: No edema.     Left lower leg: No edema.     Comments: Swelling improved of lower back  Skin:    General: Skin is warm.  Psychiatric:        Mood and Affect: Mood normal.    Pertinent Labs, Studies, and Procedures:  CBC Latest Ref Rng & Units 08/04/2020 08/03/2020 08/02/2020  WBC 4.0 - 10.5 K/uL 10.8(H) 10.0 10.0  Hemoglobin 13.0 - 17.0 g/dL 08/04/2020 16.6 06.3  Hematocrit 39.0 - 52.0 % 44.2 45.9 48.2  Platelets 150 - 400 K/uL 382 322 338   CMP Latest Ref Rng & Units 08/04/2020 08/03/2020 08/02/2020  Glucose 70 - 99 mg/dL 08/04/2020) 010(X) 74  BUN 6 - 20 mg/dL 9 11 10   Creatinine 0.61 - 1.24 mg/dL 323(F 5.73  Sodium 135 - 145 mmol/L 136 131(L) 132(L)  Potassium 3.5 - 5.1 mmol/L 4.0 4.1 3.9  Chloride 98 - 111 mmol/L 98 95(L) 91(L)  CO2 22 - 32 mmol/L 28 27 25   Calcium 8.9 - 10.3 mg/dL 8.9 2.20) 2.54)  Total Protein 6.5 - 8.1 g/dL 6.7 6.6 6.8  Total Bilirubin 0.3 - 1.2 mg/dL 0.6 0.6 0.5  Alkaline Phos 38 - 126 U/L 181(H) 162(H) 135(H)  AST 15 - 41 U/L 628(H) 845(H) 686(H)  ALT 0 - 44 U/L 622(H) 624(H) 430(H)   MR HIP RIGHT WO CONTRAST  Result Date: 07/30/2020 IMPRESSION: 1. No acute abnormality of the right hip. No evidence of septic arthritis. 2. Severe lower paraspinous muscle edema, better evaluated on recent lumbar spine MRI. Electronically Signed   By: 2.7(C M.D.   On: 07/30/2020 16:44   IR 09/27/2020  Guide Bx Asp/Drain  Result Date: 07/30/2020 IMPRESSION: Ultrasound-guided aspiration of left L4 paraspinal abscess as above. Electronically Signed   By: 09/27/2020 M.D.   On: 07/30/2020 17:41   DG CHEST PORT 1 VIEW  Result Date: 07/30/2020 IMPRESSION: Decreased patient cooperation resulting in rotated view not fully including the lung bases. No acute cardiopulmonary abnormality identified. Electronically Signed   By: Acquanetta Belling M.D.   On: 07/30/2020 05:10   MRI LUMBAR W WO CONTRAST MRI THORACIC SPINE W CONTRAST 07/27/2020 IMPRESSION: 1. Motion degraded studies. 2. 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 Adjacent inferior right L4 facet marrow edema likely reflects osteomyelitis. Dorsal epidural enhancement  is present at the L4 and L5 levels without abscess formation. 3. Minimal marrow edema and enhancement underlying the T9 superior endplate. Favored to be on a degenerative basis. Alternatively, could reflect early changes of infection.   CT LUMBAR SPINE W CONTRAST 07/31/2020 IMPRESSION: 1. Edema and enhancement of the posterior paraspinous musculature extending as high as inferior L3 and low as low as lower S1, with the epicenter at the L4-5 level. Nonenhancing fluid collections within the posterior soft tissues as high as the superior endplate of L4 and extending as low as inferior S1 on the right side. These are consistent with soft tissue abscesses. Maximal size measured at the L4-5 level is about 2.5 cm bilaterally. There appears to be some dorsal epidural inflammation or early abscess formation at the L4 and L5 levels. This does not appear to grossly compress the thecal sac at this time. 2. 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 Bulging the discs at those levels.  RUQ [place] 08/02/2020 IMPRESSION: Unremarkable right upper quadrant  ultrasound.  Discharge Instructions: Discharge Instructions    Call MD for:  severe uncontrolled pain   Complete by: As directed    Call MD for:  temperature >100.4   Complete by: As directed    Diet - low sodium heart healthy   Complete by: As directed    Discharge instructions   Complete by: As directed    George Summers,  It is a pleasure taking care of you during this admission.  You were admitted for bloodstream infection and abscess of your lower back.  You were treated with IV antibiotic.  Please start taking linezolid as instructed.  Please follow-up with the infectious disease doctor as scheduled for your bloodstream infection and hepatitis C.  I sent a prescription of oxycodone to [facility]  You can take 15 mg every 4 hour as needed for pain.  You should have a prescription of 20 tablets. Please follow-up with the methadone clinic as scheduled.  Please follow-up with the internal medicine clinic to establish primary care physician.  The front desk will give you a call to make an appointment.  Take care   Increase activity slowly   Complete by: As directed    No wound care   Complete by: As directed       Signed: Doran Stabler, DO 08/06/2020, 2:48 PM   Pager: [phone]

## 2020-08-06 NOTE — Progress Notes (Signed)
Pt was given his AVS discharge summary and went over with him. IV was removed with catheter intact. [facility] delivered pt's medications to the room. Pt had no further questions.

## 2020-08-06 NOTE — Progress Notes (Addendum)
HD#7 Subjective:  Overnight Events: no acute event    Evaluated at bedside. Sleeping in bed comfortable. States pain ins tolerable. Denies numbness or weakness of bilateral lower extremities. Plan to Summers 1 dose of oritavancin and switch to oral antibiotics.   Objective:  Vital signs in last 24 hours: Vitals:   08/05/20 0346 08/05/20 0749 08/05/20 1914 08/06/20 0438  BP: 101/68 103/67 111/67 116/66  Pulse: (!) 102 93 89 90  Resp: 14 18 16 16   Temp: 98 F (36.7 C) 98.3 F (36.8 C) 98.4 F (36.9 C) 98.5 F (36.9 C)  TempSrc: Oral Oral Oral Oral  SpO2: 98% 97% 98% 99%  Weight:      Height:       Supplemental O2: Room Air SpO2: 99 % O2 Flow Rate (L/min): 0 L/min FiO2 (%): (!) 0 %   Physical Exam:  Physical Exam Constitutional:      General: He is not in acute distress. HENT:     Head: Normocephalic.  Eyes:     General:        Right eye: No discharge.        Left eye: No discharge.  Cardiovascular:     Rate and Rhythm: Normal rate and regular rhythm.  Pulmonary:     Effort: Pulmonary effort is normal. No respiratory distress.  Musculoskeletal:     Right lower leg: No edema.     Left lower leg: No edema.     Comments: Swelling improved of lower back  Skin:    General: Skin is warm.  Psychiatric:        Mood and Affect: Mood normal.     Filed Weights   07/29/20 2041 07/30/20 1115  Weight: 72 kg 72 kg     Intake/Output Summary (Last 24 hours) at 08/06/2020 0622 Last data filed at 08/05/2020 1503 Gross per 24 hour  Intake 100 ml  Output --  Net 100 ml   Net IO Since Admission: 718.04 mL [08/06/20 0622]  Pertinent Labs: CBC Latest Ref Rng & Units 08/04/2020 08/03/2020 08/02/2020  WBC 4.0 - 10.5 K/uL 10.8(H) 10.0 10.0  Hemoglobin 13.0 - 17.0 g/dL 08/04/2020 73.4 28.7  Hematocrit 39.0 - 52.0 % 44.2 45.9 48.2  Platelets 150 - 400 K/uL 382 322 338    CMP Latest Ref Rng & Units 08/04/2020 08/03/2020 08/02/2020  Glucose 70 - 99 mg/dL 08/04/2020) 157(W) 74  BUN 6 - 20  mg/dL 9 11 10   Creatinine 0.61 - 1.24 mg/dL 620(B 5.59  Sodium 135 - 145 mmol/L 136 131(L) 132(L)  Potassium 3.5 - 5.1 mmol/L 4.0 4.1 3.9  Chloride 98 - 111 mmol/L 98 95(L) 91(L)  CO2 22 - 32 mmol/L 28 27 25   Calcium 8.9 - 10.3 mg/dL 8.9 7.41) 6.38)  Total Protein 6.5 - 8.1 g/dL 6.7 6.6 6.8  Total Bilirubin 0.3 - 1.2 mg/dL 0.6 0.6 0.5  Alkaline Phos 38 - 126 U/L 181(H) 162(H) 135(H)  AST 15 - 41 U/L 628(H) 845(H) 686(H)  ALT 0 - 44 U/L 622(H) 624(H) 430(H)    Imaging: No results found.  Assessment/Plan:   Principal Problem:   MSSA bacteremia Active Problems:   Paraspinal abscess (HCC)   Heroin withdrawal (HCC)   Patient Summary: George Summers is a 40 year old with IV drug use history who was admitted for back pain, found to have a lumbar paraspinal myositis with abscess and MSSA bacteremia,currently treated with IVNafcillin.Status post abscess aspirationx 2with IR. Also positive for hepatitis C infection.  IDis onboard with the treatment team.   MSSA Bacteremia  Lumbar Paraspinal Myositis with Abscess  Osteomyelitis L4 Patient appears clinically well today.  His lower back swelling also improved.  Repeat blood culture negative to date.  Per ID, plan to Summers 1 dose of oritavancin today and transition to oral antibiotics. - Continue to monitor for focal neurological deficit given theearly abscess formation at L4-L5 level without compression of thecal sac.  -Appreciate ID recommendations -Repeat blood cultures,negative to date   Elevated Liver Enzymes Hepatitis C infection Hepatitis C viral load came back 43 million, confirm hep C infection.  Appreciate ID recommendation on treatment plan. -CMP daily   Polysubstance Abuse Currently on Oxycodone 20 mg Q4h, currently doing well on this dose. Plan for transition to Methadone during this admision.  Advised patient to make a follow-up appointment with [facility] methadone clinic.  We will  recheck with him today given the guidelines for starting methadone. -Oxycodone 20 mg Q4h   Code status: Full Code  Diet: Regular  DVT PPx: On Xarelto 10mg  daily  Prior to Admission Living Arrangement:Home Anticipated Discharge Location:to be determined Barriers to Discharge:medical treatment  George Summers 08/06/2020, 6:22 AM Pager: [phone]  Please contact the on call pager after 5 pm and on weekends at [phone].

## 2020-08-09 ENCOUNTER — Telehealth: Payer: Self-pay | Admitting: General Practice

## 2020-08-09 NOTE — Telephone Encounter (Signed)
-----   Message from Doran Stabler, DO sent at 08/06/2020  1:41 PM EST ----- Hello,  Can you please help setting a hospital follow up in 1 week for this patient? He is new to our clinic.   Thank you,  Doran Stabler

## 2020-08-09 NOTE — Telephone Encounter (Signed)
TOC HFU 08/17/2020 at 2:15 pm with Dr. Cyndie Chime.  Patient unable to talk due to waiting for a phone call. Appointment made and mailed to the patient.

## 2020-08-11 NOTE — Telephone Encounter (Signed)
Transition Care Management Follow-up Telephone Call  Date of discharge and from where: Discharged 08/06/20 from the hospital.  How have you been since you were released from the hospital?  Stated he continues to have back pain and his right "gives away". He has shooting pain down his leg. Stated he's out of pain med; he was given enough pain medication until he went to the methadone clinic in which he went this morning. Stated he has an appt to see Dr Drue Second. And he will be on PO abx's.   Items Reviewed:  Did the pt receive and understand the discharge instructions provided? Yes   Medications obtained and verified? Yes   Any new allergies since your discharge? No   Dietary orders reviewed? Yes  Do you have support at home? Yes states he lives with a friend.  Home Care and Equipment/Supplies: Were home health services ordered? no   Functional Questionnaire: (I = Independent and D = Dependent) ADLs: I  Bathing/Dressing- I  Meal Prep- I  Eating- I  Maintaining continence- I  Transferring/Ambulation- I  Managing Meds- I  Follow up appointments reviewed:   PCP Hospital f/u appt confirmed? Yes  Scheduled to see Dr Cyndie Chime on  08/17/20 @ 1415PM; pt stated he did not realized he had an appt; instruction given of our location.  Are transportation arrangements needed? No   If their condition worsens, is the pt aware to call PCP or go to the Emergency Dept.? Yes  Was the patient provided with contact information for the PCP's office or ED? Yes  Was  pt encouraged to call back with questions or concerns? Yes

## 2020-08-13 ENCOUNTER — Other Ambulatory Visit: Payer: Self-pay

## 2020-08-13 ENCOUNTER — Telehealth (INDEPENDENT_AMBULATORY_CARE_PROVIDER_SITE_OTHER): Payer: Self-pay | Admitting: Internal Medicine

## 2020-08-13 ENCOUNTER — Telehealth: Payer: Self-pay

## 2020-08-13 DIAGNOSIS — R7881 Bacteremia: Secondary | ICD-10-CM

## 2020-08-13 DIAGNOSIS — B9561 Methicillin susceptible Staphylococcus aureus infection as the cause of diseases classified elsewhere: Secondary | ICD-10-CM

## 2020-08-13 NOTE — Progress Notes (Signed)
RN called patient for his telephone visit. Patient asked for pain medication, said he picked up antibiotics but hasn't started them yet. RN let him know I was the nurse, doing the questions for his hospital follow up visit and I couldn't order pain medication. He complains that someone sent his robaxin to a different [facility]. RN let him know that this can be transferred from one location to another, he can request his [facility] to do so. 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 RN offered to reschedule, patient hung up the phone.  Will mark patient as No-Show since he hung up. Andree Coss, RN

## 2020-08-13 NOTE — Telephone Encounter (Signed)
Received call in triage patient requesting follow up appointment. Patient has been in and hospital due to blood stream infection and due for follow up visit. Patient offered same day visit with Dr. Earlene Plater and patient accepts appointment.  George Summers

## 2020-08-17 ENCOUNTER — Ambulatory Visit: Payer: Self-pay | Admitting: Student

## 2020-08-17 ENCOUNTER — Encounter: Payer: Self-pay | Admitting: Student

## 2020-08-17 VITALS — BP 105/64 | HR 102 | Temp 98.2°F | Ht 70.0 in | Wt 139.9 lb

## 2020-08-17 DIAGNOSIS — F1123 Opioid dependence with withdrawal: Secondary | ICD-10-CM

## 2020-08-17 DIAGNOSIS — R7881 Bacteremia: Secondary | ICD-10-CM

## 2020-08-17 DIAGNOSIS — F1193 Opioid use, unspecified with withdrawal: Secondary | ICD-10-CM

## 2020-08-17 NOTE — Progress Notes (Addendum)
George Summers is a 40 year old male with past medical history of polysubstance use who presented to the clinic for hospital follow-up.  He was hospitalized for MSSA bacteremia and L4 paraspinal abscess.  Patient received 1 dose of oritavancin and continue with linezolid for 4 weeks.  Patient was to start linezolid on February 25th but delayed until Sunday, February 27th.  Patient states that he was supposed to have a telehealth visit with Dr. Earlene Plater with infectious disease but states that Dr. Earlene Plater never called him. Please see note of Video Visit on 08/13/2020 for detail.   He was on PCA Dilaudid pump in the hospital and transition to p.o. oxycodone.  He followed up with the methadone clinic and was started on methadone 30 mg.  Patient states that the methadone did not help with his back pain.  He requests if our clinic can prescribe him additional opioids.  I explain to him that if he is seeing a methadone clinic, we cannot prescribe additional opioids. I offer other pain regimen such as Tylenol or NSAIDS but he denies. Patient states that he asked his counselor and they said it was okay for [place] to prescribe him opioids. I told him that I can give the methadone clinic a call to ask about their policy with outside opioids prescription. Patient is upset and states that we do not trust him. I explain to him that I cannot prescribe addition opioids before talking to the methadone clinic. Patient states that he no longer wants me to be his PCP. I offer to reschedule him to a different provider in the clinic. Patient states that he is no longer want to be seen by the [facility] clinic and leave the clinic.   Hopefully patient will continue to follow up with Methadone clinic to avoid withdrawal.   Doran Stabler, DO

## 2020-08-18 NOTE — Progress Notes (Signed)
Internal Medicine Clinic Attending  Case discussed with Dr. Cyndie Chime  At the time of the visit.  We reviewed the resident's history and exam and pertinent patient test results.  I agree with the assessment, diagnosis, and plan of care documented in the resident's note.   Patient left prior to completion of visit. Stormed out of clinic after being told we would not be prescribing opioids. He recently started methadone for OUD.

## 2020-11-07 ENCOUNTER — Encounter (HOSPITAL_COMMUNITY): Payer: Self-pay | Admitting: Emergency Medicine

## 2020-11-07 ENCOUNTER — Other Ambulatory Visit: Payer: Self-pay

## 2020-11-07 ENCOUNTER — Emergency Department (HOSPITAL_COMMUNITY)
Admission: EM | Admit: 2020-11-07 | Discharge: 2020-11-07 | Disposition: A | Discharge status: Home / Self Care | Payer: Self-pay | Attending: Emergency Medicine | Admitting: Emergency Medicine

## 2020-11-07 DIAGNOSIS — S20361A Insect bite (nonvenomous) of right front wall of thorax, initial encounter: Secondary | ICD-10-CM | POA: Insufficient documentation

## 2020-11-07 DIAGNOSIS — W57XXXA Bitten or stung by nonvenomous insect and other nonvenomous arthropods, initial encounter: Secondary | ICD-10-CM | POA: Insufficient documentation

## 2020-11-07 DIAGNOSIS — F17210 Nicotine dependence, cigarettes, uncomplicated: Secondary | ICD-10-CM | POA: Insufficient documentation

## 2020-11-07 DIAGNOSIS — R21 Rash and other nonspecific skin eruption: Secondary | ICD-10-CM

## 2020-11-07 LAB — CBC WITH DIFFERENTIAL/PLATELET
Abs Immature Granulocytes: 0.03 10*3/uL (ref 0.00–0.07)
Basophils Absolute: 0.1 10*3/uL (ref 0.0–0.1)
Basophils Relative: 1 %
Eosinophils Absolute: 0.2 10*3/uL (ref 0.0–0.5)
Eosinophils Relative: 2 %
HCT: 47.5 % (ref 39.0–52.0)
Hemoglobin: 15.6 g/dL (ref 13.0–17.0)
Immature Granulocytes: 0 %
Lymphocytes Relative: 28 %
Lymphs Abs: 2.8 10*3/uL (ref 0.7–4.0)
MCH: 30.4 pg (ref 26.0–34.0)
MCHC: 32.8 g/dL (ref 30.0–36.0)
MCV: 92.6 fL (ref 80.0–100.0)
Monocytes Absolute: 0.8 10*3/uL (ref 0.1–1.0)
Monocytes Relative: 8 %
Neutro Abs: 6.1 10*3/uL (ref 1.7–7.7)
Neutrophils Relative %: 61 %
Platelets: 225 10*3/uL (ref 150–400)
RBC: 5.13 MIL/uL (ref 4.22–5.81)
RDW: 14.4 % (ref 11.5–15.5)
WBC: 10.1 10*3/uL (ref 4.0–10.5)
nRBC: 0 % (ref 0.0–0.2)

## 2020-11-07 LAB — COMPREHENSIVE METABOLIC PANEL
ALT: 233 U/L — ABNORMAL HIGH (ref 0–44)
AST: 168 U/L — ABNORMAL HIGH (ref 15–41)
Albumin: 4.3 g/dL (ref 3.5–5.0)
Alkaline Phosphatase: 101 U/L (ref 38–126)
Anion gap: 8 (ref 5–15)
BUN: 21 mg/dL — ABNORMAL HIGH (ref 6–20)
CO2: 29 mmol/L (ref 22–32)
Calcium: 9.3 mg/dL (ref 8.9–10.3)
Chloride: 99 mmol/L (ref 98–111)
Creatinine, Ser: 0.7 mg/dL (ref 0.61–1.24)
GFR, Estimated: >60 mL/min (ref >60)
Glucose, Bld: 147 mg/dL — ABNORMAL HIGH (ref 70–99)
Potassium: 4.3 mmol/L (ref 3.5–5.1)
Sodium: 136 mmol/L (ref 135–145)
Total Bilirubin: 0.6 mg/dL (ref 0.3–1.2)
Total Protein: 7.8 g/dL (ref 6.5–8.1)

## 2020-11-07 MED ORDER — DOXYCYCLINE HYCLATE 100 MG PO CAPS: 100.0000 mg | ORAL_CAPSULE | Freq: Two times a day (BID) | ORAL | 0 refills | Status: AC

## 2020-11-07 NOTE — Discharge Instructions (Signed)
Take the antibiotics as prescribed. Return for new or worsening symptoms. °

## 2020-11-07 NOTE — ED Provider Notes (Signed)
Emergency Medicine Provider Triage Evaluation Note  George Summers , a 40 y.o. male  was evaluated in triage.  Pt complains of rash after tick bite. Found a tick on right shoulder Sunday 1 week. Rash started on Tuesday, whole body, with itching. Feels feverish but hasn't checked temp- feels extra sweaty especially at night. Also left shoulder pain and back pain. No joint swelling.   Review of Systems  Positive: Body aches, rash, tick bite Negative: vomiting  Physical Exam  BP 119/84 (BP Location: Right Arm)   Pulse 78   Temp 98.4 F (36.9 C) (Oral)   Resp 18   SpO2 100%  Gen:   Awake, no distress   Resp:  Normal effort  MSK:   Moves extremities without difficulty  Other:  Rash to trunk  Medical Decision Making  Medically screening exam initiated at 3:02 PM.  Appropriate orders placed.  AUBERT CHOYCE was informed that the remainder of the evaluation will be completed by another provider, this initial triage assessment does not replace that evaluation, and the importance of remaining in the ED until their evaluation is complete.     Jeannie Fend, PA-C 11/07/20 1504    Arby Barrette, MD 11/23/20 1048

## 2020-11-07 NOTE — ED Triage Notes (Signed)
Patient got bit by a tick last Sunday, states he removed it and the head, noticed a rash on Tuesday. Endorses sweats and cold chills, diffuse rash that is itchy, states he has [facility] Spotted Fever.

## 2020-11-07 NOTE — ED Provider Notes (Signed)
[facility]-EMERGENCY DEPT Provider Note   CSN: [id number] Arrival date & time: 11/07/20  1453     History Tick Bite   George Summers is a 40 y.o. male with past medical history significant for MSSA bacteremia, IV drug use who presents for evaluation of tick bite.  Patient states he found a tick on his right shoulder on Sunday.  States 3 days later he developed a diffuse pruritic rash.  Located to his entire body. Non tender. No rash to mouth, mucosa. No rash to palms or soles.  He has felt warm however is not taken his temperature.  He states he looked up the tick and thinks it was a [employer] tick.  He feels like he has [facility] spotted fever according to his "Microbiologist."  He denies any headache, nausea, vomiting, neck pain, neck stiffness, chest pain, shortness of breath, back pain, paresthesias, weakness. No urinary complaints or vesicular rash. He has been tolerating p.o. intake at home.  He denies any joint swelling.  Denies additional aggravating or alleviating factors.  Rates his current pain a 0/10.   History obtained from patient and past medical records.  No interpreter used  HPI     Past Medical History:  Diagnosis Date  . GI (gastrointestinal bleed)    PT reports a stomach ulcer  . 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 MSSA bacteremia 07/30/2020  . Paraspinal abscess (HCC) 07/27/2020  . Acute midline low back pain   . Epidural abscess   . Heroin withdrawal (HCC)     Past Surgical History:  Procedure Laterality Date  . BUBBLE STUDY  08/02/2020   Procedure: BUBBLE STUDY;  Surgeon: Quintella Reichert, MD;  Location: [facility] ENDOSCOPY;  Service: Cardiovascular;;  . IR US GUIDE BX ASP/DRAIN  07/30/2020  . TEE WITHOUT CARDIOVERSION N/A 08/02/2020   Procedure: TRANSESOPHAGEAL ECHOCARDIOGRAM (TEE);  Surgeon: Quintella Reichert, MD;  Location: [facility] ENDOSCOPY;  Service: Cardiovascular;  Laterality: N/A;  .  TONSILLECTOMY         No family history on file.  Social History   Tobacco Use  . Smoking status: Current Every Day Smoker    Types: Cigarettes  . Smokeless tobacco: Never Used  Substance Use Topics  . Alcohol use: Yes    Comment: Pt drank last night.  . Drug use: Yes    Types: Marijuana    Comment: Last smoke was last night 06-20-15    Home Medications Prior to Admission medications   Medication Sig Start Date End Date Taking? Authorizing Provider  doxycycline (VIBRAMYCIN) 100 MG capsule Take 1 capsule (100 mg total) by mouth 2 (two) times daily for 21 days. 11/07/20 11/28/20 Yes Janya Eveland A, PA-C  ibuprofen (ADVIL) 200 MG tablet Take 1,200 mg by mouth every 6 (six) hours as needed for headache or moderate pain.    [provider]  linezolid (ZYVOX) 600 MG tablet TAKE 1 TABLET (600 MG TOTAL) BY MOUTH TWO TIMES DAILY FOR 28 DAYS. START TAKING IN 1 WEEK ON 08/13/20 08/06/20 08/06/21  Anne Shutter, MD  methocarbamol (ROBAXIN) 500 MG tablet Take 1 tablet (500 mg total) by mouth 2 (two) times daily. 07/27/20   Gailen Shelter, PA  naloxone [facility]) 2 MG/2ML injection Place 1 mL (1 mg total) into the nose as needed. Opioid overdose. 08/06/20   Doran Stabler, DO  naloxone [facility]) 2 MG/2ML injection  PLACE 1 ML (1 MG TOTAL) INTO THE NOSE AS NEEDED. OPIOID OVERDOSE. 08/06/20 08/06/21  Doran Stabler, DO  oxyCODONE (ROXICODONE) 15 MG immediate release tablet Take 1 tablet (15 mg total) by mouth every 4 (four) hours as needed for pain. 08/06/20   Doran Stabler, DO  oxyCODONE (ROXICODONE) 15 MG immediate release tablet TAKE 1 TABLET (15 MG TOTAL) BY MOUTH EVERY 4 (FOUR) HOURS AS NEEDED FOR PAIN. 08/06/20 02/02/21  Doran Stabler, DO    Allergies    Patient has no known allergies.  Review of Systems   Review of Systems  Constitutional: Negative.  Negative for fever.  HENT: Negative.   Respiratory: Negative.   Cardiovascular: Negative.   Gastrointestinal: Negative.   Genitourinary:  Negative.   Musculoskeletal: Negative.   Skin: Positive for rash.  Neurological: Negative.   All other systems reviewed and are negative.   Physical Exam Updated Vital Signs BP 119/84 (BP Location: Right Arm)   Pulse 78   Temp 98.4 F (36.9 C) (Oral)   Resp 18   Ht 5\' 10"  (1.778 m)   Wt 65.8 kg   SpO2 100%   BMI 20.81 kg/m   Physical Exam Vitals and nursing note reviewed.  Constitutional:      General: He is not in acute distress.    Appearance: He is well-developed. He is not ill-appearing, toxic-appearing or diaphoretic.  HENT:     Head: Normocephalic and atraumatic.     Nose: Nose normal.     Mouth/Throat:     Mouth: Mucous membranes are moist.     Comments: No intraoral lesions Eyes:     Pupils: Pupils are equal, round, and reactive to light.  Cardiovascular:     Rate and Rhythm: Normal rate and regular rhythm.     Pulses: Normal pulses.          Radial pulses are 2+ on the right side and 2+ on the left side.     Heart sounds: Normal heart sounds.     Comments: No murmur Pulmonary:     Effort: Pulmonary effort is normal. No respiratory distress.     Breath sounds: Normal breath sounds.     Comments: Clear to auscultation bilaterally. Abdominal:     General: Bowel sounds are normal. There is no distension.     Palpations: Abdomen is soft.     Tenderness: There is no abdominal tenderness. There is no guarding or rebound.     Comments: Abdomen soft, nontender  Musculoskeletal:        General: Normal range of motion.     Cervical back: Normal range of motion and neck supple.     Comments: No bony tenderness.  Moves all 4 extremities without difficulty.  No midline spinal tenderness.  No neck stiffness or neck rigidity.  Skin:    General: Skin is warm and dry.     Capillary Refill: Capillary refill takes less than 2 seconds.     Findings: Rash present.     Comments: Erythematous, maculopapular rash to extremities and trunk.  No intraoral lesions.  No pustules,  vesicles, target lesions.  No rash to palms or soles.  No bulla, desquamated skin.  No petechiae, purpura.  No Janeway lesions, Osler nodes. No splinter hemorrhages.  Neurological:     Mental Status: He is alert.       ED Results / Procedures / Treatments   Labs (all labs ordered are listed, but only abnormal results are displayed) Labs Reviewed  COMPREHENSIVE  METABOLIC PANEL - Abnormal; Notable for the following components:      Result Value   Glucose, Bld 147 (*)    BUN 21 (*)    AST 168 (*)    ALT 233 (*)    All other components within normal limits  CBC WITH DIFFERENTIAL/PLATELET  ROCKY MTN SPOTTED FVR ABS PNL(IGG+IGM)  B. BURGDORFI ANTIBODIES    EKG None  Radiology No results found.  Procedures Procedures   Medications Ordered in ED Medications - No data to display  ED Course  I have reviewed the triage vital signs and the nursing notes.  Pertinent labs & imaging results that were available during my care of the patient were reviewed by me and considered in my medical decision making (see chart for details).  40 year old here for evaluation of rash after being bitten by a tick approxi-1 week ago.  He is afebrile, nonseptic, not ill-appearing.  Patient has diffuse maculopapular rash.  He has no joint involvement.  He has a nonfocal neuro exam without deficits.  He denies any headache, neck pain, neck stiffness, midline back pain.  Patient does have history of IV drug use with prior spinal abscess, MSSA bacteremia.  Does state he completed the antibiotics through ID however per chart review this sounds questionable.  He has no meningismus.  Rash is pruritic in nature however nontender.  Work-up started from triage today personally viewed and interpreted  CBC without leukocytosis Metabolic with glucose 147, elevation LFTs however improved from baseline.  Patient has history of hepatitis C[facility], burgdorferi antibodies pending.  When asking patient  about his IV drug use he does admit to still intermittently using.  Question patient whether we need further work-up however patient declined stating "I am only here for my antibiotics."  When asked about this he said that he "will tell me any doxycycline."  Patient does not want any further testing at this time.  I discussed with patient if he did not complete the IV antibiotics that it is possible to have missed bacteremia causing rash.  Patient voiced understanding and does NOT want any additional work-up aside from his original blood draw from triage. Refuses repeat blood cultures.  No murmur on exam.  No chest pain, shortness of breath, tachycardia, tachypnea or hypoxia.  Will DC home with doxycycline.  I discussed strict return precautions and to return for any worsening symptoms.  Patient does not have any neurologic deficits to suggest neurologic complication, meningitis.  The patient has been appropriately medically screened and/or stabilized in the ED. I have low suspicion for any other emergent medical condition which would require further screening, evaluation or treatment in the ED or require inpatient management.  Patient is hemodynamically stable and in no acute distress.  Patient able to ambulate in department prior to ED.  Evaluation does not show acute pathology that would require ongoing or additional emergent interventions while in the emergency department or further inpatient treatment.  I have discussed the diagnosis with the patient and answered all questions.  Pain is been managed while in the emergency department and patient has no further complaints prior to discharge.  Patient is comfortable with plan discussed in room and is stable for discharge at this time.  I have discussed strict return precautions for returning to the emergency department.  Patient was encouraged to follow-up with PCP/specialist refer to at discharge.    MDM Rules/Calculators/A&P  Final Clinical Impression(s) / ED Diagnoses Final diagnoses:  Tick bite of right front wall of thorax, initial encounter  Rash    Rx / DC Orders ED Discharge Orders         Ordered    doxycycline (VIBRAMYCIN) 100 MG capsule  2 times daily        11/07/20 1745           Lancelot Alyea A, PA-C 11/07/20 1748    Charlynne Pander, MD 11/07/20 2258

## 2020-11-10 ENCOUNTER — Encounter (HOSPITAL_COMMUNITY): Payer: Self-pay | Admitting: Emergency Medicine

## 2020-11-10 ENCOUNTER — Emergency Department (HOSPITAL_COMMUNITY)
Admission: EM | Admit: 2020-11-10 | Discharge: 2020-11-10 | Discharge status: Against Medical Advice | Payer: Self-pay | Attending: Emergency Medicine | Admitting: Emergency Medicine

## 2020-11-10 ENCOUNTER — Other Ambulatory Visit: Payer: Self-pay

## 2020-11-10 ENCOUNTER — Emergency Department (HOSPITAL_COMMUNITY): Payer: Self-pay

## 2020-11-10 DIAGNOSIS — F17210 Nicotine dependence, cigarettes, uncomplicated: Secondary | ICD-10-CM | POA: Insufficient documentation

## 2020-11-10 DIAGNOSIS — R6884 Jaw pain: Secondary | ICD-10-CM | POA: Insufficient documentation

## 2020-11-10 DIAGNOSIS — S0083XA Contusion of other part of head, initial encounter: Secondary | ICD-10-CM

## 2020-11-10 DIAGNOSIS — S0993XA Unspecified injury of face, initial encounter: Secondary | ICD-10-CM

## 2020-11-10 DIAGNOSIS — S0512XA Contusion of eyeball and orbital tissues, left eye, initial encounter: Secondary | ICD-10-CM | POA: Insufficient documentation

## 2020-11-10 LAB — ROCKY MTN SPOTTED FVR ABS PNL(IGG+IGM)
RMSF IgG: NEGATIVE
RMSF IgM: 0.38 index (ref 0.00–0.89)

## 2020-11-10 MED ORDER — IBUPROFEN 200 MG PO TABS: 600.0000 mg | ORAL_TABLET | Freq: Once | ORAL | Status: DC

## 2020-11-10 MED ORDER — ACETAMINOPHEN 500 MG PO TABS
1000.0000 mg | ORAL_TABLET | Freq: Once | ORAL | Status: AC
  Administered 2020-11-10: 1000 mg via ORAL
  Filled 2020-11-10: qty 2

## 2020-11-10 NOTE — ED Notes (Signed)
CT attempted to take pt to CT, not visualized in the room. Had spoken with 5 min ago and pt said he did not have time to wait around all day for a CT, encouraged to stay and PA notified.
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
## 2020-11-16 LAB — B. BURGDORFI ANTIBODIES

## 2020-11-16 LAB — MISC LABCORP TEST (SEND OUT): Labcorp test code: 164226

## 2020-12-11 ENCOUNTER — Other Ambulatory Visit: Payer: Self-pay

## 2020-12-11 ENCOUNTER — Emergency Department (HOSPITAL_COMMUNITY)
Admission: EM | Admit: 2020-12-11 | Discharge: 2020-12-11 | Discharge status: Against Medical Advice | Payer: Self-pay | Attending: Emergency Medicine | Admitting: Emergency Medicine

## 2020-12-11 ENCOUNTER — Encounter (HOSPITAL_COMMUNITY): Payer: Self-pay | Admitting: Emergency Medicine

## 2020-12-11 DIAGNOSIS — R5383 Other fatigue: Secondary | ICD-10-CM | POA: Insufficient documentation

## 2020-12-11 DIAGNOSIS — R079 Chest pain, unspecified: Secondary | ICD-10-CM | POA: Insufficient documentation

## 2020-12-11 DIAGNOSIS — Z5321 Procedure and treatment not carried out due to patient leaving prior to being seen by health care provider: Secondary | ICD-10-CM

## 2020-12-11 DIAGNOSIS — R519 Headache, unspecified: Secondary | ICD-10-CM | POA: Insufficient documentation

## 2020-12-11 DIAGNOSIS — R21 Rash and other nonspecific skin eruption: Secondary | ICD-10-CM | POA: Insufficient documentation

## 2020-12-11 DIAGNOSIS — F17210 Nicotine dependence, cigarettes, uncomplicated: Secondary | ICD-10-CM | POA: Insufficient documentation

## 2020-12-11 DIAGNOSIS — R0602 Shortness of breath: Secondary | ICD-10-CM | POA: Insufficient documentation

## 2020-12-11 DIAGNOSIS — R110 Nausea: Secondary | ICD-10-CM | POA: Insufficient documentation

## 2020-12-11 DIAGNOSIS — Z5329 Procedure and treatment not carried out because of patient's decision for other reasons: Secondary | ICD-10-CM

## 2020-12-11 LAB — TROPONIN I (HIGH SENSITIVITY): Troponin I (High Sensitivity): <2 ng/L (ref <18)

## 2020-12-11 LAB — COMPREHENSIVE METABOLIC PANEL
ALT: 96 U/L — ABNORMAL HIGH (ref 0–44)
AST: 78 U/L — ABNORMAL HIGH (ref 15–41)
Albumin: 4.5 g/dL (ref 3.5–5.0)
Alkaline Phosphatase: 93 U/L (ref 38–126)
Anion gap: 9 (ref 5–15)
BUN: 26 mg/dL — ABNORMAL HIGH (ref 6–20)
CO2: 27 mmol/L (ref 22–32)
Calcium: 9.5 mg/dL (ref 8.9–10.3)
Chloride: 97 mmol/L — ABNORMAL LOW (ref 98–111)
Creatinine, Ser: 0.7 mg/dL (ref 0.61–1.24)
GFR, Estimated: >60 mL/min (ref >60)
Glucose, Bld: 99 mg/dL (ref 70–99)
Potassium: 3.8 mmol/L (ref 3.5–5.1)
Sodium: 133 mmol/L — ABNORMAL LOW (ref 135–145)
Total Bilirubin: 0.6 mg/dL (ref 0.3–1.2)
Total Protein: 8 g/dL (ref 6.5–8.1)

## 2020-12-11 LAB — CBC WITH DIFFERENTIAL/PLATELET
Abs Immature Granulocytes: 0.02 10*3/uL (ref 0.00–0.07)
Basophils Absolute: 0.1 10*3/uL (ref 0.0–0.1)
Basophils Relative: 1 %
Eosinophils Absolute: 0.3 10*3/uL (ref 0.0–0.5)
Eosinophils Relative: 3 %
HCT: 47.9 % (ref 39.0–52.0)
Hemoglobin: 16.3 g/dL (ref 13.0–17.0)
Immature Granulocytes: 0 %
Lymphocytes Relative: 30 %
Lymphs Abs: 3.2 10*3/uL (ref 0.7–4.0)
MCH: 31.3 pg (ref 26.0–34.0)
MCHC: 34 g/dL (ref 30.0–36.0)
MCV: 91.9 fL (ref 80.0–100.0)
Monocytes Absolute: 1.1 10*3/uL — ABNORMAL HIGH (ref 0.1–1.0)
Monocytes Relative: 11 %
Neutro Abs: 5.8 10*3/uL (ref 1.7–7.7)
Neutrophils Relative %: 55 %
Platelets: 252 10*3/uL (ref 150–400)
RBC: 5.21 MIL/uL (ref 4.22–5.81)
RDW: 13.4 % (ref 11.5–15.5)
WBC: 10.5 10*3/uL (ref 4.0–10.5)
nRBC: 0 % (ref 0.0–0.2)
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
## 2020-12-11 NOTE — ED Notes (Signed)
Patient left before VS could be taken
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
## 2020-12-17 LAB — CULTURE, BLOOD (ROUTINE X 2)
Culture: NO GROWTH
Culture: NO GROWTH
Special Requests: ADEQUATE
Special Requests: ADEQUATE

## 2021-03-05 ENCOUNTER — Emergency Department (HOSPITAL_COMMUNITY): Payer: Self-pay

## 2021-03-05 ENCOUNTER — Encounter (HOSPITAL_COMMUNITY): Payer: Self-pay | Admitting: Emergency Medicine

## 2021-03-05 ENCOUNTER — Other Ambulatory Visit: Payer: Self-pay

## 2021-03-05 ENCOUNTER — Emergency Department (HOSPITAL_COMMUNITY)
Admission: EM | Admit: 2021-03-05 | Discharge: 2021-03-05 | Disposition: A | Discharge status: Home / Self Care | Payer: Self-pay | Attending: Emergency Medicine | Admitting: Emergency Medicine

## 2021-03-05 DIAGNOSIS — T1490XA Injury, unspecified, initial encounter: Secondary | ICD-10-CM

## 2021-03-05 DIAGNOSIS — S80812A Abrasion, left lower leg, initial encounter: Secondary | ICD-10-CM | POA: Insufficient documentation

## 2021-03-05 DIAGNOSIS — S92335A Nondisplaced fracture of third metatarsal bone, left foot, initial encounter for closed fracture: Secondary | ICD-10-CM | POA: Insufficient documentation

## 2021-03-05 DIAGNOSIS — S31119A Laceration without foreign body of abdominal wall, unspecified quadrant without penetration into peritoneal cavity, initial encounter: Secondary | ICD-10-CM | POA: Insufficient documentation

## 2021-03-05 DIAGNOSIS — Z20822 Contact with and (suspected) exposure to covid-19: Secondary | ICD-10-CM | POA: Insufficient documentation

## 2021-03-05 DIAGNOSIS — S82842A Displaced bimalleolar fracture of left lower leg, initial encounter for closed fracture: Secondary | ICD-10-CM | POA: Insufficient documentation

## 2021-03-05 DIAGNOSIS — F17210 Nicotine dependence, cigarettes, uncomplicated: Secondary | ICD-10-CM | POA: Insufficient documentation

## 2021-03-05 DIAGNOSIS — Y900 Blood alcohol level of less than 20 mg/100 ml: Secondary | ICD-10-CM | POA: Insufficient documentation

## 2021-03-05 DIAGNOSIS — T07XXXA Unspecified multiple injuries, initial encounter: Secondary | ICD-10-CM

## 2021-03-05 DIAGNOSIS — E871 Hypo-osmolality and hyponatremia: Secondary | ICD-10-CM | POA: Insufficient documentation

## 2021-03-05 DIAGNOSIS — M25572 Pain in left ankle and joints of left foot: Secondary | ICD-10-CM | POA: Insufficient documentation

## 2021-03-05 DIAGNOSIS — Y92410 Unspecified street and highway as the place of occurrence of the external cause: Secondary | ICD-10-CM | POA: Insufficient documentation

## 2021-03-05 DIAGNOSIS — R109 Unspecified abdominal pain: Secondary | ICD-10-CM | POA: Insufficient documentation

## 2021-03-05 DIAGNOSIS — Z79899 Other long term (current) drug therapy: Secondary | ICD-10-CM | POA: Insufficient documentation

## 2021-03-05 DIAGNOSIS — R000 Tachycardia, unspecified: Secondary | ICD-10-CM | POA: Insufficient documentation

## 2021-03-05 LAB — COMPREHENSIVE METABOLIC PANEL
ALT: 49 U/L — ABNORMAL HIGH (ref 0–44)
AST: 61 U/L — ABNORMAL HIGH (ref 15–41)
Albumin: 3.5 g/dL (ref 3.5–5.0)
Alkaline Phosphatase: 76 U/L (ref 38–126)
Anion gap: 8 (ref 5–15)
BUN: 18 mg/dL (ref 6–20)
CO2: 25 mmol/L (ref 22–32)
Calcium: 8.7 mg/dL — ABNORMAL LOW (ref 8.9–10.3)
Chloride: 96 mmol/L — ABNORMAL LOW (ref 98–111)
Creatinine, Ser: 0.64 mg/dL (ref 0.61–1.24)
GFR, Estimated: >60 mL/min (ref >60)
Glucose, Bld: 114 mg/dL — ABNORMAL HIGH (ref 70–99)
Potassium: 4.6 mmol/L (ref 3.5–5.1)
Sodium: 129 mmol/L — ABNORMAL LOW (ref 135–145)
Total Bilirubin: 0.5 mg/dL (ref 0.3–1.2)
Total Protein: 6 g/dL — ABNORMAL LOW (ref 6.5–8.1)

## 2021-03-05 LAB — URINALYSIS, ROUTINE W REFLEX MICROSCOPIC
Bilirubin Urine: NEGATIVE
Glucose, UA: NEGATIVE mg/dL
Ketones, ur: NEGATIVE mg/dL
Leukocytes,Ua: NEGATIVE
Nitrite: NEGATIVE
Protein, ur: NEGATIVE mg/dL
Specific Gravity, Urine: 1.01 (ref 1.005–1.030)
pH: 6 (ref 5.0–8.0)

## 2021-03-05 LAB — RAPID URINE DRUG SCREEN, HOSP PERFORMED
Amphetamines: POSITIVE — AB
Barbiturates: NOT DETECTED
Benzodiazepines: NOT DETECTED
Cocaine: POSITIVE — AB
Opiates: POSITIVE — AB
Tetrahydrocannabinol: POSITIVE — AB

## 2021-03-05 LAB — RESP PANEL BY RT-PCR (FLU A&B, COVID) ARPGX2
Influenza A by PCR: NEGATIVE
Influenza B by PCR: NEGATIVE
SARS Coronavirus 2 by RT PCR: NEGATIVE

## 2021-03-05 LAB — URINALYSIS, MICROSCOPIC (REFLEX): Bacteria, UA: NONE SEEN

## 2021-03-05 LAB — CBC WITH DIFFERENTIAL/PLATELET
Abs Immature Granulocytes: 0.08 10*3/uL — ABNORMAL HIGH (ref 0.00–0.07)
Basophils Absolute: 0.1 10*3/uL (ref 0.0–0.1)
Basophils Relative: 1 %
Eosinophils Absolute: 0.5 10*3/uL (ref 0.0–0.5)
Eosinophils Relative: 3 %
HCT: 38.5 % — ABNORMAL LOW (ref 39.0–52.0)
Hemoglobin: 12.9 g/dL — ABNORMAL LOW (ref 13.0–17.0)
Immature Granulocytes: 1 %
Lymphocytes Relative: 18 %
Lymphs Abs: 3.2 10*3/uL (ref 0.7–4.0)
MCH: 31.2 pg (ref 26.0–34.0)
MCHC: 33.5 g/dL (ref 30.0–36.0)
MCV: 93.2 fL (ref 80.0–100.0)
Monocytes Absolute: 1.7 10*3/uL — ABNORMAL HIGH (ref 0.1–1.0)
Monocytes Relative: 10 %
Neutro Abs: 12.1 10*3/uL — ABNORMAL HIGH (ref 1.7–7.7)
Neutrophils Relative %: 67 %
Platelets: 301 10*3/uL (ref 150–400)
RBC: 4.13 MIL/uL — ABNORMAL LOW (ref 4.22–5.81)
RDW: 14 % (ref 11.5–15.5)
WBC: 17.6 10*3/uL — ABNORMAL HIGH (ref 4.0–10.5)
nRBC: 0 % (ref 0.0–0.2)

## 2021-03-05 LAB — I-STAT CHEM 8, ED
BUN: 20 mg/dL (ref 6–20)
Calcium, Ion: 1.17 mmol/L (ref 1.15–1.40)
Chloride: 96 mmol/L — ABNORMAL LOW (ref 98–111)
Creatinine, Ser: 0.6 mg/dL — ABNORMAL LOW (ref 0.61–1.24)
Glucose, Bld: 120 mg/dL — ABNORMAL HIGH (ref 70–99)
HCT: 40 % (ref 39.0–52.0)
Hemoglobin: 13.6 g/dL (ref 13.0–17.0)
Potassium: 4.4 mmol/L (ref 3.5–5.1)
Sodium: 130 mmol/L — ABNORMAL LOW (ref 135–145)
TCO2: 25 mmol/L (ref 22–32)

## 2021-03-05 LAB — SAMPLE TO BLOOD BANK

## 2021-03-05 LAB — ETHANOL: Alcohol, Ethyl (B): <10 mg/dL (ref <10)

## 2021-03-05 LAB — PROTIME-INR
INR: 1 (ref 0.8–1.2)
Prothrombin Time: 12.7 seconds (ref 11.4–15.2)

## 2021-03-05 LAB — LACTIC ACID, PLASMA: Lactic Acid, Venous: 1.1 mmol/L (ref 0.5–1.9)
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
## 2021-03-05 MED ORDER — ONDANSETRON HCL 4 MG/2ML IJ SOLN
4.0000 mg | Freq: Once | INTRAMUSCULAR | Status: AC
  Administered 2021-03-05: 4 mg via INTRAVENOUS
  Filled 2021-03-05: qty 2

## 2021-03-05 MED ORDER — CEFAZOLIN SODIUM-DEXTROSE 2-4 GM/100ML-% IV SOLN
2.0000 g | Freq: Once | INTRAVENOUS | Status: AC
  Administered 2021-03-05: 2 g via INTRAVENOUS
  Filled 2021-03-05: qty 100

## 2021-03-05 MED ORDER — IBUPROFEN 400 MG PO TABS
600.0000 mg | ORAL_TABLET | Freq: Once | ORAL | Status: AC
  Administered 2021-03-05: 600 mg via ORAL
  Filled 2021-03-05: qty 1

## 2021-03-05 MED ORDER — TETANUS-DIPHTH-ACELL PERTUSSIS 5-2.5-18.5 LF-MCG/0.5 IM SUSY
0.5000 mL | PREFILLED_SYRINGE | Freq: Once | INTRAMUSCULAR | Status: DC
  Filled 2021-03-05: qty 0.5

## 2021-03-05 MED ORDER — ONDANSETRON 4 MG PO TBDP: ORAL_TABLET | ORAL | 0 refills | Status: DC

## 2021-03-05 MED ORDER — OXYCODONE-ACETAMINOPHEN 5-325 MG PO TABS
1.0000 | ORAL_TABLET | Freq: Once | ORAL | Status: AC
Start: 2021-03-05 — End: 2021-03-05
  Administered 2021-03-05: 1 via ORAL
  Filled 2021-03-05: qty 1

## 2021-03-05 MED ORDER — CEPHALEXIN 500 MG PO CAPS: 500.0000 mg | ORAL_CAPSULE | Freq: Four times a day (QID) | ORAL | 0 refills | Status: DC

## 2021-03-05 MED ORDER — MORPHINE SULFATE (PF) 4 MG/ML IV SOLN
4.0000 mg | Freq: Once | INTRAVENOUS | Status: AC
  Administered 2021-03-05: 4 mg via INTRAVENOUS
  Filled 2021-03-05: qty 1

## 2021-03-05 MED ORDER — FENTANYL CITRATE PF 50 MCG/ML IJ SOSY
100.0000 ug | PREFILLED_SYRINGE | Freq: Once | INTRAMUSCULAR | Status: AC
  Administered 2021-03-05: 100 ug via INTRAVENOUS
  Filled 2021-03-05: qty 2

## 2021-03-05 MED ORDER — OXYCODONE-ACETAMINOPHEN 5-325 MG PO TABS: 1.0000 | ORAL_TABLET | Freq: Four times a day (QID) | ORAL | 0 refills | Status: DC | PRN

## 2021-03-05 MED ORDER — IOHEXOL 300 MG/ML  SOLN
100.0000 mL | Freq: Once | INTRAMUSCULAR | Status: AC | PRN
  Administered 2021-03-05: 100 mL via INTRAVENOUS

## 2021-03-05 MED ORDER — SODIUM CHLORIDE 0.9 % IV BOLUS
1000.0000 mL | Freq: Once | INTRAVENOUS | Status: AC
  Administered 2021-03-05: 1000 mL via INTRAVENOUS
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
## 2021-03-05 NOTE — ED Notes (Signed)
PAs at bedside.

## 2021-03-05 NOTE — Care Management (Signed)
Patient has no insurance no PCP, Recommended PCP in patient instructions.
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
## 2021-03-05 NOTE — ED Notes (Signed)
Dressed the wound on the left groin with wet to dry dressing
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
## 2021-03-05 NOTE — ED Notes (Signed)
Ortho in to place splint

## 2021-03-05 NOTE — ED Notes (Signed)
Pt came back from xray when ct tech came to take pt. Pt became upset and started cussing. Pt refused to go at this time. Pt reports 10/10 pain scale to left leg. Pt observed falling asleep on bed but continues to endorse 10/10 pain scale. Pt refused to cooperate at this time. Providers notified. Came in and talked to pt. Pt now agrees to go to CT. Ct notified.

## 2021-03-05 NOTE — Discharge Instructions (Addendum)
Please follow-up with Ortho for continuation of care and reevaluation of left ankle fracture.  You have been prescribed Keflex, Zofran, and Percocet.  Please finish entire course as directed on bottle for Keflex.  Zofran for nausea and vomiting as needed.  Percocet as needed for pain.  Attached is additional information on bimalleolar ankle fractures and a referral to Ortho and a PCP. Please return to the ED with any new or worsening symptoms.

## 2021-03-05 NOTE — Progress Notes (Signed)
Orthopedic Tech Progress Note Patient Details:  George Summers 11/11/1980 [id number]  Ortho Devices Type of Ortho Device: Crutches, Post (short leg) splint, Stirrup splint Ortho Device/Splint Location: lle Ortho Device/Splint Interventions: Ordered, Application, Adjustment  After applying the splint and explaining to the patient he could not walk on the splint and that it would be unsafe to do so and that he could make the fracture worse he made it clear that he would find a way. I told the RN and Dr. Arlie Solomons Interventions Patient Tolerated: Well Instructions Provided: Care of device, Adjustment of device  [name] 03/05/2021, 10:11 PM

## 2021-03-05 NOTE — ED Notes (Signed)
Taken to CT at this time. 

## 2021-03-14 ENCOUNTER — Emergency Department (HOSPITAL_COMMUNITY): Payer: Self-pay

## 2021-03-14 ENCOUNTER — Other Ambulatory Visit: Payer: Self-pay

## 2021-03-14 ENCOUNTER — Inpatient Hospital Stay (HOSPITAL_COMMUNITY)
Admission: EM | Admit: 2021-03-14 | Discharge: 2021-03-20 | DRG: 603 | Discharge status: Against Medical Advice | Payer: Self-pay | Attending: Internal Medicine | Admitting: Internal Medicine

## 2021-03-14 DIAGNOSIS — L0390 Cellulitis, unspecified: Secondary | ICD-10-CM | POA: Diagnosis present

## 2021-03-14 DIAGNOSIS — Z825 Family history of asthma and other chronic lower respiratory diseases: Secondary | ICD-10-CM

## 2021-03-14 DIAGNOSIS — Z8661 Personal history of infections of the central nervous system: Secondary | ICD-10-CM

## 2021-03-14 DIAGNOSIS — Z5329 Procedure and treatment not carried out because of patient's decision for other reasons: Secondary | ICD-10-CM | POA: Diagnosis not present

## 2021-03-14 DIAGNOSIS — L089 Local infection of the skin and subcutaneous tissue, unspecified: Secondary | ICD-10-CM

## 2021-03-14 DIAGNOSIS — R7881 Bacteremia: Secondary | ICD-10-CM | POA: Diagnosis present

## 2021-03-14 DIAGNOSIS — L03116 Cellulitis of left lower limb: Principal | ICD-10-CM | POA: Diagnosis present

## 2021-03-14 DIAGNOSIS — F17210 Nicotine dependence, cigarettes, uncomplicated: Secondary | ICD-10-CM | POA: Diagnosis present

## 2021-03-14 DIAGNOSIS — F1110 Opioid abuse, uncomplicated: Secondary | ICD-10-CM | POA: Diagnosis present

## 2021-03-14 DIAGNOSIS — S82832D Other fracture of upper and lower end of left fibula, subsequent encounter for closed fracture with routine healing: Secondary | ICD-10-CM

## 2021-03-14 DIAGNOSIS — S71112D Laceration without foreign body, left thigh, subsequent encounter: Secondary | ICD-10-CM

## 2021-03-14 DIAGNOSIS — B9562 Methicillin resistant Staphylococcus aureus infection as the cause of diseases classified elsewhere: Secondary | ICD-10-CM | POA: Diagnosis present

## 2021-03-14 DIAGNOSIS — B9561 Methicillin susceptible Staphylococcus aureus infection as the cause of diseases classified elsewhere: Secondary | ICD-10-CM | POA: Diagnosis present

## 2021-03-14 DIAGNOSIS — B1920 Unspecified viral hepatitis C without hepatic coma: Secondary | ICD-10-CM | POA: Diagnosis present

## 2021-03-14 DIAGNOSIS — S8255XD Nondisplaced fracture of medial malleolus of left tibia, subsequent encounter for closed fracture with routine healing: Secondary | ICD-10-CM

## 2021-03-14 DIAGNOSIS — Z833 Family history of diabetes mellitus: Secondary | ICD-10-CM

## 2021-03-14 DIAGNOSIS — Z1629 Resistance to other single specified antibiotic: Secondary | ICD-10-CM | POA: Diagnosis present

## 2021-03-14 DIAGNOSIS — Z8619 Personal history of other infectious and parasitic diseases: Secondary | ICD-10-CM

## 2021-03-14 DIAGNOSIS — Z20822 Contact with and (suspected) exposure to covid-19: Secondary | ICD-10-CM | POA: Diagnosis present

## 2021-03-14 DIAGNOSIS — S81802A Unspecified open wound, left lower leg, initial encounter: Secondary | ICD-10-CM | POA: Diagnosis present

## 2021-03-14 LAB — COMPREHENSIVE METABOLIC PANEL
ALT: 136 U/L — ABNORMAL HIGH (ref 0–44)
AST: 127 U/L — ABNORMAL HIGH (ref 15–41)
Albumin: 3.5 g/dL (ref 3.5–5.0)
Alkaline Phosphatase: 77 U/L (ref 38–126)
Anion gap: 7 (ref 5–15)
BUN: 27 mg/dL — ABNORMAL HIGH (ref 6–20)
CO2: 32 mmol/L (ref 22–32)
Calcium: 9.9 mg/dL (ref 8.9–10.3)
Chloride: 98 mmol/L (ref 98–111)
Creatinine, Ser: 0.65 mg/dL (ref 0.61–1.24)
GFR, Estimated: >60 mL/min (ref >60)
Glucose, Bld: 102 mg/dL — ABNORMAL HIGH (ref 70–99)
Potassium: 5 mmol/L (ref 3.5–5.1)
Sodium: 137 mmol/L (ref 135–145)
Total Bilirubin: 0.6 mg/dL (ref 0.3–1.2)
Total Protein: 6.8 g/dL (ref 6.5–8.1)

## 2021-03-14 LAB — CBC WITH DIFFERENTIAL/PLATELET
Abs Immature Granulocytes: 0.1 10*3/uL — ABNORMAL HIGH (ref 0.00–0.07)
Basophils Absolute: 0.1 10*3/uL (ref 0.0–0.1)
Basophils Relative: 1 %
Eosinophils Absolute: 0.4 10*3/uL (ref 0.0–0.5)
Eosinophils Relative: 3 %
HCT: 40.2 % (ref 39.0–52.0)
Hemoglobin: 13.3 g/dL (ref 13.0–17.0)
Immature Granulocytes: 1 %
Lymphocytes Relative: 16 %
Lymphs Abs: 2.1 10*3/uL (ref 0.7–4.0)
MCH: 31.9 pg (ref 26.0–34.0)
MCHC: 33.1 g/dL (ref 30.0–36.0)
MCV: 96.4 fL (ref 80.0–100.0)
Monocytes Absolute: 1 10*3/uL (ref 0.1–1.0)
Monocytes Relative: 7 %
Neutro Abs: 9.6 10*3/uL — ABNORMAL HIGH (ref 1.7–7.7)
Neutrophils Relative %: 72 %
Platelets: 402 10*3/uL — ABNORMAL HIGH (ref 150–400)
RBC: 4.17 MIL/uL — ABNORMAL LOW (ref 4.22–5.81)
RDW: 15 % (ref 11.5–15.5)
WBC: 13.3 10*3/uL — ABNORMAL HIGH (ref 4.0–10.5)
nRBC: 0 % (ref 0.0–0.2)
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
## 2021-03-14 MED ORDER — LACTATED RINGERS IV BOLUS
1000.0000 mL | Freq: Once | INTRAVENOUS | Status: AC
  Administered 2021-03-14: 1000 mL via INTRAVENOUS

## 2021-03-14 MED ORDER — VANCOMYCIN HCL 1500 MG/300ML IV SOLN
1500.0000 mg | Freq: Once | INTRAVENOUS | Status: DC
  Filled 2021-03-14: qty 300

## 2021-03-14 MED ORDER — CLINDAMYCIN PHOSPHATE 600 MG/50ML IV SOLN
600.0000 mg | Freq: Once | INTRAVENOUS | Status: DC
  Filled 2021-03-14: qty 50

## 2021-03-14 MED ORDER — PIPERACILLIN-TAZOBACTAM 3.375 G IVPB 30 MIN
3.3750 g | Freq: Once | INTRAVENOUS | Status: AC
  Administered 2021-03-14: 3.375 g via INTRAVENOUS
  Filled 2021-03-14: qty 50
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
## 2021-03-14 NOTE — ED Notes (Signed)
Pt transported to CT ?
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
## 2021-03-15 ENCOUNTER — Inpatient Hospital Stay (HOSPITAL_COMMUNITY): Payer: Self-pay

## 2021-03-15 DIAGNOSIS — L0390 Cellulitis, unspecified: Secondary | ICD-10-CM | POA: Diagnosis present

## 2021-03-15 DIAGNOSIS — L03116 Cellulitis of left lower limb: Principal | ICD-10-CM

## 2021-03-15 DIAGNOSIS — S81802A Unspecified open wound, left lower leg, initial encounter: Secondary | ICD-10-CM | POA: Diagnosis present

## 2021-03-15 LAB — TROPONIN I (HIGH SENSITIVITY): Troponin I (High Sensitivity): 4 ng/L (ref <18)

## 2021-03-15 LAB — COMPREHENSIVE METABOLIC PANEL
ALT: 111 U/L — ABNORMAL HIGH (ref 0–44)
AST: 98 U/L — ABNORMAL HIGH (ref 15–41)
Albumin: 3 g/dL — ABNORMAL LOW (ref 3.5–5.0)
Alkaline Phosphatase: 72 U/L (ref 38–126)
Anion gap: 7 (ref 5–15)
BUN: 20 mg/dL (ref 6–20)
CO2: 30 mmol/L (ref 22–32)
Calcium: 9.1 mg/dL (ref 8.9–10.3)
Chloride: 99 mmol/L (ref 98–111)
Creatinine, Ser: 0.68 mg/dL (ref 0.61–1.24)
GFR, Estimated: >60 mL/min (ref >60)
Glucose, Bld: 108 mg/dL — ABNORMAL HIGH (ref 70–99)
Potassium: 3.6 mmol/L (ref 3.5–5.1)
Sodium: 136 mmol/L (ref 135–145)
Total Bilirubin: 0.5 mg/dL (ref 0.3–1.2)
Total Protein: 5.8 g/dL — ABNORMAL LOW (ref 6.5–8.1)

## 2021-03-15 LAB — CBC
HCT: 36.6 % — ABNORMAL LOW (ref 39.0–52.0)
Hemoglobin: 12.2 g/dL — ABNORMAL LOW (ref 13.0–17.0)
MCH: 31.5 pg (ref 26.0–34.0)
MCHC: 33.3 g/dL (ref 30.0–36.0)
MCV: 94.6 fL (ref 80.0–100.0)
Platelets: 333 10*3/uL (ref 150–400)
RBC: 3.87 MIL/uL — ABNORMAL LOW (ref 4.22–5.81)
RDW: 15 % (ref 11.5–15.5)
WBC: 11.3 10*3/uL — ABNORMAL HIGH (ref 4.0–10.5)
nRBC: 0 % (ref 0.0–0.2)

## 2021-03-15 LAB — HEMOGLOBIN A1C
Hgb A1c MFr Bld: 5.3 % (ref 4.8–5.6)
Mean Plasma Glucose: 105.41 mg/dL

## 2021-03-15 LAB — LACTIC ACID, PLASMA: Lactic Acid, Venous: 0.9 mmol/L (ref 0.5–1.9)

## 2021-03-15 LAB — CBG MONITORING, ED: Glucose-Capillary: 113 mg/dL — ABNORMAL HIGH (ref 70–99)

## 2021-03-15 LAB — SARS CORONAVIRUS 2 (TAT 6-24 HRS): SARS Coronavirus 2: NEGATIVE

## 2021-03-15 MED ORDER — GABAPENTIN 300 MG PO CAPS
300.0000 mg | ORAL_CAPSULE | Freq: Three times a day (TID) | ORAL | Status: DC
  Administered 2021-03-15 – 2021-03-20 (×15): 300 mg via ORAL
  Filled 2021-03-15 (×15): qty 1

## 2021-03-15 MED ORDER — VANCOMYCIN HCL 1250 MG/250ML IV SOLN
1250.0000 mg | Freq: Two times a day (BID) | INTRAVENOUS | Status: DC
  Administered 2021-03-15 – 2021-03-17 (×5): 1250 mg via INTRAVENOUS
  Filled 2021-03-15 (×6): qty 250

## 2021-03-15 MED ORDER — OXYCODONE HCL 5 MG PO TABS
5.0000 mg | ORAL_TABLET | ORAL | Status: DC | PRN
  Administered 2021-03-15 (×2): 5 mg via ORAL
  Filled 2021-03-15 (×2): qty 1

## 2021-03-15 MED ORDER — ENOXAPARIN SODIUM 40 MG/0.4ML IJ SOSY
40.0000 mg | PREFILLED_SYRINGE | Freq: Every day | INTRAMUSCULAR | Status: DC
  Administered 2021-03-15: 40 mg via SUBCUTANEOUS
  Filled 2021-03-15 (×4): qty 0.4

## 2021-03-15 MED ORDER — PANTOPRAZOLE SODIUM 40 MG IV SOLR
40.0000 mg | INTRAVENOUS | Status: DC
Start: 2021-03-15 — End: 2021-03-16
  Administered 2021-03-15: 40 mg via INTRAVENOUS
  Filled 2021-03-15: qty 40

## 2021-03-15 MED ORDER — HYDROMORPHONE HCL 1 MG/ML IJ SOLN
1.0000 mg | Freq: Once | INTRAMUSCULAR | Status: AC
  Administered 2021-03-15: 1 mg via INTRAVENOUS
  Filled 2021-03-15: qty 1

## 2021-03-15 MED ORDER — SODIUM CHLORIDE 0.9 % IV SOLN
25.0000 mg | Freq: Four times a day (QID) | INTRAVENOUS | Status: DC | PRN
  Administered 2021-03-15: 25 mg via INTRAVENOUS
  Filled 2021-03-15: qty 1

## 2021-03-15 MED ORDER — IOHEXOL 350 MG/ML SOLN
75.0000 mL | Freq: Once | INTRAVENOUS | Status: AC | PRN
  Administered 2021-03-15: 75 mL via INTRAVENOUS

## 2021-03-15 MED ORDER — HYDROMORPHONE HCL 1 MG/ML IJ SOLN
1.0000 mg | INTRAMUSCULAR | Status: DC | PRN
  Administered 2021-03-15 – 2021-03-18 (×6): 1 mg via INTRAVENOUS
  Filled 2021-03-15 (×7): qty 1

## 2021-03-15 MED ORDER — CYCLOBENZAPRINE HCL 5 MG PO TABS
5.0000 mg | ORAL_TABLET | Freq: Three times a day (TID) | ORAL | Status: DC
  Administered 2021-03-15 – 2021-03-20 (×15): 5 mg via ORAL
  Filled 2021-03-15 (×15): qty 1

## 2021-03-15 MED ORDER — KETOROLAC TROMETHAMINE 30 MG/ML IJ SOLN
30.0000 mg | Freq: Three times a day (TID) | INTRAMUSCULAR | Status: DC
  Administered 2021-03-15 – 2021-03-16 (×4): 30 mg via INTRAVENOUS
  Filled 2021-03-15 (×6): qty 1

## 2021-03-15 MED ORDER — SODIUM CHLORIDE 0.9 % IV SOLN
2.0000 g | Freq: Three times a day (TID) | INTRAVENOUS | Status: DC
  Administered 2021-03-15 – 2021-03-17 (×7): 2 g via INTRAVENOUS
  Filled 2021-03-15 (×8): qty 2

## 2021-03-15 MED ORDER — OXYCODONE HCL 5 MG PO TABS
10.0000 mg | ORAL_TABLET | ORAL | Status: DC | PRN
Start: 2021-03-15 — End: 2021-03-20
  Administered 2021-03-15 – 2021-03-20 (×24): 10 mg via ORAL
  Filled 2021-03-15 (×24): qty 2

## 2021-03-15 MED ORDER — OXYCODONE HCL 5 MG PO TABS
5.0000 mg | ORAL_TABLET | Freq: Once | ORAL | Status: AC
  Administered 2021-03-15: 5 mg via ORAL
  Filled 2021-03-15: qty 1

## 2021-03-15 MED ORDER — NICOTINE 7 MG/24HR TD PT24
7.0000 mg | MEDICATED_PATCH | Freq: Every day | TRANSDERMAL | Status: DC | PRN
  Administered 2021-03-15 – 2021-03-16 (×2): 7 mg via TRANSDERMAL
  Filled 2021-03-15 (×2): qty 1

## 2021-03-15 NOTE — ED Notes (Signed)
Phenergan paused for pt to go to MRI

## 2021-03-15 NOTE — ED Notes (Signed)
Admitting at bedside 

## 2021-03-15 NOTE — ED Notes (Addendum)
Pt back from MRI. IV flushed, noted a little blood under dressing, no swelling or erythema present. Started pt's phenergan back and pt reported burning at the site. Phenergan stopped again. IV discontinued and warm compressed placed
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
## 2021-03-15 NOTE — Progress Notes (Signed)
Pt arrived to [address] room 27 alert and oriented. Vanc already running by previous nurse. Bed in lowest position, call light in reach.
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
## 2021-03-15 NOTE — ED Notes (Signed)
Pt provided with turkey sandwich and water
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
## 2021-03-15 NOTE — ED Notes (Signed)
Pt report to tech, that pt is in pain. RN went into to ask about pain medication. Pt sleeping. RN to reassess upon waking.

## 2021-03-16 ENCOUNTER — Other Ambulatory Visit (HOSPITAL_COMMUNITY): Payer: Medicaid Other

## 2021-03-16 ENCOUNTER — Inpatient Hospital Stay (HOSPITAL_COMMUNITY): Payer: Self-pay

## 2021-03-16 LAB — COMPREHENSIVE METABOLIC PANEL
ALT: 108 U/L — ABNORMAL HIGH (ref 0–44)
AST: 80 U/L — ABNORMAL HIGH (ref 15–41)
Albumin: 3.1 g/dL — ABNORMAL LOW (ref 3.5–5.0)
Alkaline Phosphatase: 75 U/L (ref 38–126)
Anion gap: 6 (ref 5–15)
BUN: 16 mg/dL (ref 6–20)
CO2: 27 mmol/L (ref 22–32)
Calcium: 9.4 mg/dL (ref 8.9–10.3)
Chloride: 103 mmol/L (ref 98–111)
Creatinine, Ser: 0.7 mg/dL (ref 0.61–1.24)
GFR, Estimated: >60 mL/min (ref >60)
Glucose, Bld: 87 mg/dL (ref 70–99)
Potassium: 4.2 mmol/L (ref 3.5–5.1)
Sodium: 136 mmol/L (ref 135–145)
Total Bilirubin: 0.7 mg/dL (ref 0.3–1.2)
Total Protein: 6 g/dL — ABNORMAL LOW (ref 6.5–8.1)

## 2021-03-16 LAB — CBC
HCT: 36.7 % — ABNORMAL LOW (ref 39.0–52.0)
Hemoglobin: 12.1 g/dL — ABNORMAL LOW (ref 13.0–17.0)
MCH: 31.3 pg (ref 26.0–34.0)
MCHC: 33 g/dL (ref 30.0–36.0)
MCV: 95.1 fL (ref 80.0–100.0)
Platelets: 331 10*3/uL (ref 150–400)
RBC: 3.86 MIL/uL — ABNORMAL LOW (ref 4.22–5.81)
RDW: 15 % (ref 11.5–15.5)
WBC: 11.3 10*3/uL — ABNORMAL HIGH (ref 4.0–10.5)
nRBC: 0 % (ref 0.0–0.2)

## 2021-03-16 LAB — HCV RNA QUANT RFLX ULTRA OR GENOTYP
HCV RNA Qnt(log copy/mL): 5.974 log10 IU/mL
HepC Qn: 941000 IU/mL

## 2021-03-16 LAB — HEPATITIS C GENOTYPE: Hepatitis C Genotype: 3

## 2021-03-16 MED ORDER — LORAZEPAM 0.5 MG PO TABS: 0.5000 mg | ORAL_TABLET | Freq: Once | ORAL | Status: DC | PRN

## 2021-03-16 MED ORDER — PANTOPRAZOLE SODIUM 40 MG PO TBEC
40.0000 mg | DELAYED_RELEASE_TABLET | Freq: Every day | ORAL | Status: AC
  Administered 2021-03-16 – 2021-03-19 (×4): 40 mg via ORAL
  Filled 2021-03-16 (×4): qty 1

## 2021-03-16 MED ORDER — NALOXONE HCL 0.4 MG/ML IJ SOLN
0.4000 mg | INTRAMUSCULAR | Status: DC | PRN
  Filled 2021-03-16: qty 1

## 2021-03-16 MED ORDER — LORAZEPAM 2 MG/ML IJ SOLN
0.5000 mg | Freq: Once | INTRAMUSCULAR | Status: AC
  Administered 2021-03-18: 0.5 mg via INTRAVENOUS
  Filled 2021-03-16: qty 1

## 2021-03-16 MED ORDER — OXYCODONE HCL ER 15 MG PO T12A
15.0000 mg | EXTENDED_RELEASE_TABLET | Freq: Two times a day (BID) | ORAL | Status: DC
  Administered 2021-03-16 – 2021-03-18 (×3): 15 mg via ORAL
  Filled 2021-03-16 (×4): qty 1

## 2021-03-16 NOTE — Progress Notes (Signed)
   03/16/21 1632  Assess: MEWS Score  Temp 99.3 F (37.4 C)  BP (!) 143/72  Pulse Rate (!) 132  Resp (!) 23  SpO2 98 %  O2 Device Room Air  Assess: MEWS Score  MEWS Temp 0  MEWS Systolic 0  MEWS Pulse 3  MEWS RR 1  MEWS LOC 0  MEWS Score 4  MEWS Score Color Red  Assess: if the MEWS score is Yellow or Red  Were vital signs taken at a resting state? Yes  Focused Assessment Change from prior assessment (see assessment flowsheet)  Early Detection of Sepsis Score *See Row Information* Low  Treat  MEWS Interventions Administered prn meds/treatments  Notify: Charge Nurse/RN  Name of Charge Nurse/RN Notified Park Meo, RN/CN  Date Charge Nurse/RN Notified 03/16/21  Time Charge Nurse/RN Notified 1639  Notify: Provider  Provider Name/Title Champ Mungo, DO  Date Provider Notified 03/16/21  Time Provider Notified 1639  Notification Type Page  Notification Reason Change in status  Provider response Other (Comment) (coming to bedside)  Date of Provider Response 03/16/21  Time of Provider Response 1853  Document  Progress note created (see row info) Yes

## 2021-03-16 NOTE — Plan of Care (Signed)
  Problem: Education: Goal: Knowledge of General Education information will improve Description: Including pain rating scale, medication(s)/side effects and non-pharmacologic comfort measures Outcome: Progressing   Problem: Activity: Goal: Risk for activity intolerance will decrease Outcome: Progressing   

## 2021-03-16 NOTE — Progress Notes (Signed)
HD#1 SUBJECTIVE:  Patient Summary: George Summers is a 40 y.o. 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  Overnight Events: None  Interim History: Patient reports that overall he feels much better, is surprised by how quickly it has happened with starting antibiotics and having pain care on board.  I did ask him if he went down for MRI yesterday and he stated that during the scan, he expressed feeling overheated and he was taken back to his ED area and he felt that the person transporting him was rude, stating that he would not go down for MRI today if it was the same person.  Nurse at bedside assured him that she would go down with him today.  I also assured the patient that I would be happy to provide medication for any anxiety or nerves regarding the scan.  The team did receive a page around 1300 stating that the patient was refusing transport to MRI as a friend was coming to see him and he did not want to miss the friend.  I did explain to him this morning that though he may feel better and to be clinically improving, it is still very important that we complete all the necessary tests to work-up this infection.  OBJECTIVE:  Vital Signs: Vitals:   03/15/21 2053 03/15/21 2337 03/16/21 0308 03/16/21 0812  BP: (!) 110/56 121/71 117/77 120/78  Pulse: (!) 104 93 (!) 102 84  Resp:   18 20  Temp: 98.4 F (36.9 C) 97.8 F (36.6 C) 98.1 F (36.7 C) 98 F (36.7 C)  TempSrc: Oral Oral Oral Oral  SpO2: 100% 95% 99% 99%   Supplemental O2: Room Air SpO2: 99 %  There were no vitals filed for this visit.   Intake/Output Summary (Last 24 hours) at 03/16/2021 0857 Last data filed at 03/16/2021 0836 Gross per 24 hour  Intake 880 ml   Output 2 ml  Net 878 ml   Net IO Since Admission: 2,118.62 mL [03/16/21 0857]  Physical Exam: Constitutional: Thin gentleman resting in bed.  No acute distress noted. Cardio: Regular rate and rhythm.  No murmurs, rubs, gallops. Pulm: Clear to auscultation bilaterally.  Normal work of breathing on room air. Abdomen: Soft, nontender, nondistended. MSK: Lower left extremity with large scabs surrounded by erythema, though this is improved from previous physical exam on 9/27.  Compared to right lower extremity and remainder of left lower extremity, there is increased warmth and erythema.  Left lower extremity also has edema.  Patient states that this knee is also tender.  Open wound to the groin area continues to have serosanguineous, purulent drainage though has less surrounding erythema and warmth as compared to physical exam on 9/27. Skin: Skin is warm and dry. Neuro: Alert and oriented x3.  No focal deficit noted. Psych: Appropriate mood and affect.  Patient Lines/Drains/Airways Status     Active Line/Drains/Airways     Name Placement date Placement time Site Days   Peripheral IV 03/15/21 20 G 1" Right Antecubital 03/15/21  1556  Antecubital  1   Incision (Closed) 07/30/20 Back Mid;Lower 07/30/20  1237  -- 229   Wound / Incision (Open or Dehisced) 03/15/21 Groin Anterior;Left;Proximal 03/15/21  1941  Groin  1   Wound / Incision (Open or Dehisced)  03/15/21 Pretibial Left 03/15/21  1942  Pretibial  1             ASSESSMENT/PLAN:  Assessment: Principal Problem:   Open wound of left lower extremity Active Problems:   MSSA bacteremia   Cellulitis   Plan: #Wound infection Leukocytosis unchanged at 11.3.  He remains hemodynamically stable, afebrile.  Clinically, the wounds are improving and patient reports feeling better already. -Blood cultures show no growth to date -Continue vancomycin 1250 mg twice daily, cefepime 2 g IV 3 times daily -Consider transitioning cefepime to  Zosyn in a.m. -Wound care consult -Wound culture ordered for groin wound             -Aerobic culture for groin wound shows no growth to date.  Will add on anaerobic testing as well. 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 At this time he was found to have epidural spinal abscess.  He denies back pain on physical exam today.  Blood culture shows no growth to date. -Continue IV antibiotics as above -MRI thoracic, lumbar spine pending.  Ativan ordered for anxiolytic for this scan.  Attempt was made to take patient down for imaging however he refused, stating that he had a friend coming to see him and did not want to miss the friend.  An additional attempt will be made at a later time. -Echo to evaluate for valvular vegetations -Trend CBC   #Nondisplaced fractures of anteromedial distal tibia, medial malleolus, distal fibula CT showed nondisplaced fractures unchanged from prior imaging 9/17. -Oxycodone 5 mg every 4 hours as needed.  Consider transitioning to Percocet in a.m. -Given history of opioid use, permissive to give increased doses as needed for severe pain   #Polysubstance use disorder #History of alcohol withdrawal seizures #History of opioid overdose Patient does endorse using heroin for pain control as recently as of Sunday.  He does have concerns that he is beginning to go into withdrawal however he feels that thus far he has been well controlled. -CIWA, COWS -Gabapentin 300 mg p.o. 3 times daily -Toradol 30 mg IV 3 times daily -Oxycodone 10 mg p.o. every 4 hours -Dilaudid 1 mg IV every 3 hours as needed breakthrough pain not controlled by p.o. pain management -Flexeril 5 mg  p.o. 3 times daily -Phenergan 25 mg IV every 6 hours PRN -Narcan as needed added to medical management   #Hepatitis C positive #Transaminitis Hepatic function improving with AST 80, ALT 108, normal alk phos and T bili.  Patient found to have HCV RNA of 43,800,000 in February 2022 however it is unclear if he has ever received treatment for this. -Follow-up HCV RNA quant  Best Practice: Diet: Regular diet IVF: Fluids: None VTE: enoxaparin (LOVENOX) injection 40 mg Start: 03/15/21 0030 Code: Full AB: Vancomycin 1250 mg twice daily, cefepime 2 g IV 3 times daily DISPO: Anticipated discharge to Home pending Medical stability, IV antibiotics, and Wound care.  Signature: Farrel Gordon, D.O.  Internal Medicine Resident, PGY-1 [facility]  Pager: [phone] 8:57 AM, 03/16/2021   Please contact the on call pager after 5 pm and on weekends at [phone].

## 2021-03-16 NOTE — Progress Notes (Signed)
MEWS turned to RED. Paged MD. MD on way to unit.  CIWA score 15 COWS score 18

## 2021-03-16 NOTE — Progress Notes (Signed)
Attempted to bring pt down for MRI. Pt refusing exam at this time. Will f/u at a later time and/or date.

## 2021-03-16 NOTE — Progress Notes (Signed)
Administered ketorolac IV, pt complained that it made all of his joints hurt instantly. He does not want anymore of that. He only wants dilaudid. Pt also stated earlier that dilaudid does nothing for the pain, doesn't know why he's taking it since it doesn't work anymore.

## 2021-03-16 NOTE — Progress Notes (Signed)
RN had meds for MRI. When transport came, pt refused to go bc he has a friend on the way to visit him and he doesn't want to miss the friend.  RN notified MRI and MD.  RN asked for order for narcan PRN since pt is on oxycodone.

## 2021-03-16 NOTE — Progress Notes (Signed)
   03/16/21 1729  Assess: MEWS Score  Temp 98.5 F (36.9 C)  BP 114/67  Pulse Rate (!) 145  Resp 20  SpO2 96 %  O2 Device Room Air  Assess: MEWS Score  MEWS Temp 0  MEWS Systolic 0  MEWS Pulse 3  MEWS RR 0  MEWS LOC 0  MEWS Score 3  MEWS Score Color Yellow  Assess: if the MEWS score is Yellow or Red  Were vital signs taken at a resting state? Yes  Focused Assessment Change from prior assessment (see assessment flowsheet)  Early Detection of Sepsis Score *See Row Information* Low  MEWS guidelines implemented *See Row Information* Yes  Escalate  MEWS: Escalate Yellow: discuss with charge nurse/RN and consider discussing with provider and RRT  Document  Progress note created (see row info) Yes

## 2021-03-17 ENCOUNTER — Inpatient Hospital Stay (HOSPITAL_COMMUNITY): Payer: Self-pay

## 2021-03-17 DIAGNOSIS — I38 Endocarditis, valve unspecified: Secondary | ICD-10-CM

## 2021-03-17 LAB — CBC
HCT: 40.4 % (ref 39.0–52.0)
Hemoglobin: 13.7 g/dL (ref 13.0–17.0)
MCH: 31.6 pg (ref 26.0–34.0)
MCHC: 33.9 g/dL (ref 30.0–36.0)
MCV: 93.3 fL (ref 80.0–100.0)
Platelets: 319 10*3/uL (ref 150–400)
RBC: 4.33 MIL/uL (ref 4.22–5.81)
RDW: 15 % (ref 11.5–15.5)
WBC: 26.5 10*3/uL — ABNORMAL HIGH (ref 4.0–10.5)
nRBC: 0 % (ref 0.0–0.2)

## 2021-03-17 LAB — ECHOCARDIOGRAM COMPLETE
AR max vel: 3.08 cm2
AV Area VTI: 2.71 cm2
AV Area mean vel: 2.88 cm2
AV Mean grad: 5 mmHg
AV Peak grad: 8.2 mmHg
Ao pk vel: 1.43 m/s

## 2021-03-17 LAB — COMPREHENSIVE METABOLIC PANEL
ALT: 114 U/L — ABNORMAL HIGH (ref 0–44)
AST: 109 U/L — ABNORMAL HIGH (ref 15–41)
Albumin: 3.7 g/dL (ref 3.5–5.0)
Alkaline Phosphatase: 79 U/L (ref 38–126)
Anion gap: 10 (ref 5–15)
BUN: 11 mg/dL (ref 6–20)
CO2: 27 mmol/L (ref 22–32)
Calcium: 9.9 mg/dL (ref 8.9–10.3)
Chloride: 100 mmol/L (ref 98–111)
Creatinine, Ser: 0.71 mg/dL (ref 0.61–1.24)
GFR, Estimated: >60 mL/min (ref >60)
Glucose, Bld: 138 mg/dL — ABNORMAL HIGH (ref 70–99)
Potassium: 4.3 mmol/L (ref 3.5–5.1)
Sodium: 137 mmol/L (ref 135–145)
Total Bilirubin: 0.7 mg/dL (ref 0.3–1.2)
Total Protein: 7.4 g/dL (ref 6.5–8.1)

## 2021-03-17 LAB — GLUCOSE, CAPILLARY: Glucose-Capillary: 121 mg/dL — ABNORMAL HIGH (ref 70–99)

## 2021-03-17 MED ORDER — CEFADROXIL 500 MG PO CAPS
500.0000 mg | ORAL_CAPSULE | Freq: Two times a day (BID) | ORAL | Status: DC
  Filled 2021-03-17: qty 1

## 2021-03-17 MED ORDER — SULFAMETHOXAZOLE-TRIMETHOPRIM 800-160 MG PO TABS
1.0000 | ORAL_TABLET | Freq: Two times a day (BID) | ORAL | Status: DC
  Administered 2021-03-17 – 2021-03-18 (×3): 1 via ORAL
  Filled 2021-03-17 (×3): qty 1

## 2021-03-17 MED ORDER — MUPIROCIN 2 % EX OINT
TOPICAL_OINTMENT | Freq: Two times a day (BID) | CUTANEOUS | Status: DC
  Administered 2021-03-17: 1 via TOPICAL
  Filled 2021-03-17 (×3): qty 22

## 2021-03-17 MED ORDER — ACETAMINOPHEN 325 MG PO TABS
650.0000 mg | ORAL_TABLET | Freq: Four times a day (QID) | ORAL | Status: DC | PRN
  Administered 2021-03-17: 650 mg via ORAL
  Filled 2021-03-17: qty 2

## 2021-03-17 NOTE — Progress Notes (Signed)
Dressing change. Pt tolerated well.
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
## 2021-03-17 NOTE — Progress Notes (Signed)
   03/17/21 1225  Assess: MEWS Score  Temp 98.5 F (36.9 C)  BP 117/88  Pulse Rate (!) 137  Resp 16  SpO2 100 %  Assess: MEWS Score  MEWS Temp 0  MEWS Systolic 0  MEWS Pulse 3  MEWS RR 0  MEWS LOC 0  MEWS Score 3  MEWS Score Color Yellow  Assess: if the MEWS score is Yellow or Red  Were vital signs taken at a resting state? Yes  Focused Assessment No change from prior assessment  Early Detection of Sepsis Score *See Row Information* Low  MEWS guidelines implemented *See Row Information* No, previously yellow, continue vital signs every 4 hours

## 2021-03-17 NOTE — Progress Notes (Signed)
   03/17/21 1739  Assess: MEWS Score  Temp (!) 97.5 F (36.4 C)  BP (!) 115/95  Pulse Rate (!) 121  Resp 20  SpO2 100 %  O2 Device Room Air  Assess: MEWS Score  MEWS Temp 0  MEWS Systolic 0  MEWS Pulse 2  MEWS RR 0  MEWS LOC 0  MEWS Score 2  MEWS Score Color Yellow  Assess: if the MEWS score is Yellow or Red  Were vital signs taken at a resting state? Yes  Focused Assessment No change from prior assessment  Early Detection of Sepsis Score *See Row Information* Low  MEWS guidelines implemented *See Row Information* No, previously yellow, continue vital signs every 4 hours
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
## 2021-03-17 NOTE — Progress Notes (Signed)
  Echocardiogram 2D Echocardiogram has been performed.  Roosvelt Maser F 03/17/2021, 1:29 PM

## 2021-03-17 NOTE — Progress Notes (Signed)
   03/17/21 0855  Assess: MEWS Score  Temp 98.2 F (36.8 C)  BP 116/86  Pulse Rate (!) 125  SpO2 100 %  O2 Device Room Air  Assess: MEWS Score  MEWS Temp 0  MEWS Systolic 0  MEWS Pulse 2  MEWS RR 0  MEWS LOC 0  MEWS Score 2  MEWS Score Color Yellow  Assess: if the MEWS score is Yellow or Red  Were vital signs taken at a resting state? Yes  Focused Assessment No change from prior assessment  Early Detection of Sepsis Score *See Row Information* Low  MEWS guidelines implemented *See Row Information* No, previously yellow, continue vital signs every 4 hours

## 2021-03-18 LAB — CBC
HCT: 39.3 % (ref 39.0–52.0)
Hemoglobin: 12.9 g/dL — ABNORMAL LOW (ref 13.0–17.0)
MCH: 31.5 pg (ref 26.0–34.0)
MCHC: 32.8 g/dL (ref 30.0–36.0)
MCV: 95.9 fL (ref 80.0–100.0)
Platelets: 318 10*3/uL (ref 150–400)
RBC: 4.1 MIL/uL — ABNORMAL LOW (ref 4.22–5.81)
RDW: 14.9 % (ref 11.5–15.5)
WBC: 19.3 10*3/uL — ABNORMAL HIGH (ref 4.0–10.5)
nRBC: 0 % (ref 0.0–0.2)

## 2021-03-18 LAB — COMPREHENSIVE METABOLIC PANEL
ALT: 113 U/L — ABNORMAL HIGH (ref 0–44)
AST: 74 U/L — ABNORMAL HIGH (ref 15–41)
Albumin: 3.6 g/dL (ref 3.5–5.0)
Alkaline Phosphatase: 72 U/L (ref 38–126)
Anion gap: 9 (ref 5–15)
BUN: 23 mg/dL — ABNORMAL HIGH (ref 6–20)
CO2: 29 mmol/L (ref 22–32)
Calcium: 9.6 mg/dL (ref 8.9–10.3)
Chloride: 98 mmol/L (ref 98–111)
Creatinine, Ser: 1.03 mg/dL (ref 0.61–1.24)
GFR, Estimated: >60 mL/min (ref >60)
Glucose, Bld: 103 mg/dL — ABNORMAL HIGH (ref 70–99)
Potassium: 4 mmol/L (ref 3.5–5.1)
Sodium: 136 mmol/L (ref 135–145)
Total Bilirubin: 0.5 mg/dL (ref 0.3–1.2)
Total Protein: 6.8 g/dL (ref 6.5–8.1)

## 2021-03-18 LAB — GLUCOSE, CAPILLARY: Glucose-Capillary: 115 mg/dL — ABNORMAL HIGH (ref 70–99)

## 2021-03-18 MED ORDER — DOXYCYCLINE HYCLATE 100 MG PO TABS
100.0000 mg | ORAL_TABLET | Freq: Two times a day (BID) | ORAL | Status: DC
  Administered 2021-03-18 – 2021-03-20 (×5): 100 mg via ORAL
  Filled 2021-03-18 (×5): qty 1

## 2021-03-18 MED ORDER — METHADONE HCL 5 MG PO TABS
5.0000 mg | ORAL_TABLET | Freq: Three times a day (TID) | ORAL | Status: DC
Start: 2021-03-18 — End: 2021-03-19
  Administered 2021-03-18 – 2021-03-19 (×4): 5 mg via ORAL
  Filled 2021-03-18 (×4): qty 1

## 2021-03-18 NOTE — Progress Notes (Signed)
HD#3 SUBJECTIVE:  Patient Summary: George Summers is a 40 y.o. 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  Overnight Events: None  Interim History: Patient continues to report feeling better with each day.  He did get in the shower and rip off the scabs from his lower extremity wounds yesterday followed by scrubbing them.  He has been keeping them covered by a moist gauze with wrapping and tape around it.  He states that he is very unlikely to not continue picking the remainder of the scabs off of these wounds.  He also states that he wants to pour peroxide on these wounds despite wound care recommendation to be flushing with sterile solution and using mupirocin cream.  OBJECTIVE:  Vital Signs: Vitals:   03/17/21 2046 03/18/21 0108 03/18/21 0443 03/18/21 0903  BP: 96/71 101/67 107/69 112/77  Pulse: (!) 120 91 (!) 107 (!) 112  Resp: 18 18 16 18   Temp: 98.4 F (36.9 C) (!) 97.5 F (36.4 C) 97.6 F (36.4 C) 98.2 F (36.8 C)  TempSrc: Oral Axillary Oral Oral  SpO2: 97% 96% 97% 99%   Supplemental O2: Room Air SpO2: 99 %  There were no vitals filed for this visit.   Intake/Output Summary (Last 24 hours) at 03/18/2021 1252 Last data filed at 03/17/2021 2246 Gross per 24 hour  Intake 120 ml  Output 0 ml  Net 120 ml   Net IO Since Admission: 3,186.62 mL [03/18/21 1252]  Physical Exam: Constitutional: Patient is ambulating using rolling walker.  No acute distress noted. Cardio: Regular rate and rhythm.  No murmurs, rubs, gallops. Pulm: Clear to auscultation bilaterally.  Normal work of breathing on room air. Abdomen: Soft, nontender, nondistended. MSK: Lower left extremity with open wounds, scab was removed by  patient yesterday.  Skin is beefy red, no bleeding noted.  Left groin wound has less purulent discharge.  Generally, warmth and edema surrounding these wounds is decreased.  The entire left foot is red, edematous however.  No increased warmth to left foot. Skin: Skin is warm and dry with exception of areas above. Neuro: Alert and oriented x3.  No focal deficit noted. Psych: Normal mood and affect.  Patient is speaking rapidly.  Patient Lines/Drains/Airways Status     Active Line/Drains/Airways     Name Placement date Placement time Site Days   Peripheral IV 03/15/21 20 G 1" Right Antecubital 03/15/21  1556  Antecubital  3   Incision (Closed) 07/30/20 Back Mid;Lower 07/30/20  1237  -- 231   Wound / Incision (Open or Dehisced) 03/15/21 Groin Anterior;Left;Proximal 03/15/21  1941  Groin  3   Wound / Incision (Open or Dehisced) 03/15/21 Pretibial Left 03/15/21  1942  Pretibial  3             ASSESSMENT/PLAN:  Assessment: Principal Problem:   Open wound of left lower extremity Active Problems:   MSSA bacteremia   Cellulitis   Plan: #Wound infection Leukocytosis is 19.3.  He remains hemodynamically stable, afebrile.  The patient reports feeling better. -Blood cultures show no growth to date -Wound care consulted, appreciate their assistance             -For left groin wound: Flush the wound with normal saline then apply moistened saline gauze in the wound  and secured with a foam dressing             -For left leg wounds: Clean with soap and water, rinse and pat dry then apply a layer of mupirocin ointment over the entire wound and cover with a nonadherent gauze secured with a few wraps of Kerlix.  Change twice daily. 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  Echocardiogram completed 9/29 showed LVEF 60 to 65%, normal function, no wall motion abnormalities; RV WNL; no tricuspid, mitral valve vegetations. -Trend CBC   #Nondisplaced fractures of anteromedial distal tibia, medial malleolus, distal fibula CT showed nondisplaced fractures unchanged from prior imaging 9/17.   #Polysubstance use disorder #History of alcohol withdrawal seizures #History of opioid overdose Extensive counseling on transitioning to methadone was had with the patient today.  He is agreeable to this plan. -Initiate methadone at 5 mg p.o. 3 times daily  -Titrate as needed -Oxycodone 10 mg p.o. every 4 hours as needed breakthrough pain -CIWA, COWS have both been elevated -Gabapentin 300 mg p.o. 3 times daily -Flexeril 5 mg p.o. 3 times daily Tylenol 650 mg p.o. every 6 for headache -Phenergan 25 mg IV every 6 hours PRN -Narcan as needed added to medical management   #Hepatitis C positive #Transaminitis Hepatitis C antibody positive with RNA quant of 5.974 and hepatitis C Qn of 941,000. -We will refer for outpatient infectious disease follow-up  Best Practice: Diet: Regular diet IVF: Fluids: None VTE: enoxaparin (LOVENOX) injection 40 mg Start: 03/15/21 0030 Code: Full AB: Doxycycline 100 mg IV twice daily DISPO: Anticipated discharge in 1-3 days to Home pending Medication compliance.  Signature: Champ Mungo, D.O.  Internal Medicine Resident, PGY-1 [facility]  Pager: [phone] 12:52 PM, 03/18/2021   Please contact the on call pager after 5 pm and on weekends at [phone].

## 2021-03-19 LAB — AEROBIC CULTURE W GRAM STAIN (SUPERFICIAL SPECIMEN)

## 2021-03-19 LAB — CBC
HCT: 35.3 % — ABNORMAL LOW (ref 39.0–52.0)
Hemoglobin: 12 g/dL — ABNORMAL LOW (ref 13.0–17.0)
MCH: 31.9 pg (ref 26.0–34.0)
MCHC: 34 g/dL (ref 30.0–36.0)
MCV: 93.9 fL (ref 80.0–100.0)
Platelets: 288 10*3/uL (ref 150–400)
RBC: 3.76 MIL/uL — ABNORMAL LOW (ref 4.22–5.81)
RDW: 14.7 % (ref 11.5–15.5)
WBC: 9.1 10*3/uL (ref 4.0–10.5)
nRBC: 0 % (ref 0.0–0.2)

## 2021-03-19 LAB — COMPREHENSIVE METABOLIC PANEL
ALT: 118 U/L — ABNORMAL HIGH (ref 0–44)
AST: 95 U/L — ABNORMAL HIGH (ref 15–41)
Albumin: 3.1 g/dL — ABNORMAL LOW (ref 3.5–5.0)
Alkaline Phosphatase: 76 U/L (ref 38–126)
Anion gap: 7 (ref 5–15)
BUN: 25 mg/dL — ABNORMAL HIGH (ref 6–20)
CO2: 25 mmol/L (ref 22–32)
Calcium: 9.1 mg/dL (ref 8.9–10.3)
Chloride: 102 mmol/L (ref 98–111)
Creatinine, Ser: 0.77 mg/dL (ref 0.61–1.24)
GFR, Estimated: >60 mL/min (ref >60)
Glucose, Bld: 106 mg/dL — ABNORMAL HIGH (ref 70–99)
Potassium: 4.3 mmol/L (ref 3.5–5.1)
Sodium: 134 mmol/L — ABNORMAL LOW (ref 135–145)
Total Bilirubin: 0.3 mg/dL (ref 0.3–1.2)
Total Protein: 5.8 g/dL — ABNORMAL LOW (ref 6.5–8.1)

## 2021-03-19 LAB — CULTURE, BLOOD (ROUTINE X 2)
Culture: NO GROWTH
Culture: NO GROWTH

## 2021-03-19 LAB — GLUCOSE, CAPILLARY: Glucose-Capillary: 96 mg/dL (ref 70–99)

## 2021-03-19 MED ORDER — METHADONE HCL 10 MG PO TABS
20.0000 mg | ORAL_TABLET | Freq: Every day | ORAL | Status: DC
  Administered 2021-03-20: 20 mg via ORAL
  Filled 2021-03-19: qty 2

## 2021-03-19 MED ORDER — METHADONE HCL 5 MG PO TABS
5.0000 mg | ORAL_TABLET | Freq: Once | ORAL | Status: AC
Start: 2021-03-19 — End: 2021-03-19
  Administered 2021-03-19: 5 mg via ORAL
  Filled 2021-03-19: qty 1

## 2021-03-19 MED ORDER — MELATONIN 5 MG PO TABS
5.0000 mg | ORAL_TABLET | Freq: Every day | ORAL | Status: DC
  Administered 2021-03-19: 5 mg via ORAL
  Filled 2021-03-19: qty 1

## 2021-03-19 NOTE — Progress Notes (Signed)
HD#4 SUBJECTIVE:  Patient Summary: George Summers is a 40 y.o. 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  Overnight Events: None  Interim History: Patient states that his LLE pain has been stable to worse since yesterday.  Patient is overall very frustrated today, stating that he feels that his current methadone regimen is not helping and that it is at a different dose than what he was on prior.   OBJECTIVE:  Vital Signs: Vitals:   03/18/21 0903 03/18/21 2018 03/19/21 0012 03/19/21 0455  BP: 112/77 110/69 111/69 (!) 104/56  Pulse: (!) 112 (!) 112 (!) 110 (!) 105  Resp: 18 16 18 18   Temp: 98.2 F (36.8 C) 98 F (36.7 C) 98 F (36.7 C) 97.8 F (36.6 C)  TempSrc: Oral Oral Oral Oral  SpO2: 99% 99% 98% 98%   Supplemental O2: Room Air SpO2: 98 %  There were no vitals filed for this visit.   Intake/Output Summary (Last 24 hours) at 03/19/2021 0853 Last data filed at 03/19/2021 0456 Gross per 24 hour  Intake 1690 ml  Output 0 ml  Net 1690 ml    Net IO Since Admission: 4,876.62 mL [03/19/21 0853]  Physical Exam: Constitutional: Patient is ambulating using rolling walker. No acute distress noted. Cardio: Regular rate and rhythm.  No murmurs, rubs, gallops. Pulm: Clear to auscultation bilaterally.  Normal work of breathing on room air. Abdomen: Soft, nontender, nondistended. MSK: Lower left extremity wounds are covered with dressing which is c/d/I. The entire left foot is red, edematous however, but no increased warmth to left foot. Skin: Skin is warm and dry with exception of areas above. Neuro: Alert and oriented x3.  No focal deficit noted. Psych: Normal mood and affect. Patient is speaking  rapidly.  Patient Lines/Drains/Airways Status     Active Line/Drains/Airways     Name Placement date Placement time Site Days   Peripheral IV 03/15/21 20 G 1" Right Antecubital 03/15/21  1556  Antecubital  3   Incision (Closed) 07/30/20 Back Mid;Lower 07/30/20  1237  -- 231   Wound / Incision (Open or Dehisced) 03/15/21 Groin Anterior;Left;Proximal 03/15/21  1941  Groin  3   Wound / Incision (Open or Dehisced) 03/15/21 Pretibial Left 03/15/21  1942  Pretibial  3             ASSESSMENT/PLAN:  Assessment: Principal Problem:   Open wound of left lower extremity Active Problems:   MSSA bacteremia   Cellulitis   Plan: #Wound infection WBC improved to 9.1 from 19.3 yesterday. He remains hemodynamically stable, afebrile.  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 Echocardiogram completed 9/29 showed LVEF 60 to 65%, normal function, no wall motion abnormalities; RV WNL; no tricuspid, mitral valve vegetations. -Trend CBC   #Nondisplaced fractures of anteromedial distal tibia, medial malleolus, distal fibula CT showed nondisplaced fractures unchanged from prior imaging 9/17.   #Polysubstance use disorder #History of alcohol withdrawal seizures #History of opioid overdose Extensive counseling on transitioning to methadone was had with the patient today.  He is agreeable  to this plan. -Increased methadone to 20 mg po, qd today  -Titrate as needed -Oxycodone 10 mg p.o. every 4 hours as needed breakthrough pain -CIWA, COWS have both been elevated -Gabapentin 300 mg p.o. 3 times daily -Flexeril 5 mg p.o. 3 times daily -Tylenol 650 mg p.o. every 6 for headache -Phenergan 25 mg IV every 6 hours PRN -Narcan as  needed -Pt refused further CBG monitoring, discontinued today   #Hepatitis C positive #Transaminitis Hepatitis C antibody positive with RNA quant of 5.974 and hepatitis C Qn of 941,000. -We will refer for outpatient infectious disease follow-up  Best Practice: Diet: Regular diet IVF: Fluids: None VTE: enoxaparin (LOVENOX) injection 40 mg Start: 03/15/21 0030 Code: Full AB: Doxycycline 100 mg IV twice daily DISPO: Anticipated discharge in 2-3 days to Home pending Medication compliance.  Signature: Fredonia Highland, MD Internal Medicine Resident, PGY-1 [facility]  Pager: [phone] 8:53 AM, 03/19/2021   Please contact the on call pager after 5 pm and on weekends at [phone].

## 2021-03-19 NOTE — Progress Notes (Signed)
Patient changed both dressings today independently.

## 2021-03-20 ENCOUNTER — Other Ambulatory Visit: Payer: Self-pay | Admitting: Internal Medicine

## 2021-03-20 DIAGNOSIS — S82842S Displaced bimalleolar fracture of left lower leg, sequela: Secondary | ICD-10-CM

## 2021-03-20 DIAGNOSIS — S81802D Unspecified open wound, left lower leg, subsequent encounter: Secondary | ICD-10-CM

## 2021-03-20 LAB — COMPREHENSIVE METABOLIC PANEL
ALT: 129 U/L — ABNORMAL HIGH (ref 0–44)
AST: 95 U/L — ABNORMAL HIGH (ref 15–41)
Albumin: 3.2 g/dL — ABNORMAL LOW (ref 3.5–5.0)
Alkaline Phosphatase: 88 U/L (ref 38–126)
Anion gap: 8 (ref 5–15)
BUN: 22 mg/dL — ABNORMAL HIGH (ref 6–20)
CO2: 28 mmol/L (ref 22–32)
Calcium: 9.3 mg/dL (ref 8.9–10.3)
Chloride: 100 mmol/L (ref 98–111)
Creatinine, Ser: 0.66 mg/dL (ref 0.61–1.24)
GFR, Estimated: >60 mL/min (ref >60)
Glucose, Bld: 95 mg/dL (ref 70–99)
Potassium: 4 mmol/L (ref 3.5–5.1)
Sodium: 136 mmol/L (ref 135–145)
Total Bilirubin: 0.6 mg/dL (ref 0.3–1.2)
Total Protein: 6.1 g/dL — ABNORMAL LOW (ref 6.5–8.1)

## 2021-03-20 LAB — CBC
HCT: 38.1 % — ABNORMAL LOW (ref 39.0–52.0)
Hemoglobin: 12.7 g/dL — ABNORMAL LOW (ref 13.0–17.0)
MCH: 31.1 pg (ref 26.0–34.0)
MCHC: 33.3 g/dL (ref 30.0–36.0)
MCV: 93.2 fL (ref 80.0–100.0)
Platelets: 374 10*3/uL (ref 150–400)
RBC: 4.09 MIL/uL — ABNORMAL LOW (ref 4.22–5.81)
RDW: 14.6 % (ref 11.5–15.5)
WBC: 10.2 10*3/uL (ref 4.0–10.5)
nRBC: 0 % (ref 0.0–0.2)

## 2021-03-20 MED ORDER — METHADONE HCL 10 MG PO TABS: 20.0000 mg | ORAL_TABLET | Freq: Every day | ORAL | 0 refills | Status: AC

## 2021-03-20 MED ORDER — DOXYCYCLINE HYCLATE 100 MG PO TABS: 100.0000 mg | ORAL_TABLET | Freq: Two times a day (BID) | ORAL | Status: AC

## 2021-03-20 NOTE — Progress Notes (Signed)
Methadone 20 mg prescribed x 1 for outpatient prescription to cover pain related to bimalleolar ankle fracture until he can establish with outside provider 03/22/21. Patient left hospital AMA.

## 2021-03-20 NOTE — Progress Notes (Signed)
Pt c/o left leg pain(10 out 10 pain rate scale). Oxycodone 10mg  not due until 0456. Called on call MD and received order --can go ahead give early the Oxycodone 10mg  PO.

## 2021-03-20 NOTE — Plan of Care (Signed)
  Problem: Nutrition: Goal: Adequate nutrition will be maintained Outcome: Progressing   Problem: Pain Managment: Goal: General experience of comfort will improve Outcome: Progressing   Problem: Safety: Goal: Ability to remain free from injury will improve Outcome: Progressing   
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
## 2021-03-20 NOTE — Progress Notes (Signed)
NT went to empty trash in the BR. NT noticed used syringes in the basin. Pt stated "it was his friend` he is going to return it to his friend when he goes home" MD on call notified and will come see pt.

## 2021-03-21 ENCOUNTER — Other Ambulatory Visit: Payer: Self-pay | Admitting: Student

## 2021-03-21 LAB — GLUCOSE, CAPILLARY: Glucose-Capillary: 97 mg/dL (ref 70–99)

## 2021-03-21 MED ORDER — DOXYCYCLINE HYCLATE 50 MG PO CAPS: 100.0000 mg | ORAL_CAPSULE | Freq: Two times a day (BID) | ORAL | 0 refills | Status: AC

## 2021-03-24 ENCOUNTER — Telehealth: Payer: Self-pay | Admitting: *Deleted

## 2021-03-24 NOTE — Telephone Encounter (Signed)
Patient called in, upset, speaking rapidly. States the hospital does not care for him. States they put a splint on his wound without cleaning it first and it got infected. States he has a Clinical research associate interested in taking his case. States he was not given any antibiotics or pain med at discharge on 10/2. Offered appt today for HFU and med management, however, he is at work, requests appt for tomorrow. HFU given for tomorrow at 1000 with [facility]. He is aware to head back to ED if develops fever or pain is not bearable. Patient was calm and appreciative by end of call.

## 2021-03-25 ENCOUNTER — Ambulatory Visit (INDEPENDENT_AMBULATORY_CARE_PROVIDER_SITE_OTHER): Payer: Self-pay | Admitting: Internal Medicine

## 2021-03-25 VITALS — BP 114/64 | HR 99 | Temp 97.8°F | Ht 70.0 in | Wt 150.1 lb

## 2021-03-25 DIAGNOSIS — S81802D Unspecified open wound, left lower leg, subsequent encounter: Secondary | ICD-10-CM

## 2021-03-25 DIAGNOSIS — R7881 Bacteremia: Secondary | ICD-10-CM

## 2021-03-25 DIAGNOSIS — B9561 Methicillin susceptible Staphylococcus aureus infection as the cause of diseases classified elsewhere: Secondary | ICD-10-CM

## 2021-03-25 DIAGNOSIS — F1193 Opioid use, unspecified with withdrawal: Secondary | ICD-10-CM

## 2021-03-25 DIAGNOSIS — S82842S Displaced bimalleolar fracture of left lower leg, sequela: Secondary | ICD-10-CM | POA: Insufficient documentation

## 2021-03-25 DIAGNOSIS — B1920 Unspecified viral hepatitis C without hepatic coma: Secondary | ICD-10-CM | POA: Insufficient documentation
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
## 2021-03-25 NOTE — Patient Instructions (Signed)
[facility] [address], [place] [address] Office: [phone]  Fax: [phone] Accept walk-ins  [facility]  [phone]  [address] [place], [place]     [facility]  [phone] - [place], [place]    [facility]  Syringe exchange, assistance with treatment  [phone]

## 2021-03-25 NOTE — Assessment & Plan Note (Addendum)
Patient states since leaving AMA on March 20, 2021 he has not used heroin. Patient had a follow up appt with methadone clinic March 22, 2021. Patient states he do not go and does not want to be on methadone. Patient was informed of options to see OUD clinic here at the clinic on Mondays, patient declines. Patient was also informed of starting Suboxone treatment, patient declines. Patient reiterates that he does not want to be on any medications and declines options. Patient was provided with list of resources to assist if he decides to seek help. Patient declines Child psychotherapist assistance here or referral to chronic care management here.

## 2021-03-25 NOTE — Assessment & Plan Note (Signed)
Patient was advised to follow up with infectious disease of for further treatment. ID clinic accepts uninsured patients. Patient states he may or may not attend appt.  PLAN: ID referral

## 2021-03-25 NOTE — Progress Notes (Addendum)
Internal Medicine Clinic Attending  I saw and evaluated the patient.  I personally confirmed the key portions of the history and exam documented by Dr. Ruben Im and I reviewed pertinent patient test results.  The assessment, diagnosis, and plan were formulated together and I agree with the documentation in the resident's note.  LLE wound appeared clean and healing without surrounding evidence of cellulitis. Wound was re-dressed by nursing staff and patient was provided with additional dressing supplies. Discussed referral to orthopedics for follow-up of left bimalleolar fracture which patient declined. Removable boot provided by ortho tech.  Unfortunately, patient was very upset at the health system and reported not wanting to be seen by providers here. Also declined follow-up MRI spine. He was not open to labs today and did not want to schedule a return visit. Declined assistance from social work. Denies heroin use since discharge, however offered OUD resources although he states he does not want to be on methadone or other OUD medications and did not attend his appointment earlier this week. Provided with financial assistance paperwork which he was agreeable to. Following discussion, patient was more open to referral to ID for hepatitis C evaluation and treatment. Strongly encouraged him to seek care as needed for any worsening or change in condition.  Upon further chart review following today's visit, noted left groin wound also present during admission which we were unable to address today given focus on lower leg wound, ankle, and other f/u discussion. I was able to reach out by telephone to the patient to discuss. He noted it hasn't been bothering him so he forgot to mention the groin wound, but he has been changing the dressing and keeping the area clean. He feels it is healing, looking much better and is not bothering him in any way. Encouraged him to continue great wound care and call us or return to  the clinic/hospital if anything is worsening. Also discussed ankle again and recommendation to be non-weight bearing, which is very challenging given his work and transportation. We discussed risks of weight bearing and confirmed patient does have crutches, and he seemed agreeable to use as much as possible. At the end of our discussion, patient stated he may be open to orthopedics referral if it would be covered. Encouraged him to fill out financial assistance paperwork provided today, although I can attempt to explore if they will see him without coverage as well. He seemed very appreciative of the call and I encouraged him to return to clinic as needed.  Addendum 10/12: Unable to reach Mr. Roser after multiple f/u attempts. Without insurance he will need to pay for orthopedics visit. Can continue to address pending insurance/assistance coverage.
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
## 2021-03-25 NOTE — Assessment & Plan Note (Signed)
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 Per chart review, treatment was incomplete. Imaging was not conducted at that time. Patient denies back pain, numbness/tingling of lower extremities, or bowel/bladder incontinence. Patient however, complains of intermittent fever/chills. Patient was informed that he needs MRI of the spine to r/u clearance of infection and a follow up appt with orthopedic surgery. Patient states he does not need an MRI and declines ortho follow up or imaging at this time. Patient was informed of the risks and to seek medical attention if condition worsens.

## 2021-03-28 ENCOUNTER — Ambulatory Visit: Payer: Self-pay | Admitting: Internal Medicine

## 2021-03-29 ENCOUNTER — Encounter: Payer: Medicaid Other | Admitting: Internal Medicine

## 2021-03-30 ENCOUNTER — Ambulatory Visit (INDEPENDENT_AMBULATORY_CARE_PROVIDER_SITE_OTHER): Payer: Self-pay | Admitting: Internal Medicine

## 2021-03-30 ENCOUNTER — Other Ambulatory Visit: Payer: Self-pay

## 2021-03-30 ENCOUNTER — Telehealth: Payer: Self-pay

## 2021-03-30 ENCOUNTER — Other Ambulatory Visit (HOSPITAL_COMMUNITY): Payer: Self-pay

## 2021-03-30 ENCOUNTER — Encounter: Payer: Self-pay | Admitting: Internal Medicine

## 2021-03-30 VITALS — BP 105/73 | HR 119 | Temp 98.0°F | Wt 149.0 lb

## 2021-03-30 DIAGNOSIS — B1920 Unspecified viral hepatitis C without hepatic coma: Secondary | ICD-10-CM

## 2021-03-30 NOTE — Patient Instructions (Signed)
Follow up in 1 week
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
## 2021-03-30 NOTE — Telephone Encounter (Signed)
[facility] Patient Advocate Encounter ? ?Insurance verification completed.   ? ?The patient is uninsured and will need patient assistance for medication. ? ?We can complete the application and will need to meet with the patient for signatures and income documentation. ? ?Nachmen Mansel, CPhT ?Specialty Pharmacy Patient Advocate ?[facility] ?Phone: [phone] ?Fax:  [phone]  ?

## 2021-04-06 ENCOUNTER — Ambulatory Visit: Payer: Self-pay | Admitting: Internal Medicine

## 2022-06-05 ENCOUNTER — Emergency Department (HOSPITAL_COMMUNITY): Payer: Commercial Managed Care - HMO

## 2022-06-05 ENCOUNTER — Inpatient Hospital Stay (HOSPITAL_COMMUNITY)
Admission: EM | Admit: 2022-06-05 | Discharge: 2022-06-06 | DRG: 917 | Disposition: A | Discharge status: Home / Self Care | Payer: Commercial Managed Care - HMO | Attending: Critical Care Medicine | Admitting: Critical Care Medicine

## 2022-06-05 ENCOUNTER — Other Ambulatory Visit: Payer: Self-pay

## 2022-06-05 DIAGNOSIS — F17210 Nicotine dependence, cigarettes, uncomplicated: Secondary | ICD-10-CM | POA: Diagnosis present

## 2022-06-05 DIAGNOSIS — N179 Acute kidney failure, unspecified: Secondary | ICD-10-CM | POA: Diagnosis present

## 2022-06-05 DIAGNOSIS — I4901 Ventricular fibrillation: Secondary | ICD-10-CM | POA: Diagnosis present

## 2022-06-05 DIAGNOSIS — I462 Cardiac arrest due to underlying cardiac condition: Secondary | ICD-10-CM | POA: Diagnosis present

## 2022-06-05 DIAGNOSIS — I469 Cardiac arrest, cause unspecified: Secondary | ICD-10-CM | POA: Diagnosis present

## 2022-06-05 DIAGNOSIS — J690 Pneumonitis due to inhalation of food and vomit: Secondary | ICD-10-CM | POA: Diagnosis present

## 2022-06-05 DIAGNOSIS — Z66 Do not resuscitate: Secondary | ICD-10-CM | POA: Diagnosis present

## 2022-06-05 DIAGNOSIS — G40901 Epilepsy, unspecified, not intractable, with status epilepticus: Secondary | ICD-10-CM | POA: Diagnosis present

## 2022-06-05 DIAGNOSIS — I5020 Unspecified systolic (congestive) heart failure: Secondary | ICD-10-CM | POA: Diagnosis not present

## 2022-06-05 DIAGNOSIS — T405X1A Poisoning by cocaine, accidental (unintentional), initial encounter: Principal | ICD-10-CM | POA: Diagnosis present

## 2022-06-05 DIAGNOSIS — Z781 Physical restraint status: Secondary | ICD-10-CM | POA: Diagnosis not present

## 2022-06-05 DIAGNOSIS — Z5900 Homelessness unspecified: Secondary | ICD-10-CM

## 2022-06-05 DIAGNOSIS — Z8249 Family history of ischemic heart disease and other diseases of the circulatory system: Secondary | ICD-10-CM

## 2022-06-05 DIAGNOSIS — I110 Hypertensive heart disease with heart failure: Secondary | ICD-10-CM | POA: Diagnosis present

## 2022-06-05 DIAGNOSIS — R4182 Altered mental status, unspecified: Secondary | ICD-10-CM | POA: Diagnosis not present

## 2022-06-05 DIAGNOSIS — E874 Mixed disorder of acid-base balance: Secondary | ICD-10-CM | POA: Diagnosis present

## 2022-06-05 DIAGNOSIS — Z7189 Other specified counseling: Secondary | ICD-10-CM | POA: Diagnosis not present

## 2022-06-05 DIAGNOSIS — Z79899 Other long term (current) drug therapy: Secondary | ICD-10-CM | POA: Diagnosis not present

## 2022-06-05 DIAGNOSIS — E861 Hypovolemia: Secondary | ICD-10-CM | POA: Diagnosis not present

## 2022-06-05 DIAGNOSIS — I5021 Acute systolic (congestive) heart failure: Secondary | ICD-10-CM | POA: Diagnosis present

## 2022-06-05 DIAGNOSIS — G931 Anoxic brain damage, not elsewhere classified: Secondary | ICD-10-CM | POA: Diagnosis present

## 2022-06-05 DIAGNOSIS — E440 Moderate protein-calorie malnutrition: Secondary | ICD-10-CM | POA: Diagnosis present

## 2022-06-05 DIAGNOSIS — K7201 Acute and subacute hepatic failure with coma: Secondary | ICD-10-CM | POA: Diagnosis present

## 2022-06-05 DIAGNOSIS — E872 Acidosis, unspecified: Secondary | ICD-10-CM | POA: Diagnosis present

## 2022-06-05 DIAGNOSIS — J9601 Acute respiratory failure with hypoxia: Secondary | ICD-10-CM | POA: Diagnosis present

## 2022-06-05 DIAGNOSIS — Z809 Family history of malignant neoplasm, unspecified: Secondary | ICD-10-CM

## 2022-06-05 DIAGNOSIS — T50904D Poisoning by unspecified drugs, medicaments and biological substances, undetermined, subsequent encounter: Secondary | ICD-10-CM | POA: Diagnosis not present

## 2022-06-05 DIAGNOSIS — F1410 Cocaine abuse, uncomplicated: Secondary | ICD-10-CM | POA: Diagnosis present

## 2022-06-05 DIAGNOSIS — Z681 Body mass index (BMI) 19 or less, adult: Secondary | ICD-10-CM | POA: Diagnosis not present

## 2022-06-05 DIAGNOSIS — G629 Polyneuropathy, unspecified: Secondary | ICD-10-CM | POA: Diagnosis present

## 2022-06-05 DIAGNOSIS — Z515 Encounter for palliative care: Secondary | ICD-10-CM | POA: Diagnosis not present

## 2022-06-05 LAB — COMPREHENSIVE METABOLIC PANEL
ALT: 135 U/L — ABNORMAL HIGH (ref 0–44)
AST: 414 U/L — ABNORMAL HIGH (ref 15–41)
Albumin: 3.5 g/dL (ref 3.5–5.0)
Alkaline Phosphatase: 164 U/L — ABNORMAL HIGH (ref 38–126)
Anion gap: 19 — ABNORMAL HIGH (ref 5–15)
BUN: 9 mg/dL (ref 6–20)
CO2: 19 mmol/L — ABNORMAL LOW (ref 22–32)
Calcium: 8.4 mg/dL — ABNORMAL LOW (ref 8.9–10.3)
Chloride: 100 mmol/L (ref 98–111)
Creatinine, Ser: 1.25 mg/dL — ABNORMAL HIGH (ref 0.61–1.24)
GFR, Estimated: >60 mL/min (ref >60)
Glucose, Bld: 327 mg/dL — ABNORMAL HIGH (ref 70–99)
Potassium: 3.8 mmol/L (ref 3.5–5.1)
Sodium: 138 mmol/L (ref 135–145)
Total Bilirubin: 0.4 mg/dL (ref 0.3–1.2)
Total Protein: 6.3 g/dL — ABNORMAL LOW (ref 6.5–8.1)

## 2022-06-05 LAB — CBC
HCT: 47.6 % (ref 39.0–52.0)
HCT: 57.1 % — ABNORMAL HIGH (ref 39.0–52.0)
Hemoglobin: 15.6 g/dL (ref 13.0–17.0)
Hemoglobin: 19.4 g/dL — ABNORMAL HIGH (ref 13.0–17.0)
MCH: 33.8 pg (ref 26.0–34.0)
MCH: 33.7 pg (ref 26.0–34.0)
MCHC: 32.8 g/dL (ref 30.0–36.0)
MCHC: 34 g/dL (ref 30.0–36.0)
MCV: 103.3 fL — ABNORMAL HIGH (ref 80.0–100.0)
MCV: 99.3 fL (ref 80.0–100.0)
Platelets: 188 10*3/uL (ref 150–400)
Platelets: 203 10*3/uL (ref 150–400)
RBC: 4.61 MIL/uL (ref 4.22–5.81)
RBC: 5.75 MIL/uL (ref 4.22–5.81)
RDW: 13.8 % (ref 11.5–15.5)
RDW: 14 % (ref 11.5–15.5)
WBC: 8.6 10*3/uL (ref 4.0–10.5)
WBC: 13.4 10*3/uL — ABNORMAL HIGH (ref 4.0–10.5)
nRBC: 0.3 % — ABNORMAL HIGH (ref 0.0–0.2)
nRBC: 0 % (ref 0.0–0.2)

## 2022-06-05 LAB — LACTIC ACID, PLASMA: Lactic Acid, Venous: 8.6 mmol/L (ref 0.5–1.9)

## 2022-06-05 LAB — I-STAT ARTERIAL BLOOD GAS, ED
Acid-base deficit: 12 mmol/L — ABNORMAL HIGH (ref 0.0–2.0)
Bicarbonate: 15.6 mmol/L — ABNORMAL LOW (ref 20.0–28.0)
Calcium, Ion: 1.12 mmol/L — ABNORMAL LOW (ref 1.15–1.40)
HCT: 52 % (ref 39.0–52.0)
Hemoglobin: 17.7 g/dL — ABNORMAL HIGH (ref 13.0–17.0)
O2 Saturation: 100 %
Patient temperature: 96
Potassium: 3.9 mmol/L (ref 3.5–5.1)
Sodium: 136 mmol/L (ref 135–145)
TCO2: 17 mmol/L — ABNORMAL LOW (ref 22–32)
pCO2 arterial: 37.1 mmHg (ref 32–48)
pH, Arterial: 7.224 — ABNORMAL LOW (ref 7.35–7.45)
pO2, Arterial: 213 mmHg — ABNORMAL HIGH (ref 83–108)

## 2022-06-05 LAB — RAPID URINE DRUG SCREEN, HOSP PERFORMED
Amphetamines: NOT DETECTED
Barbiturates: NOT DETECTED
Benzodiazepines: NOT DETECTED
Cocaine: POSITIVE — AB
Opiates: NOT DETECTED
Tetrahydrocannabinol: POSITIVE — AB

## 2022-06-05 LAB — URINALYSIS, ROUTINE W REFLEX MICROSCOPIC
Bilirubin Urine: NEGATIVE
Glucose, UA: >=500 mg/dL — AB
Ketones, ur: NEGATIVE mg/dL
Leukocytes,Ua: NEGATIVE
Nitrite: NEGATIVE
Protein, ur: >=300 mg/dL — AB
Specific Gravity, Urine: 1.007 (ref 1.005–1.030)
pH: 7 (ref 5.0–8.0)

## 2022-06-05 LAB — TROPONIN I (HIGH SENSITIVITY): Troponin I (High Sensitivity): 46 ng/L — ABNORMAL HIGH (ref <18)

## 2022-06-05 LAB — PHOSPHORUS: Phosphorus: 11.4 mg/dL — ABNORMAL HIGH (ref 2.5–4.6)

## 2022-06-05 LAB — TYPE AND SCREEN
ABO/RH(D): O POS
Antibody Screen: NEGATIVE

## 2022-06-05 LAB — MAGNESIUM: Magnesium: 2.6 mg/dL — ABNORMAL HIGH (ref 1.7–2.4)

## 2022-06-05 LAB — APTT: aPTT: 37 seconds — ABNORMAL HIGH (ref 24–36)

## 2022-06-05 LAB — PROTIME-INR
INR: 1 (ref 0.8–1.2)
Prothrombin Time: 13.4 seconds (ref 11.4–15.2)

## 2022-06-05 LAB — CBG MONITORING, ED: Glucose-Capillary: 286 mg/dL — ABNORMAL HIGH (ref 70–99)

## 2022-06-05 MED ORDER — PROPOFOL 1000 MG/100ML IV EMUL
0.0000 ug/kg/min | INTRAVENOUS | Status: DC
Start: 2022-06-05 — End: 2022-06-05

## 2022-06-05 MED ORDER — LACTATED RINGERS IV BOLUS
1000.0000 mL | Freq: Once | INTRAVENOUS | Status: AC
  Administered 2022-06-05: 1000 mL via INTRAVENOUS

## 2022-06-05 MED ORDER — POLYETHYLENE GLYCOL 3350 17 G PO PACK: 17.0000 g | PACK | Freq: Every day | ORAL | Status: DC | PRN

## 2022-06-05 MED ORDER — FENTANYL CITRATE PF 50 MCG/ML IJ SOSY
50.0000 ug | PREFILLED_SYRINGE | INTRAMUSCULAR | Status: DC | PRN
  Administered 2022-06-05 – 2022-06-06 (×2): 100 ug via INTRAVENOUS
  Administered 2022-06-06 (×3): 150 ug via INTRAVENOUS
  Administered 2022-06-06 (×2): 100 ug via INTRAVENOUS
  Filled 2022-06-05 (×2): qty 2
  Filled 2022-06-05: qty 3
  Filled 2022-06-05: qty 4
  Filled 2022-06-05 (×2): qty 3
  Filled 2022-06-05: qty 2

## 2022-06-05 MED ORDER — PROPOFOL 1000 MG/100ML IV EMUL
5.0000 ug/kg/min | INTRAVENOUS | Status: DC
  Administered 2022-06-05: 10 ug/kg/min via INTRAVENOUS
  Administered 2022-06-06: 50 ug/kg/min via INTRAVENOUS
  Filled 2022-06-05 (×2): qty 100

## 2022-06-05 MED ORDER — DOCUSATE SODIUM 50 MG/5ML PO LIQD: 100.0000 mg | Freq: Two times a day (BID) | ORAL | Status: DC | PRN

## 2022-06-05 MED ORDER — FENTANYL CITRATE PF 50 MCG/ML IJ SOSY
50.0000 ug | PREFILLED_SYRINGE | INTRAMUSCULAR | Status: AC | PRN
  Administered 2022-06-05 (×3): 50 ug via INTRAVENOUS
  Filled 2022-06-05 (×3): qty 1

## 2022-06-05 MED ORDER — ENOXAPARIN SODIUM 40 MG/0.4ML IJ SOSY
40.0000 mg | PREFILLED_SYRINGE | INTRAMUSCULAR | Status: DC
  Administered 2022-06-06: 40 mg via SUBCUTANEOUS
  Filled 2022-06-05: qty 0.4

## 2022-06-05 NOTE — ED Provider Notes (Incomplete Revision)
[facility] EMERGENCY DEPARTMENT Provider Note   CSN: [id number] Arrival date & time: 06/05/22  2014     History  Chief Complaint  Patient presents with   post cpr    George Summers is a 41 y.o. male.   Cardiac Arrest Witnessed by:  Bystander Incident location: community. Time since incident:  1 hour Time before BLS initiated:  3-5 minutes Time before ALS initiated:  8-10 minutes Condition upon EMS arrival:  Apneic and unresponsive Pulse:  Absent Initial cardiac rhythm per EMS:  PEA (slow, narrow complex) Treatments prior to arrival:  ACLS protocol and intubation Medications given prior to ED:  Epinephrine Airway:  Intubation prior to arrival Rhythm on admission to ED:  Sinus tachycardia  Bystander witnessed cardiac arrest after patient snorted unknown substance. He turned blue and collapsed, fire was present on scene within 5 minutes and started CPR. Initial rhythm PEA with slow, narrow complex. At one point was Vfib, shocked, no ROSC. Went back into PEA. Received total of CPR for about 15 minutes, epi x3. Achieved ROSC, was intubated during CPR. Was also given 3 mg narcan. After, was slightly agitated so he received versed and fentanyl en route. Initial post ROSC ECG showed concerns for STEMI with ST elevations in leads aVR and V1, diffuse ST depressions.    Home Medications Prior to Admission medications   Medication Sig Start Date End Date Taking? Authorizing Provider  albuterol (VENTOLIN HFA) 108 (90 Base) MCG/ACT inhaler Inhale 1-2 puffs into the lungs every 4 (four) hours as needed for wheezing. 12/27/20   Early, Sung Amabile, NP  azithromycin (ZITHROMAX) 250 MG tablet Take 1 tablet (250 mg total) by mouth daily. Take 1 pill for the next four days. 06/27/21   Achille Rich, PA-C  fluticasone (FLONASE) 50 MCG/ACT nasal spray Place 2 sprays into both nostrils daily. 12/27/20   Early, Sung Amabile, NP  montelukast (SINGULAIR) 10 MG tablet Take 1 tablet (10 mg total)  by mouth at bedtime. 12/27/20   Tollie Eth, NP  pantoprazole (PROTONIX) 40 MG tablet Take 1 tablet (40 mg total) by mouth daily. 12/27/20   Tollie Eth, NP      Allergies    Patient has no known allergies.    Review of Systems   Review of Systems Negative except as per HPI Physical Exam Updated Vital Signs BP (!) 116/93   Pulse (!) 135   Temp (!) 96.6 F (35.9 C)   Resp 16   Ht 5\' 9"  (1.753 m)   Wt 80 kg   SpO2 99%   BMI 26.05 kg/m  Physical Exam Vitals and nursing note reviewed.  Constitutional:      Appearance: He is well-developed.     Interventions: He is intubated.  HENT:     Head: Normocephalic and atraumatic.  Eyes:     Conjunctiva/sclera: Conjunctivae normal.     Pupils: Pupils are equal, round, and reactive to light.     Comments: Pupils 3 mm minimally reactive  Cardiovascular:     Rate and Rhythm: Regular rhythm. Tachycardia present.     Pulses:          Carotid pulses are 2+ on the right side and 2+ on the left side.      Radial pulses are 2+ on the right side and 2+ on the left side.       Femoral pulses are 2+ on the right side and 2+ on the left side.  Dorsalis pedis pulses are 2+ on the right side and 2+ on the left side.     Heart sounds: No murmur heard. Pulmonary:     Effort: Pulmonary effort is normal. No respiratory distress. He is intubated.     Breath sounds: Normal breath sounds.  Abdominal:     General: There is no distension.     Palpations: Abdomen is soft.     Tenderness: There is no abdominal tenderness. There is no guarding or rebound.  Musculoskeletal:        General: No swelling.     Cervical back: Neck supple.     Right lower leg: No edema.     Left lower leg: No edema.  Skin:    General: Skin is warm and dry.     Capillary Refill: Capillary refill takes less than 2 seconds.  Neurological:     Cranial Nerves: No facial asymmetry.  Psychiatric:        Mood and Affect: Mood normal.     ED Results / Procedures /  Treatments   Labs (all labs ordered are listed, but only abnormal results are displayed) Labs Reviewed  COMPREHENSIVE METABOLIC PANEL - Abnormal; Notable for the following components:      Result Value   CO2 19 (*)    Glucose, Bld 327 (*)    Creatinine, Ser 1.25 (*)    Calcium 8.4 (*)    Total Protein 6.3 (*)    AST 414 (*)    ALT 135 (*)    Alkaline Phosphatase 164 (*)    Anion gap 19 (*)    All other components within normal limits  LACTIC ACID, PLASMA - Abnormal; Notable for the following components:   Lactic Acid, Venous 8.6 (*)    All other components within normal limits  CBC - Abnormal; Notable for the following components:   MCV 103.3 (*)    nRBC 0.3 (*)    All other components within normal limits  APTT - Abnormal; Notable for the following components:   aPTT 37 (*)    All other components within normal limits  MAGNESIUM - Abnormal; Notable for the following components:   Magnesium 2.6 (*)    All other components within normal limits  PHOSPHORUS - Abnormal; Notable for the following components:   Phosphorus 11.4 (*)    All other components within normal limits  RAPID URINE DRUG SCREEN, HOSP PERFORMED - Abnormal; Notable for the following components:   Cocaine POSITIVE (*)    Tetrahydrocannabinol POSITIVE (*)    All other components within normal limits  TRIGLYCERIDES - Abnormal; Notable for the following components:   Triglycerides 186 (*)    All other components within normal limits  URINALYSIS, ROUTINE W REFLEX MICROSCOPIC - Abnormal; Notable for the following components:   APPearance HAZY (*)    Glucose, UA >=500 (*)    Hgb urine dipstick SMALL (*)    Protein, ur >=300 (*)    Bacteria, UA RARE (*)    All other components within normal limits  CBC - Abnormal; Notable for the following components:   WBC 13.4 (*)    Hemoglobin 19.4 (*)    HCT 57.1 (*)    All other components within normal limits  LACTIC ACID, PLASMA - Abnormal; Notable for the following  components:   Lactic Acid, Venous 4.2 (*)    All other components within normal limits  CBG MONITORING, ED - Abnormal; Notable for the following components:   Glucose-Capillary 286 (*)  All other components within normal limits  I-STAT ARTERIAL BLOOD GAS, ED - Abnormal; Notable for the following components:   pH, Arterial 7.224 (*)    pO2, Arterial 213 (*)    Bicarbonate 15.6 (*)    TCO2 17 (*)    Acid-base deficit 12.0 (*)    Calcium, Ion 1.12 (*)    Hemoglobin 17.7 (*)    All other components within normal limits  I-STAT ARTERIAL BLOOD GAS, ED - Abnormal; Notable for the following components:   pH, Arterial 7.249 (*)    pO2, Arterial 150 (*)    Bicarbonate 15.7 (*)    TCO2 17 (*)    Acid-base deficit 11.0 (*)    Calcium, Ion 1.14 (*)    HCT 57.0 (*)    Hemoglobin 19.4 (*)    All other components within normal limits  TROPONIN I (HIGH SENSITIVITY) - Abnormal; Notable for the following components:   Troponin I (High Sensitivity) 46 (*)    All other components within normal limits  TROPONIN I (HIGH SENSITIVITY) - Abnormal; Notable for the following components:   Troponin I (High Sensitivity) 147 (*)    All other components within normal limits  PROTIME-INR  PROCALCITONIN  BLOOD GAS, ARTERIAL  LACTIC ACID, PLASMA  LACTIC ACID, PLASMA  LACTIC ACID, PLASMA  HIV ANTIBODY (ROUTINE TESTING W REFLEX)  CREATININE, SERUM  LACTIC ACID, PLASMA  TYPE AND SCREEN  ABO/RH    EKG EKG Interpretation  Date/Time:  Monday June 05 2022 20:26:31 EST Ventricular Rate:  118 PR Interval:  145 QRS Duration: 68 QT Interval:  305 QTC Calculation: 428 R Axis:   83 Text Interpretation: Sinus tachycardia Right atrial enlargement Anterior infarct, old Minimal ST depression, diffuse leads No significant change since last tracing improved ST changes compared to paramedic ekg Confirmed by Varney Biles [phone]) on 06/05/2022 9:38:07 PM  Radiology CT HEAD WO CONTRAST  Result Date:  06/05/2022 CLINICAL DATA:  Found unresponsive after snorting substances. EXAM: CT HEAD WITHOUT CONTRAST TECHNIQUE: Contiguous axial images were obtained from the base of the skull through the vertex without intravenous contrast. RADIATION DOSE REDUCTION: This exam was performed according to the departmental dose-optimization program which includes automated exposure control, adjustment of the mA and/or kV according to patient size and/or use of iterative reconstruction technique. COMPARISON:  None Available. FINDINGS: Brain: There is no acute intracranial hemorrhage, extra-axial fluid collection, or acute infarct. Parenchymal volume is normal. The ventricles are normal in size. Gray-white differentiation appears preserved, without convincing evidence of anoxic brain injury There is no solid mass lesion. There is no mass effect or midline shift. Vascular: No hyperdense vessel or unexpected calcification. Skull: Choose Sinuses/Orbits: There is fluid in the sphenoid sinuses which may be related to instrumentation. The globes and orbits are unremarkable. Other: There is prominent periapical lucency around the imaged maxillary teeth. IMPRESSION: 1. No evidence of acute intracranial pathology. No convincing evidence of anoxic brain injury. 2. Extensive dental disease involving the imaged maxillary teeth. Electronically Signed   By: Valetta Mole M.D.   On: 06/05/2022 21:15   DG Abdomen 1 View  Result Date: 06/05/2022 CLINICAL DATA:  Orogastric tube EXAM: ABDOMEN - 1 VIEW COMPARISON:  None Available. FINDINGS: Orogastric tube tip at the level of the mid stomach. No dilated bowel loops are seen. IMPRESSION: Orogastric tube tip at the level of the mid stomach. Electronically Signed   By: Ronney Asters M.D.   On: 06/05/2022 20:55   DG Chest [facility] [address]  Result Date: 06/05/2022 CLINICAL DATA:  Intubated, OG tube. EXAM: PORTABLE CHEST 1 VIEW COMPARISON:  Chest x-ray 03/21/2021 FINDINGS: Endotracheal tube tip is 4.5 cm  above the carina. Enteric tube extends into the stomach, but distal tip is not included on the image. There is some minimal patchy opacities in the medial left lung base. The lungs are otherwise clear. No pleural effusion or pneumothorax. Cardiomediastinal silhouette is within normal limits. No acute fractures are seen. Are clear. The visualized skeletal structures are unremarkable. IMPRESSION: 1. Endotracheal tube tip is 4.5 cm above the carina. 2. Enteric tube extends into the stomach, but distal tip is not included on the image. 3. Minimal patchy opacities in the medial left lung base, possibly atelectasis or pneumonia. Electronically Signed   By: Ronney Asters M.D.   On: 06/05/2022 20:54    Procedures .Critical Care  Performed by: Phyllis Ginger, MD Authorized by: Varney Biles, MD   Critical care provider statement:    Critical care time (minutes):  35   Critical care start time:  06/05/2022 8:15 PM   Critical care time was exclusive of:  Separately billable procedures and treating other patients and teaching time   Critical care was necessary to treat or prevent imminent or life-threatening deterioration of the following conditions:  Cardiac failure and respiratory failure   Critical care was time spent personally by me on the following activities:  Ordering and performing treatments and interventions, ordering and review of laboratory studies, ordering and review of radiographic studies, pulse oximetry, re-evaluation of patient's condition, ventilator management, examination of patient and discussions with consultants   Care discussed with: admitting provider       Medications Ordered in ED Medications  fentaNYL (SUBLIMAZE) injection 50-200 mcg (150 mcg Intravenous Given 06/16/2022 0008)  propofol (DIPRIVAN) 1000 MG/100ML infusion (40 mcg/kg/min  60 kg (Order-Specific) Intravenous Rate/Dose Change 06/05/22 2101)  docusate (COLACE) 50 MG/5ML liquid 100 mg (has no administration in time  range)  polyethylene glycol (MIRALAX / GLYCOLAX) packet 17 g (has no administration in time range)  enoxaparin (LOVENOX) injection 40 mg (has no administration in time range)  fentaNYL (SUBLIMAZE) injection 50 mcg (50 mcg Intravenous Given 06/05/22 2245)  lactated ringers bolus 1,000 mL (0 mLs Intravenous Stopped 06/05/22 2210)    ED Course/ Medical Decision Making/ A&P                           Medical Decision Making Amount and/or Complexity of Data Reviewed Independent Historian: EMS Labs: ordered. Decision-making details documented in ED Course. Radiology: ordered and independent interpretation performed. Decision-making details documented in ED Course. ECG/medicine tests: ordered and independent interpretation performed. Decision-making details documented in ED Course. Discussion of management or test interpretation with external provider(s): Critical care  Risk Prescription drug management. Decision regarding hospitalization.  Critical Care Total time providing critical care: 35 minutes   Patient presenting after cardiac arrest. Suspected arrest from respiratory failure after drug intoxication. Initially PEA, briefly Vfib, then PEA until ROSC prior to hospital arrival. Intubated prior to arrival. Post ROSC ECG concerning for STEMI with ST elevations in leads aVR and V1, diffuse ST depressions.  On arrival, patient is intubated, initiating occasional spontaneous breaths. He is tachycardic around 120. ECG obtained which per my review shows sinus tachycardia. The ST changes prior to arrival have resolved. CXR obtained which showed intact ETT, KUB obtained which shows normal positioning of OG tube. CT head obtained, showing no evidence of intracranial injury/bleeding/hydrocephalus.  There is no significant blunting of the gray/white matter, no significant signs of anoxic brain injury.   Labs significant for positive cocaine and THC, significant lactic acidemia, signs of shock  liver/acute liver injury from his hypoxia, slightly elevated troponin at 46, likely secondary to cardiac arrest. Low suspicion for primary cardiac event. pH 7.22, arterial O2 is elevated at 213, will bring FiO2 down to 40%. Acidemia likely 2/2 lactic acidosis in the setting of cardiac arrest. Will give 1 L LR, continue trending troponin and lactic acid, and initiate propofol gtt with PRN fentanyl for pain and sedation.  The patient was admitted to the critical care service without any further acute events in the emergency department.  Final Clinical Impression(s) / ED Diagnoses Final diagnoses:  Cardiac arrest [facility])    Rx / DC Orders ED Discharge Orders     None         Phyllis Ginger, MD 05/23/2022 [id number]    Phyllis Ginger, MD 05/30/2022 [place], [place], MD 05/21/2022 1340

## 2022-06-05 NOTE — ED Provider Notes (Addendum)
[facility] EMERGENCY DEPARTMENT Provider Note   CSN: [id number] Arrival date & time: 06/05/22  2014     History  Chief Complaint  Patient presents with   post cpr    George Summers is a 41 y.o. male.   Cardiac Arrest Witnessed by:  Bystander Incident location: community. Time since incident:  1 hour Time before BLS initiated:  3-5 minutes Time before ALS initiated:  8-10 minutes Condition upon EMS arrival:  Apneic and unresponsive Pulse:  Absent Initial cardiac rhythm per EMS:  PEA (slow, narrow complex) Treatments prior to arrival:  ACLS protocol and intubation Medications given prior to ED:  Epinephrine Airway:  Intubation prior to arrival Rhythm on admission to ED:  Sinus tachycardia  Bystander witnessed cardiac arrest after patient snorted unknown substance. He turned blue and collapsed, fire was present on scene within 5 minutes and started CPR. Initial rhythm PEA with slow, narrow complex. At one point was Vfib, shocked, no ROSC. Went back into PEA. Received total of CPR for about 15 minutes, epi x3. Achieved ROSC, was intubated during CPR. Was also given 3 mg narcan. After, was slightly agitated so he received versed and fentanyl en route. Initial post ROSC ECG showed concerns for STEMI with ST elevations in leads aVR and V1, diffuse ST depressions.    Home Medications Prior to Admission medications   Medication Sig Start Date End Date Taking? Authorizing Provider  albuterol (VENTOLIN HFA) 108 (90 Base) MCG/ACT inhaler Inhale 1-2 puffs into the lungs every 4 (four) hours as needed for wheezing. 12/27/20   Early, Sung Amabile, NP  azithromycin (ZITHROMAX) 250 MG tablet Take 1 tablet (250 mg total) by mouth daily. Take 1 pill for the next four days. 06/27/21   Achille Rich, PA-C  fluticasone (FLONASE) 50 MCG/ACT nasal spray Place 2 sprays into both nostrils daily. 12/27/20   Early, Sung Amabile, NP  montelukast (SINGULAIR) 10 MG tablet Take 1 tablet (10 mg total)  by mouth at bedtime. 12/27/20   Tollie Eth, NP  pantoprazole (PROTONIX) 40 MG tablet Take 1 tablet (40 mg total) by mouth daily. 12/27/20   Tollie Eth, NP      Allergies    Patient has no known allergies.    Review of Systems   Review of Systems Negative except as per HPI Physical Exam Updated Vital Signs BP (!) 116/93   Pulse (!) 135   Temp (!) 96.6 F (35.9 C)   Resp 16   Ht 5\' 9"  (1.753 m)   Wt 80 kg   SpO2 99%   BMI 26.05 kg/m  Physical Exam Vitals and nursing note reviewed.  Constitutional:      Appearance: He is well-developed.     Interventions: He is intubated.  HENT:     Head: Normocephalic and atraumatic.  Eyes:     Conjunctiva/sclera: Conjunctivae normal.     Pupils: Pupils are equal, round, and reactive to light.     Comments: Pupils 3 mm minimally reactive  Cardiovascular:     Rate and Rhythm: Regular rhythm. Tachycardia present.     Pulses:          Carotid pulses are 2+ on the right side and 2+ on the left side.      Radial pulses are 2+ on the right side and 2+ on the left side.       Femoral pulses are 2+ on the right side and 2+ on the left side.  Dorsalis pedis pulses are 2+ on the right side and 2+ on the left side.     Heart sounds: No murmur heard. Pulmonary:     Effort: Pulmonary effort is normal. No respiratory distress. He is intubated.     Breath sounds: Normal breath sounds.  Abdominal:     General: There is no distension.     Palpations: Abdomen is soft.     Tenderness: There is no abdominal tenderness. There is no guarding or rebound.  Musculoskeletal:        General: No swelling.     Cervical back: Neck supple.     Right lower leg: No edema.     Left lower leg: No edema.  Skin:    General: Skin is warm and dry.     Capillary Refill: Capillary refill takes less than 2 seconds.  Neurological:     Cranial Nerves: No facial asymmetry.  Psychiatric:        Mood and Affect: Mood normal.     ED Results / Procedures /  Treatments   Labs (all labs ordered are listed, but only abnormal results are displayed) Labs Reviewed  COMPREHENSIVE METABOLIC PANEL - Abnormal; Notable for the following components:      Result Value   CO2 19 (*)    Glucose, Bld 327 (*)    Creatinine, Ser 1.25 (*)    Calcium 8.4 (*)    Total Protein 6.3 (*)    AST 414 (*)    ALT 135 (*)    Alkaline Phosphatase 164 (*)    Anion gap 19 (*)    All other components within normal limits  LACTIC ACID, PLASMA - Abnormal; Notable for the following components:   Lactic Acid, Venous 8.6 (*)    All other components within normal limits  CBC - Abnormal; Notable for the following components:   MCV 103.3 (*)    nRBC 0.3 (*)    All other components within normal limits  APTT - Abnormal; Notable for the following components:   aPTT 37 (*)    All other components within normal limits  MAGNESIUM - Abnormal; Notable for the following components:   Magnesium 2.6 (*)    All other components within normal limits  PHOSPHORUS - Abnormal; Notable for the following components:   Phosphorus 11.4 (*)    All other components within normal limits  RAPID URINE DRUG SCREEN, HOSP PERFORMED - Abnormal; Notable for the following components:   Cocaine POSITIVE (*)    Tetrahydrocannabinol POSITIVE (*)    All other components within normal limits  TRIGLYCERIDES - Abnormal; Notable for the following components:   Triglycerides 186 (*)    All other components within normal limits  URINALYSIS, ROUTINE W REFLEX MICROSCOPIC - Abnormal; Notable for the following components:   APPearance HAZY (*)    Glucose, UA >=500 (*)    Hgb urine dipstick SMALL (*)    Protein, ur >=300 (*)    Bacteria, UA RARE (*)    All other components within normal limits  CBC - Abnormal; Notable for the following components:   WBC 13.4 (*)    Hemoglobin 19.4 (*)    HCT 57.1 (*)    All other components within normal limits  LACTIC ACID, PLASMA - Abnormal; Notable for the following  components:   Lactic Acid, Venous 4.2 (*)    All other components within normal limits  CBG MONITORING, ED - Abnormal; Notable for the following components:   Glucose-Capillary 286 (*)  All other components within normal limits  I-STAT ARTERIAL BLOOD GAS, ED - Abnormal; Notable for the following components:   pH, Arterial 7.224 (*)    pO2, Arterial 213 (*)    Bicarbonate 15.6 (*)    TCO2 17 (*)    Acid-base deficit 12.0 (*)    Calcium, Ion 1.12 (*)    Hemoglobin 17.7 (*)    All other components within normal limits  I-STAT ARTERIAL BLOOD GAS, ED - Abnormal; Notable for the following components:   pH, Arterial 7.249 (*)    pO2, Arterial 150 (*)    Bicarbonate 15.7 (*)    TCO2 17 (*)    Acid-base deficit 11.0 (*)    Calcium, Ion 1.14 (*)    HCT 57.0 (*)    Hemoglobin 19.4 (*)    All other components within normal limits  TROPONIN I (HIGH SENSITIVITY) - Abnormal; Notable for the following components:   Troponin I (High Sensitivity) 46 (*)    All other components within normal limits  TROPONIN I (HIGH SENSITIVITY) - Abnormal; Notable for the following components:   Troponin I (High Sensitivity) 147 (*)    All other components within normal limits  PROTIME-INR  PROCALCITONIN  BLOOD GAS, ARTERIAL  LACTIC ACID, PLASMA  LACTIC ACID, PLASMA  LACTIC ACID, PLASMA  HIV ANTIBODY (ROUTINE TESTING W REFLEX)  CREATININE, SERUM  LACTIC ACID, PLASMA  TYPE AND SCREEN  ABO/RH    EKG EKG Interpretation  Date/Time:  Monday June 05 2022 20:26:31 EST Ventricular Rate:  118 PR Interval:  145 QRS Duration: 68 QT Interval:  305 QTC Calculation: 428 R Axis:   83 Text Interpretation: Sinus tachycardia Right atrial enlargement Anterior infarct, old Minimal ST depression, diffuse leads No significant change since last tracing improved ST changes compared to paramedic ekg Confirmed by Varney Biles [phone]) on 06/05/2022 9:38:07 PM  Radiology CT HEAD WO CONTRAST  Result Date:  06/05/2022 CLINICAL DATA:  Found unresponsive after snorting substances. EXAM: CT HEAD WITHOUT CONTRAST TECHNIQUE: Contiguous axial images were obtained from the base of the skull through the vertex without intravenous contrast. RADIATION DOSE REDUCTION: This exam was performed according to the departmental dose-optimization program which includes automated exposure control, adjustment of the mA and/or kV according to patient size and/or use of iterative reconstruction technique. COMPARISON:  None Available. FINDINGS: Brain: There is no acute intracranial hemorrhage, extra-axial fluid collection, or acute infarct. Parenchymal volume is normal. The ventricles are normal in size. Gray-white differentiation appears preserved, without convincing evidence of anoxic brain injury There is no solid mass lesion. There is no mass effect or midline shift. Vascular: No hyperdense vessel or unexpected calcification. Skull: Choose Sinuses/Orbits: There is fluid in the sphenoid sinuses which may be related to instrumentation. The globes and orbits are unremarkable. Other: There is prominent periapical lucency around the imaged maxillary teeth. IMPRESSION: 1. No evidence of acute intracranial pathology. No convincing evidence of anoxic brain injury. 2. Extensive dental disease involving the imaged maxillary teeth. Electronically Signed   By: Valetta Mole M.D.   On: 06/05/2022 21:15   DG Abdomen 1 View  Result Date: 06/05/2022 CLINICAL DATA:  Orogastric tube EXAM: ABDOMEN - 1 VIEW COMPARISON:  None Available. FINDINGS: Orogastric tube tip at the level of the mid stomach. No dilated bowel loops are seen. IMPRESSION: Orogastric tube tip at the level of the mid stomach. Electronically Signed   By: Ronney Asters M.D.   On: 06/05/2022 20:55   DG Chest [facility] [address]  Result Date: 06/05/2022 CLINICAL DATA:  Intubated, OG tube. EXAM: PORTABLE CHEST 1 VIEW COMPARISON:  Chest x-ray 03/21/2021 FINDINGS: Endotracheal tube tip is 4.5 cm  above the carina. Enteric tube extends into the stomach, but distal tip is not included on the image. There is some minimal patchy opacities in the medial left lung base. The lungs are otherwise clear. No pleural effusion or pneumothorax. Cardiomediastinal silhouette is within normal limits. No acute fractures are seen. Are clear. The visualized skeletal structures are unremarkable. IMPRESSION: 1. Endotracheal tube tip is 4.5 cm above the carina. 2. Enteric tube extends into the stomach, but distal tip is not included on the image. 3. Minimal patchy opacities in the medial left lung base, possibly atelectasis or pneumonia. Electronically Signed   By: Ronney Asters M.D.   On: 06/05/2022 20:54    Procedures .Critical Care  Performed by: Phyllis Ginger, MD Authorized by: Varney Biles, MD   Critical care provider statement:    Critical care time (minutes):  35   Critical care start time:  06/05/2022 8:15 PM   Critical care time was exclusive of:  Separately billable procedures and treating other patients and teaching time   Critical care was necessary to treat or prevent imminent or life-threatening deterioration of the following conditions:  Cardiac failure and respiratory failure   Critical care was time spent personally by me on the following activities:  Ordering and performing treatments and interventions, ordering and review of laboratory studies, ordering and review of radiographic studies, pulse oximetry, re-evaluation of patient's condition, ventilator management, examination of patient and discussions with consultants   Care discussed with: admitting provider       Medications Ordered in ED Medications  fentaNYL (SUBLIMAZE) injection 50-200 mcg (150 mcg Intravenous Given 06/16/2022 0008)  propofol (DIPRIVAN) 1000 MG/100ML infusion (40 mcg/kg/min  60 kg (Order-Specific) Intravenous Rate/Dose Change 06/05/22 2101)  docusate (COLACE) 50 MG/5ML liquid 100 mg (has no administration in time  range)  polyethylene glycol (MIRALAX / GLYCOLAX) packet 17 g (has no administration in time range)  enoxaparin (LOVENOX) injection 40 mg (has no administration in time range)  fentaNYL (SUBLIMAZE) injection 50 mcg (50 mcg Intravenous Given 06/05/22 2245)  lactated ringers bolus 1,000 mL (0 mLs Intravenous Stopped 06/05/22 2210)    ED Course/ Medical Decision Making/ A&P                           Medical Decision Making Amount and/or Complexity of Data Reviewed Independent Historian: EMS Labs: ordered. Decision-making details documented in ED Course. Radiology: ordered and independent interpretation performed. Decision-making details documented in ED Course. ECG/medicine tests: ordered and independent interpretation performed. Decision-making details documented in ED Course. Discussion of management or test interpretation with external provider(s): Critical care  Risk Prescription drug management. Decision regarding hospitalization.  Critical Care Total time providing critical care: 35 minutes   Patient presenting after cardiac arrest. Suspected arrest from respiratory failure after drug intoxication. Initially PEA, briefly Vfib, then PEA until ROSC prior to hospital arrival. Intubated prior to arrival. Post ROSC ECG concerning for STEMI with ST elevations in leads aVR and V1, diffuse ST depressions.  On arrival, patient is intubated, initiating occasional spontaneous breaths. He is tachycardic around 120. ECG obtained which per my review shows sinus tachycardia. The ST changes prior to arrival have resolved. CXR obtained which showed intact ETT, KUB obtained which shows normal positioning of OG tube. CT head obtained, showing no evidence of intracranial injury/bleeding/hydrocephalus.  There is no significant blunting of the gray/white matter, no significant signs of anoxic brain injury.   Labs significant for positive cocaine and THC, significant lactic acidemia, signs of shock  liver/acute liver injury from his hypoxia, slightly elevated troponin at 46, likely secondary to cardiac arrest. Low suspicion for primary cardiac event. pH 7.22, arterial O2 is elevated at 213, will bring FiO2 down to 40%. Acidemia likely 2/2 lactic acidosis in the setting of cardiac arrest. Will give 1 L LR, continue trending troponin and lactic acid, and initiate propofol gtt with PRN fentanyl for pain and sedation.  The patient was admitted to the critical care service without any further acute events in the emergency department.  Final Clinical Impression(s) / ED Diagnoses Final diagnoses:  Cardiac arrest [facility])    Rx / DC Orders ED Discharge Orders     None         Gust Brooms, MD 06/17/2022 [id number]    Gust Brooms, MD 06/16/2022 7209    Derwood Kaplan, MD 05/20/2022 1340    Gust Brooms, MD 06/29/22 4709    Derwood Kaplan, MD 06/30/22 1614

## 2022-06-05 NOTE — H&P (Incomplete Revision)
NAME:  George Summers, MRN:  [id number], DOB:  [birth year], LOS: 0 ADMISSION DATE:  06/05/2022, CONSULTATION DATE:  06/05/22 REFERRING MD:  EDP, CHIEF COMPLAINT:  acute toxic encephalopthy    History of Present Illness:  41 yo male with unknown pmh presented to ed after reportedly "snorting" a line of unknown substance and sudden collapse. CPR was initiated 4-5 mins after by local fire department after he was noted to be in PEA, given epi and entered into vfib in which he was shocked. Reported approximate 20 min down time with 15 min of acls.    Upon arrival to ed pt was intubated, BP stable, on propofol only. He was having intermittent jerking.    All history is obtained from chart as pt is intubated and sedated   Pertinent  Medical History  H/o illicit substance abuse   Significant Hospital Events: Including procedures, antibiotic start and stop dates in addition to other pertinent events   Admitted to ICU 06/05/22   Interim History / Subjective:      Objective   Blood pressure (!) 140/100, pulse (!) 127, temperature (!) 96 F (35.6 C), temperature source Temporal, resp. rate (!) 28, height 5\' 9"  (1.753 m), weight 80 kg, SpO2 93 %.     >  Vent Mode: PRVC FiO2 (%):  [40 %] 40 % Set Rate:  [20 bmp] 20 bmp Vt Set:  [570 mL] 570 mL PEEP:  [5 cmH20] 5 cmH20    No intake or output data in the 24 hours ending 06/05/22 2310    Filed Weights    06/05/22 2025  Weight: 80 kg      Examination: General: sedated on propofol, otherwise unresponsive. Intermittent myoclonic appearing jerking  HENT: ncat, perrla but pinpoint, mmmp Lungs: ctab Cardiovascular: tachy/sinus Abdomen: soft non tender non distended Extremities: no c/c/e Neuro: sedated on propofol GU: deferred   Resolved Hospital Problem list       Assessment & Plan:  [place] s/p rosc:  -pea -myoclonic jerking at presentation -cth negative for anoxic injury -cont propofol -will pursue repeat imaging in next  24-48 hours -potential neuro consult in am, no emergent need at this time -eeg -normothermia but at time of my exam pt was already 35 degree celsius    Drug overdose:   -suspect unintentional -uds pending.    Myoclonus:   -eeg -propofol as needed -neuro consult in am.    Metabolic acidosis:  Lactic acidosis:  -volume for now -trend   Elevated trop:  -noted -2/2 cardiac arrest.  -cannot access EKG in system -will cont to trend and repeat EKG         Best Practice (right click and "Reselect all SmartList Selections" daily)    Diet/type: NPO w/ oral meds and NPO w/ meds via tube DVT prophylaxis: SCD GI prophylaxis: PPI Lines: N/A Foley:  N/A Code Status:  full code Last date of multidisciplinary goals of care discussion [pending discussion with family]   Labs   CBC: Last Labs      Recent Labs  Lab 06/05/22 2025 06/05/22 2109  WBC 8.6  --   HGB 15.6 17.7*  HCT 47.6 52.0  MCV 103.3*  --   PLT 188  --         Basic Metabolic Panel: Last Labs      Recent Labs  Lab 06/05/22 2025 06/05/22 2109  NA 138 136  K 3.8 3.9  CL 100  --   CO2 19*  --  GLUCOSE 327*  --   BUN 9  --   CREATININE 1.25*  --   CALCIUM 8.4*  --   MG 2.6*  --   PHOS 11.4*  --       GFR: Estimated Creatinine Clearance: 77 mL/min (A) (by C-G formula based on SCr of 1.25 mg/dL (H)). Last Labs     Recent Labs  Lab 06/05/22 2025  WBC 8.6  LATICACIDVEN 8.6*        Liver Function Tests: Last Labs     Recent Labs  Lab 06/05/22 2025  AST 414*  ALT 135*  ALKPHOS 164*  BILITOT 0.4  PROT 6.3*  ALBUMIN 3.5      Last Labs  No results for input(s): "LIPASE", "AMYLASE" in the last 168 hours.   Last Labs  No results for input(s): "AMMONIA" in the last 168 hours.     ABG Labs (Brief)          Component Value Date/Time    PHART 7.224 (L) 06/05/2022 2109    PCO2ART 37.1 06/05/2022 2109    PO2ART 213 (H) 06/05/2022 2109    HCO3 15.6 (L) 06/05/2022 2109    TCO2 17  (L) 06/05/2022 2109    ACIDBASEDEF 12.0 (H) 06/05/2022 2109    O2SAT 100 06/05/2022 2109        Coagulation Profile: Last Labs     Recent Labs  Lab 06/05/22 2025  INR 1.0        Cardiac Enzymes: Last Labs  No results for input(s): "CKTOTAL", "CKMB", "CKMBINDEX", "TROPONINI" in the last 168 hours.     HbA1C: Last Labs  No results found for: "HGBA1C"     CBG: Last Labs     Recent Labs  Lab 06/05/22 2040  GLUCAP 286*        Review of Systems:   Unobtainable 2/2 intubated and sedated status     Past Medical History:  He,  has a past medical history of Otitis (11/08/2020), Right knee injury (05/29/2012), Right leg injury (05/29/2012), and Unspecified perforation of tympanic membrane, unspecified ear (11/08/2020).    Surgical History:         Past Surgical History:  Procedure Laterality Date   BUNIONECTOMY       HERNIA REPAIR          Social History:   reports that he has never smoked. He has never used smokeless tobacco. He reports that he does not drink alcohol and does not use drugs.    Family History:  His family history includes Cancer in his father; Heart attack in his mother. There is no history of Sudden death, Hypertension, Hyperlipidemia, or Diabetes.    Allergies No Known Allergies    Home Medications         Prior to Admission medications   Medication Sig Start Date End Date Taking? Authorizing Provider  albuterol (VENTOLIN HFA) 108 (90 Base) MCG/ACT inhaler Inhale 1-2 puffs into the lungs every 4 (four) hours as needed for wheezing. 12/27/20     Early, Sung Amabile, NP  azithromycin (ZITHROMAX) 250 MG tablet Take 1 tablet (250 mg total) by mouth daily. Take 1 pill for the next four days. 06/27/21     Achille Rich, PA-C  fluticasone (FLONASE) 50 MCG/ACT nasal spray Place 2 sprays into both nostrils daily. 12/27/20     Early, Sung Amabile, NP  montelukast (SINGULAIR) 10 MG tablet Take 1 tablet (10 mg total) by mouth at bedtime. 12/27/20     Early,  Coralee Pesa, NP   pantoprazole (PROTONIX) 40 MG tablet Take 1 tablet (40 mg total) by mouth daily. 12/27/20     Orma Render, NP      Critical care time: 51min from 2200-2400

## 2022-06-05 NOTE — H&P (Incomplete Revision)
NAME:  George Summers, MRN:  [id number], DOB:  06/02/1981, LOS: 0 ADMISSION DATE:  06/05/2022, CONSULTATION DATE:  06/05/22 REFERRING MD:  EDP, CHIEF COMPLAINT:  acute toxic encephalopthy   History of Present Illness:  41 yo male with unknown pmh presented to ed after reportedly "snorting" a line of unknown substance and sudden collapse. CPR was initiated 4-5 mins after by local fire department after he was noted to be in PEA, given epi and entered into vfib in which he was shocked. Reported approximate 20 min down time with 15 min of acls.   Upon arrival to ed pt was intubated, BP stable, on propofol only. He was having intermittent jerking.   All history is obtained from chart as pt is intubated and sedated  Pertinent  Medical History  H/o illicit substance abuse  Significant Hospital Events: Including procedures, antibiotic start and stop dates in addition to other pertinent events   Admitted to ICU 06/05/22  Interim History / Subjective:    Objective   Blood pressure (!) 140/100, pulse (!) 127, temperature (!) 96 F (35.6 C), temperature source Temporal, resp. rate (!) 28, height 5\' 9"  (1.753 m), weight 80 kg, SpO2 93 %.    Vent Mode: PRVC FiO2 (%):  [40 %] 40 % Set Rate:  [20 bmp] 20 bmp Vt Set:  [570 mL] 570 mL PEEP:  [5 cmH20] 5 cmH20  No intake or output data in the 24 hours ending 06/05/22 2310 Filed Weights   06/05/22 2025  Weight: 80 kg    Examination: General: sedated on propofol, otherwise unresponsive. Intermittent myoclonic appearing jerking  HENT: ncat, perrla but pinpoint, mmmp Lungs: ctab Cardiovascular: tachy/sinus Abdomen: soft non tender non distended Extremities: no c/c/e Neuro: sedated on propofol GU: deferred  Resolved Hospital Problem list     Assessment & Plan:  [facility] s/p rosc:  -pea -myoclonic jerking at presentation -cth negative for anoxic injury -cont propofol -will pursue repeat imaging in next 24-48 hours -potential neuro  consult in am, no emergent need at this time -eeg -normothermia but at time of my exam pt was already 35 degree celsius   Drug overdose:   -suspect unintentional -uds pending.   Myoclonus:   -eeg -propofol as needed -neuro consult in am.   Metabolic acidosis:  Lactic acidosis:  -volume for now -trend  Elevated trop:  -noted -2/2 cardiac arrest.  -cannot access EKG in system -will cont to trend and repeat EKG     Best Practice (right click and "Reselect all SmartList Selections" daily)   Diet/type: NPO w/ oral meds and NPO w/ meds via tube DVT prophylaxis: SCD GI prophylaxis: PPI Lines: N/A Foley:  N/A Code Status:  full code Last date of multidisciplinary goals of care discussion [pending discussion with family]  Labs   CBC: Recent Labs  Lab 06/05/22 2025 06/05/22 2109  WBC 8.6  --   HGB 15.6 17.7*  HCT 47.6 52.0  MCV 103.3*  --   PLT 188  --     Basic Metabolic Panel: Recent Labs  Lab 06/05/22 2025 06/05/22 2109  NA 138 136  K 3.8 3.9  CL 100  --   CO2 19*  --   GLUCOSE 327*  --   BUN 9  --   CREATININE 1.25*  --   CALCIUM 8.4*  --   MG 2.6*  --   PHOS 11.4*  --    GFR: Estimated Creatinine Clearance: 77 mL/min (A) (by C-G formula based on  SCr of 1.25 mg/dL (H)). Recent Labs  Lab 06/05/22 2025  WBC 8.6  LATICACIDVEN 8.6*    Liver Function Tests: Recent Labs  Lab 06/05/22 2025  AST 414*  ALT 135*  ALKPHOS 164*  BILITOT 0.4  PROT 6.3*  ALBUMIN 3.5   No results for input(s): "LIPASE", "AMYLASE" in the last 168 hours. No results for input(s): "AMMONIA" in the last 168 hours.  ABG    Component Value Date/Time   PHART 7.224 (L) 06/05/2022 2109   PCO2ART 37.1 06/05/2022 2109   PO2ART 213 (H) 06/05/2022 2109   HCO3 15.6 (L) 06/05/2022 2109   TCO2 17 (L) 06/05/2022 2109   ACIDBASEDEF 12.0 (H) 06/05/2022 2109   O2SAT 100 06/05/2022 2109     Coagulation Profile: Recent Labs  Lab 06/05/22 2025  INR 1.0    Cardiac  Enzymes: No results for input(s): "CKTOTAL", "CKMB", "CKMBINDEX", "TROPONINI" in the last 168 hours.  HbA1C: No results found for: "HGBA1C"  CBG: Recent Labs  Lab 06/05/22 2040  GLUCAP 286*    Review of Systems:   Unobtainable 2/2 intubated and sedated status   Past Medical History:  He,  has a past medical history of Otitis (11/08/2020), Right knee injury (05/29/2012), Right leg injury (05/29/2012), and Unspecified perforation of tympanic membrane, unspecified ear (11/08/2020).   Surgical History:   Past Surgical History:  Procedure Laterality Date   BUNIONECTOMY     HERNIA REPAIR       Social History:   reports that he has never smoked. He has never used smokeless tobacco. He reports that he does not drink alcohol and does not use drugs.   Family History:  His family history includes Cancer in his father; Heart attack in his mother. There is no history of Sudden death, Hypertension, Hyperlipidemia, or Diabetes.   Allergies No Known Allergies   Home Medications  Prior to Admission medications   Medication Sig Start Date End Date Taking? Authorizing Provider  albuterol (VENTOLIN HFA) 108 (90 Base) MCG/ACT inhaler Inhale 1-2 puffs into the lungs every 4 (four) hours as needed for wheezing. 12/27/20   Early, Sung Amabile, NP  azithromycin (ZITHROMAX) 250 MG tablet Take 1 tablet (250 mg total) by mouth daily. Take 1 pill for the next four days. 06/27/21   Achille Rich, PA-C  fluticasone (FLONASE) 50 MCG/ACT nasal spray Place 2 sprays into both nostrils daily. 12/27/20   Early, Sung Amabile, NP  montelukast (SINGULAIR) 10 MG tablet Take 1 tablet (10 mg total) by mouth at bedtime. 12/27/20   Tollie Eth, NP  pantoprazole (PROTONIX) 40 MG tablet Take 1 tablet (40 mg total) by mouth daily. 12/27/20   Tollie Eth, NP     Critical care time: from 2200-2400

## 2022-06-05 NOTE — H&P (Addendum)
    NAME:  George Summers, MRN:  [id number], DOB:  12/27/1980, LOS: 0 ADMISSION DATE:  06/05/2022, CONSULTATION DATE:  06/05/22 REFERRING MD:  EDP, CHIEF COMPLAINT:  acute toxic encephalopthy    History of Present Illness:  41 yo male with unknown pmh presented to ed after reportedly "snorting" a line of unknown substance and sudden collapse. CPR was initiated 4-5 mins after by local fire department after he was noted to be in PEA, given epi and entered into vfib in which he was shocked. Reported approximate 20 min down time with 15 min of acls.    Upon arrival to ed pt was intubated, BP stable, on propofol only. He was having intermittent jerking.    All history is obtained from chart as pt is intubated and sedated   Pertinent  Medical History  H/o illicit substance abuse   Significant Hospital Events: Including procedures, antibiotic start and stop dates in addition to other pertinent events   Admitted to ICU 06/05/22   Interim History / Subjective:      Objective   Blood pressure (!) 140/100, pulse (!) 127, temperature (!) 96 F (35.6 C), temperature source Temporal, resp. rate (!) 28, height 5' 9" (1.753 m), weight 80 kg, SpO2 93 %.     >  Vent Mode: PRVC FiO2 (%):  [40 %] 40 % Set Rate:  [20 bmp] 20 bmp Vt Set:  [570 mL] 570 mL PEEP:  [5 cmH20] 5 cmH20    No intake or output data in the 24 hours ending 06/05/22 2310    Filed Weights    06/05/22 2025  Weight: 80 kg      Examination: General: sedated on propofol, otherwise unresponsive. Intermittent myoclonic appearing jerking  HENT: ncat, perrla but pinpoint, mmmp Lungs: ctab Cardiovascular: tachy/sinus Abdomen: soft non tender non distended Extremities: no c/c/e Neuro: sedated on propofol GU: deferred   Resolved Hospital Problem list       Assessment & Plan:  [place] s/p rosc:  -pea -myoclonic jerking at presentation -cth negative for anoxic injury -cont propofol -will pursue repeat imaging in next  24-48 hours -potential neuro consult in am, no emergent need at this time -eeg -normothermia but at time of my exam pt was already 35 degree celsius    Drug overdose:   -suspect unintentional -uds pending.    Myoclonus:   -eeg -propofol as needed -neuro consult in am.    Metabolic acidosis:  Lactic acidosis:  -volume for now -trend   Elevated trop:  -noted -2/2 cardiac arrest.  -cannot access EKG in system -will cont to trend and repeat EKG         Best Practice (right click and "Reselect all SmartList Selections" daily)    Diet/type: NPO w/ oral meds and NPO w/ meds via tube DVT prophylaxis: SCD GI prophylaxis: PPI Lines: N/A Foley:  N/A Code Status:  full code Last date of multidisciplinary goals of care discussion [pending discussion with family]   Labs   CBC: Last Labs      Recent Labs  Lab 06/05/22 2025 06/05/22 2109  WBC 8.6  --   HGB 15.6 17.7*  HCT 47.6 52.0  MCV 103.3*  --   PLT 188  --         Basic Metabolic Panel: Last Labs      Recent Labs  Lab 06/05/22 2025 06/05/22 2109  NA 138 136  K 3.8 3.9  CL 100  --   CO2 19*  --     GLUCOSE 327*  --   BUN 9  --   CREATININE 1.25*  --   CALCIUM 8.4*  --   MG 2.6*  --   PHOS 11.4*  --       GFR: Estimated Creatinine Clearance: 77 mL/min (A) (by C-G formula based on SCr of 1.25 mg/dL (H)). Last Labs     Recent Labs  Lab 06/05/22 2025  WBC 8.6  LATICACIDVEN 8.6*        Liver Function Tests: Last Labs     Recent Labs  Lab 06/05/22 2025  AST 414*  ALT 135*  ALKPHOS 164*  BILITOT 0.4  PROT 6.3*  ALBUMIN 3.5      Last Labs  No results for input(s): "LIPASE", "AMYLASE" in the last 168 hours.   Last Labs  No results for input(s): "AMMONIA" in the last 168 hours.     ABG Labs (Brief)          Component Value Date/Time    PHART 7.224 (L) 06/05/2022 2109    PCO2ART 37.1 06/05/2022 2109    PO2ART 213 (H) 06/05/2022 2109    HCO3 15.6 (L) 06/05/2022 2109    TCO2 17  (L) 06/05/2022 2109    ACIDBASEDEF 12.0 (H) 06/05/2022 2109    O2SAT 100 06/05/2022 2109        Coagulation Profile: Last Labs     Recent Labs  Lab 06/05/22 2025  INR 1.0        Cardiac Enzymes: Last Labs  No results for input(s): "CKTOTAL", "CKMB", "CKMBINDEX", "TROPONINI" in the last 168 hours.     HbA1C: Last Labs  No results found for: "HGBA1C"     CBG: Last Labs     Recent Labs  Lab 06/05/22 2040  GLUCAP 286*        Review of Systems:   Unobtainable 2/2 intubated and sedated status     Past Medical History:  He,  has a past medical history of Otitis (11/08/2020), Right knee injury (05/29/2012), Right leg injury (05/29/2012), and Unspecified perforation of tympanic membrane, unspecified ear (11/08/2020).    Surgical History:         Past Surgical History:  Procedure Laterality Date   BUNIONECTOMY       HERNIA REPAIR          Social History:   reports that he has never smoked. He has never used smokeless tobacco. He reports that he does not drink alcohol and does not use drugs.    Family History:  His family history includes Cancer in his father; Heart attack in his mother. There is no history of Sudden death, Hypertension, Hyperlipidemia, or Diabetes.    Allergies No Known Allergies    Home Medications         Prior to Admission medications   Medication Sig Start Date End Date Taking? Authorizing Provider  albuterol (VENTOLIN HFA) 108 (90 Base) MCG/ACT inhaler Inhale 1-2 puffs into the lungs every 4 (four) hours as needed for wheezing. 12/27/20     Early, Sung Amabile, NP  azithromycin (ZITHROMAX) 250 MG tablet Take 1 tablet (250 mg total) by mouth daily. Take 1 pill for the next four days. 06/27/21     Achille Rich, PA-C  fluticasone (FLONASE) 50 MCG/ACT nasal spray Place 2 sprays into both nostrils daily. 12/27/20     Early, Sung Amabile, NP  montelukast (SINGULAIR) 10 MG tablet Take 1 tablet (10 mg total) by mouth at bedtime. 12/27/20     Early,  Coralee Pesa, NP   pantoprazole (PROTONIX) 40 MG tablet Take 1 tablet (40 mg total) by mouth daily. 12/27/20     Orma Render, NP      Critical care time: 51min from 2200-2400

## 2022-06-06 ENCOUNTER — Inpatient Hospital Stay (HOSPITAL_COMMUNITY)
Admission: EM | Admit: 2022-06-06 | Discharge: 2022-06-19 | Disposition: E | Payer: Commercial Managed Care - HMO | Source: Ambulatory Visit | Attending: Pulmonary Disease | Admitting: Pulmonary Disease

## 2022-06-06 ENCOUNTER — Inpatient Hospital Stay (HOSPITAL_COMMUNITY): Payer: Commercial Managed Care - HMO

## 2022-06-06 DIAGNOSIS — F1410 Cocaine abuse, uncomplicated: Secondary | ICD-10-CM | POA: Diagnosis not present

## 2022-06-06 DIAGNOSIS — R4182 Altered mental status, unspecified: Secondary | ICD-10-CM

## 2022-06-06 DIAGNOSIS — I469 Cardiac arrest, cause unspecified: Secondary | ICD-10-CM

## 2022-06-06 DIAGNOSIS — G931 Anoxic brain damage, not elsewhere classified: Secondary | ICD-10-CM

## 2022-06-06 DIAGNOSIS — G9341 Metabolic encephalopathy: Secondary | ICD-10-CM | POA: Insufficient documentation

## 2022-06-06 DIAGNOSIS — J690 Pneumonitis due to inhalation of food and vomit: Secondary | ICD-10-CM | POA: Insufficient documentation

## 2022-06-06 DIAGNOSIS — J9601 Acute respiratory failure with hypoxia: Secondary | ICD-10-CM | POA: Diagnosis not present

## 2022-06-06 DIAGNOSIS — E440 Moderate protein-calorie malnutrition: Secondary | ICD-10-CM | POA: Insufficient documentation

## 2022-06-06 DIAGNOSIS — I5020 Unspecified systolic (congestive) heart failure: Secondary | ICD-10-CM

## 2022-06-06 DIAGNOSIS — R7989 Other specified abnormal findings of blood chemistry: Secondary | ICD-10-CM

## 2022-06-06 DIAGNOSIS — G253 Myoclonus: Secondary | ICD-10-CM | POA: Insufficient documentation

## 2022-06-06 DIAGNOSIS — T50901A Poisoning by unspecified drugs, medicaments and biological substances, accidental (unintentional), initial encounter: Principal | ICD-10-CM | POA: Diagnosis present

## 2022-06-06 DIAGNOSIS — E872 Acidosis, unspecified: Secondary | ICD-10-CM

## 2022-06-06 DIAGNOSIS — N179 Acute kidney failure, unspecified: Secondary | ICD-10-CM | POA: Insufficient documentation

## 2022-06-06 DIAGNOSIS — R570 Cardiogenic shock: Secondary | ICD-10-CM | POA: Insufficient documentation

## 2022-06-06 LAB — LACTIC ACID, PLASMA
Lactic Acid, Venous: 4.2 mmol/L (ref 0.5–1.9)
Lactic Acid, Venous: 2.1 mmol/L (ref 0.5–1.9)
Lactic Acid, Venous: 2.6 mmol/L (ref 0.5–1.9)
Lactic Acid, Venous: 3.7 mmol/L (ref 0.5–1.9)
Lactic Acid, Venous: 4.5 mmol/L (ref 0.5–1.9)
Lactic Acid, Venous: 2.8 mmol/L (ref 0.5–1.9)
Lactic Acid, Venous: 7.3 mmol/L (ref 0.5–1.9)

## 2022-06-06 LAB — POCT I-STAT 7, (LYTES, BLD GAS, ICA,H+H)
Acid-base deficit: 7 mmol/L — ABNORMAL HIGH (ref 0.0–2.0)
Acid-base deficit: 6 mmol/L — ABNORMAL HIGH (ref 0.0–2.0)
Bicarbonate: 18.1 mmol/L — ABNORMAL LOW (ref 20.0–28.0)
Bicarbonate: 20.3 mmol/L (ref 20.0–28.0)
Calcium, Ion: 1.01 mmol/L — ABNORMAL LOW (ref 1.15–1.40)
Calcium, Ion: 1.03 mmol/L — ABNORMAL LOW (ref 1.15–1.40)
HCT: 58 % — ABNORMAL HIGH (ref 39.0–52.0)
HCT: 53 % — ABNORMAL HIGH (ref 39.0–52.0)
Hemoglobin: 19.7 g/dL — ABNORMAL HIGH (ref 13.0–17.0)
Hemoglobin: 18 g/dL — ABNORMAL HIGH (ref 13.0–17.0)
O2 Saturation: 97 %
O2 Saturation: 98 %
Patient temperature: 37.8
Patient temperature: 35.1
Potassium: 6.1 mmol/L — ABNORMAL HIGH (ref 3.5–5.1)
Potassium: 5.3 mmol/L — ABNORMAL HIGH (ref 3.5–5.1)
Sodium: 137 mmol/L (ref 135–145)
Sodium: 137 mmol/L (ref 135–145)
TCO2: 19 mmol/L — ABNORMAL LOW (ref 22–32)
TCO2: 22 mmol/L (ref 22–32)
pCO2 arterial: 37.6 mmHg (ref 32–48)
pCO2 arterial: 37.8 mmHg (ref 32–48)
pH, Arterial: 7.296 — ABNORMAL LOW (ref 7.35–7.45)
pH, Arterial: 7.329 — ABNORMAL LOW (ref 7.35–7.45)
pO2, Arterial: 109 mmHg — ABNORMAL HIGH (ref 83–108)
pO2, Arterial: 105 mmHg (ref 83–108)

## 2022-06-06 LAB — CBC
HCT: 54.6 % — ABNORMAL HIGH (ref 39.0–52.0)
Hemoglobin: 18.8 g/dL — ABNORMAL HIGH (ref 13.0–17.0)
MCH: 34 pg (ref 26.0–34.0)
MCHC: 34.4 g/dL (ref 30.0–36.0)
MCV: 98.7 fL (ref 80.0–100.0)
Platelets: 166 10*3/uL (ref 150–400)
RBC: 5.53 MIL/uL (ref 4.22–5.81)
RDW: 14.3 % (ref 11.5–15.5)
WBC: 27 10*3/uL — ABNORMAL HIGH (ref 4.0–10.5)
nRBC: 0 % (ref 0.0–0.2)

## 2022-06-06 LAB — COMPREHENSIVE METABOLIC PANEL
ALT: 164 U/L — ABNORMAL HIGH (ref 0–44)
AST: 428 U/L — ABNORMAL HIGH (ref 15–41)
Albumin: 3.2 g/dL — ABNORMAL LOW (ref 3.5–5.0)
Alkaline Phosphatase: 121 U/L (ref 38–126)
Anion gap: 17 — ABNORMAL HIGH (ref 5–15)
BUN: 29 mg/dL — ABNORMAL HIGH (ref 6–20)
CO2: 20 mmol/L — ABNORMAL LOW (ref 22–32)
Calcium: 7.6 mg/dL — ABNORMAL LOW (ref 8.9–10.3)
Chloride: 103 mmol/L (ref 98–111)
Creatinine, Ser: 2.46 mg/dL — ABNORMAL HIGH (ref 0.61–1.24)
GFR, Estimated: 33 mL/min — ABNORMAL LOW (ref >60)
Glucose, Bld: 121 mg/dL — ABNORMAL HIGH (ref 70–99)
Potassium: 6.1 mmol/L — ABNORMAL HIGH (ref 3.5–5.1)
Sodium: 140 mmol/L (ref 135–145)
Total Bilirubin: 0.6 mg/dL (ref 0.3–1.2)
Total Protein: 6.3 g/dL — ABNORMAL LOW (ref 6.5–8.1)

## 2022-06-06 LAB — ECHOCARDIOGRAM COMPLETE
AR max vel: 2.94 cm2
AV Area VTI: 2.67 cm2
AV Area mean vel: 2.66 cm2
AV Mean grad: 2 mmHg
AV Peak grad: 3 mmHg
Ao pk vel: 0.87 m/s
Height: 69 in
S' Lateral: 3.2 cm
Single Plane A4C EF: 33.6 %
Weight: 2116.42 oz

## 2022-06-06 LAB — BASIC METABOLIC PANEL
Anion gap: 18 — ABNORMAL HIGH (ref 5–15)
BUN: 36 mg/dL — ABNORMAL HIGH (ref 6–20)
CO2: 15 mmol/L — ABNORMAL LOW (ref 22–32)
Calcium: 7 mg/dL — ABNORMAL LOW (ref 8.9–10.3)
Chloride: 105 mmol/L (ref 98–111)
Creatinine, Ser: 2.74 mg/dL — ABNORMAL HIGH (ref 0.61–1.24)
GFR, Estimated: 29 mL/min — ABNORMAL LOW (ref >60)
Glucose, Bld: 140 mg/dL — ABNORMAL HIGH (ref 70–99)
Potassium: 4.4 mmol/L (ref 3.5–5.1)
Sodium: 138 mmol/L (ref 135–145)

## 2022-06-06 LAB — TROPONIN I (HIGH SENSITIVITY)
Troponin I (High Sensitivity): 147 ng/L (ref <18)
Troponin I (High Sensitivity): 655 ng/L (ref <18)
Troponin I (High Sensitivity): 375 ng/L (ref <18)
Troponin I (High Sensitivity): 375 ng/L (ref <18)
Troponin I (High Sensitivity): 178 ng/L (ref <18)
Troponin I (High Sensitivity): 155 ng/L (ref <18)

## 2022-06-06 LAB — PROCALCITONIN
Procalcitonin: 0.1 ng/mL
Procalcitonin: 53.33 ng/mL

## 2022-06-06 LAB — I-STAT ARTERIAL BLOOD GAS, ED
Acid-base deficit: 11 mmol/L — ABNORMAL HIGH (ref 0.0–2.0)
Bicarbonate: 15.7 mmol/L — ABNORMAL LOW (ref 20.0–28.0)
Calcium, Ion: 1.14 mmol/L — ABNORMAL LOW (ref 1.15–1.40)
HCT: 57 % — ABNORMAL HIGH (ref 39.0–52.0)
Hemoglobin: 19.4 g/dL — ABNORMAL HIGH (ref 13.0–17.0)
O2 Saturation: 99 %
Patient temperature: 96.5
Potassium: 3.9 mmol/L (ref 3.5–5.1)
Sodium: 136 mmol/L (ref 135–145)
TCO2: 17 mmol/L — ABNORMAL LOW (ref 22–32)
pCO2 arterial: 35.4 mmHg (ref 32–48)
pH, Arterial: 7.249 — ABNORMAL LOW (ref 7.35–7.45)
pO2, Arterial: 150 mmHg — ABNORMAL HIGH (ref 83–108)

## 2022-06-06 LAB — GLUCOSE, CAPILLARY
Glucose-Capillary: 111 mg/dL — ABNORMAL HIGH (ref 70–99)
Glucose-Capillary: 131 mg/dL — ABNORMAL HIGH (ref 70–99)
Glucose-Capillary: 134 mg/dL — ABNORMAL HIGH (ref 70–99)
Glucose-Capillary: 135 mg/dL — ABNORMAL HIGH (ref 70–99)
Glucose-Capillary: 142 mg/dL — ABNORMAL HIGH (ref 70–99)
Glucose-Capillary: 165 mg/dL — ABNORMAL HIGH (ref 70–99)

## 2022-06-06 LAB — CREATININE, SERUM
Creatinine, Ser: 1.1 mg/dL (ref 0.61–1.24)
GFR, Estimated: >60 mL/min (ref >60)

## 2022-06-06 LAB — PHOSPHORUS: Phosphorus: 4.9 mg/dL — ABNORMAL HIGH (ref 2.5–4.6)

## 2022-06-06 LAB — MRSA NEXT GEN BY PCR, NASAL: MRSA by PCR Next Gen: NOT DETECTED

## 2022-06-06 LAB — TRIGLYCERIDES: Triglycerides: 186 mg/dL — ABNORMAL HIGH (ref <150)

## 2022-06-06 LAB — CK: Total CK: 259 U/L (ref 49–397)

## 2022-06-06 LAB — ABO/RH: ABO/RH(D): O POS

## 2022-06-06 LAB — MAGNESIUM: Magnesium: 1.5 mg/dL — ABNORMAL LOW (ref 1.7–2.4)

## 2022-06-06 LAB — HIV ANTIBODY (ROUTINE TESTING W REFLEX): HIV Screen 4th Generation wRfx: NONREACTIVE

## 2022-06-06 MED ORDER — LACTATED RINGERS IV SOLN: INTRAVENOUS | Status: DC

## 2022-06-06 MED ORDER — ORAL CARE MOUTH RINSE: 15.0000 mL | OROMUCOSAL | Status: DC | PRN

## 2022-06-06 MED ORDER — VANCOMYCIN HCL 750 MG/150ML IV SOLN: 750.0000 mg | INTRAVENOUS | Status: DC

## 2022-06-06 MED ORDER — PROSOURCE TF20 ENFIT COMPATIBL EN LIQD
60.0000 mL | Freq: Every day | ENTERAL | Status: DC
  Administered 2022-06-06 – 2022-06-09 (×4): 60 mL
  Filled 2022-06-06 (×4): qty 60

## 2022-06-06 MED ORDER — DEXTROSE 50 % IV SOLN
1.0000 | Freq: Once | INTRAVENOUS | Status: AC
  Administered 2022-06-06: 50 mL via INTRAVENOUS
  Filled 2022-06-06: qty 50

## 2022-06-06 MED ORDER — CHLORHEXIDINE GLUCONATE CLOTH 2 % EX PADS
6.0000 | MEDICATED_PAD | Freq: Every day | CUTANEOUS | Status: DC
  Administered 2022-06-06: 6 via TOPICAL

## 2022-06-06 MED ORDER — POLYETHYLENE GLYCOL 3350 17 G PO PACK
17.0000 g | PACK | Freq: Every day | ORAL | Status: DC
  Administered 2022-06-06: 17 g
  Filled 2022-06-06: qty 1

## 2022-06-06 MED ORDER — FAMOTIDINE 20 MG PO TABS
20.0000 mg | ORAL_TABLET | Freq: Two times a day (BID) | ORAL | Status: DC
  Administered 2022-06-06: 20 mg
  Filled 2022-06-06: qty 1

## 2022-06-06 MED ORDER — FENTANYL 2500MCG IN NS 250ML (10MCG/ML) PREMIX INFUSION
50.0000 ug/h | INTRAVENOUS | Status: DC
  Administered 2022-06-06: 50 ug/h via INTRAVENOUS
  Administered 2022-06-07: 125 ug/h via INTRAVENOUS
  Administered 2022-06-09: 200 ug/h via INTRAVENOUS
  Administered 2022-06-09: 150 ug/h via INTRAVENOUS
  Filled 2022-06-06 (×3): qty 250

## 2022-06-06 MED ORDER — THIAMINE MONONITRATE 100 MG PO TABS
100.0000 mg | ORAL_TABLET | Freq: Every day | ORAL | Status: DC
  Administered 2022-06-06 – 2022-06-09 (×4): 100 mg
  Filled 2022-06-06 (×4): qty 1

## 2022-06-06 MED ORDER — PROPOFOL 1000 MG/100ML IV EMUL
5.0000 ug/kg/min | INTRAVENOUS | Status: DC
  Administered 2022-06-06: 50 ug/kg/min via INTRAVENOUS
  Filled 2022-06-06: qty 100

## 2022-06-06 MED ORDER — SODIUM CHLORIDE 0.9 % IV SOLN
250.0000 mL | INTRAVENOUS | Status: DC
  Administered 2022-06-06 (×2): 250 mL via INTRAVENOUS

## 2022-06-06 MED ORDER — INSULIN ASPART 100 UNIT/ML IV SOLN
10.0000 [IU] | Freq: Once | INTRAVENOUS | Status: AC
  Administered 2022-06-06: 10 [IU] via INTRAVENOUS

## 2022-06-06 MED ORDER — LEVETIRACETAM IN NACL 500 MG/100ML IV SOLN
500.0000 mg | Freq: Two times a day (BID) | INTRAVENOUS | Status: DC
  Administered 2022-06-06 – 2022-06-09 (×6): 500 mg via INTRAVENOUS
  Filled 2022-06-06 (×6): qty 100

## 2022-06-06 MED ORDER — FENTANYL BOLUS VIA INFUSION: 50.0000 ug | INTRAVENOUS | Status: DC | PRN

## 2022-06-06 MED ORDER — BUSPIRONE HCL 15 MG PO TABS: 30.0000 mg | ORAL_TABLET | Freq: Three times a day (TID) | ORAL | Status: DC | PRN

## 2022-06-06 MED ORDER — ADULT MULTIVITAMIN W/MINERALS CH
1.0000 | ORAL_TABLET | Freq: Every day | ORAL | Status: DC
  Administered 2022-06-06 – 2022-06-09 (×4): 1
  Filled 2022-06-06 (×4): qty 1

## 2022-06-06 MED ORDER — MIDAZOLAM HCL 2 MG/2ML IJ SOLN
1.0000 mg | INTRAMUSCULAR | Status: DC | PRN
  Administered 2022-06-06: 1 mg via INTRAVENOUS
  Filled 2022-06-06: qty 2

## 2022-06-06 MED ORDER — FENTANYL CITRATE PF 50 MCG/ML IJ SOSY: 50.0000 ug | PREFILLED_SYRINGE | Freq: Once | INTRAMUSCULAR | Status: AC

## 2022-06-06 MED ORDER — ORAL CARE MOUTH RINSE
15.0000 mL | OROMUCOSAL | Status: DC
  Administered 2022-06-06 (×2): 15 mL via OROMUCOSAL

## 2022-06-06 MED ORDER — ONDANSETRON HCL 4 MG/2ML IJ SOLN: 4.0000 mg | Freq: Four times a day (QID) | INTRAMUSCULAR | Status: DC | PRN

## 2022-06-06 MED ORDER — SODIUM ZIRCONIUM CYCLOSILICATE 10 G PO PACK
10.0000 g | PACK | Freq: Once | ORAL | Status: AC
  Administered 2022-06-06: 10 g
  Filled 2022-06-06: qty 1

## 2022-06-06 MED ORDER — FENTANYL BOLUS VIA INFUSION
50.0000 ug | INTRAVENOUS | Status: DC | PRN
  Administered 2022-06-08 – 2022-06-09 (×6): 100 ug via INTRAVENOUS

## 2022-06-06 MED ORDER — LEVETIRACETAM IN NACL 500 MG/100ML IV SOLN
500.0000 mg | Freq: Two times a day (BID) | INTRAVENOUS | Status: DC
  Administered 2022-06-06: 500 mg via INTRAVENOUS
  Filled 2022-06-06: qty 100

## 2022-06-06 MED ORDER — ORAL CARE MOUTH RINSE
15.0000 mL | OROMUCOSAL | Status: DC
  Administered 2022-06-06 – 2022-06-09 (×35): 15 mL via OROMUCOSAL

## 2022-06-06 MED ORDER — SODIUM BICARBONATE 8.4 % IV SOLN
50.0000 meq | Freq: Once | INTRAVENOUS | Status: AC
  Administered 2022-06-06: 50 meq via INTRAVENOUS
  Filled 2022-06-06: qty 50

## 2022-06-06 MED ORDER — SODIUM CHLORIDE 0.9 % IV SOLN
250.0000 mL | INTRAVENOUS | Status: DC
  Administered 2022-06-06: 250 mL via INTRAVENOUS

## 2022-06-06 MED ORDER — DOCUSATE SODIUM 50 MG/5ML PO LIQD
100.0000 mg | Freq: Two times a day (BID) | ORAL | Status: DC
  Administered 2022-06-06 – 2022-06-08 (×4): 100 mg
  Filled 2022-06-06 (×4): qty 10

## 2022-06-06 MED ORDER — DOCUSATE SODIUM 50 MG/5ML PO LIQD
100.0000 mg | Freq: Two times a day (BID) | ORAL | Status: DC
  Administered 2022-06-06: 100 mg
  Filled 2022-06-06: qty 10

## 2022-06-06 MED ORDER — POLYETHYLENE GLYCOL 3350 17 G PO PACK
17.0000 g | PACK | Freq: Every day | ORAL | Status: DC
  Administered 2022-06-07 – 2022-06-08 (×2): 17 g
  Filled 2022-06-06 (×2): qty 1

## 2022-06-06 MED ORDER — NOREPINEPHRINE 4 MG/250ML-% IV SOLN: 2.0000 ug/min | INTRAVENOUS | Status: DC

## 2022-06-06 MED ORDER — CHLORHEXIDINE GLUCONATE CLOTH 2 % EX PADS
6.0000 | MEDICATED_PAD | Freq: Every day | CUTANEOUS | Status: DC
  Administered 2022-06-07 – 2022-06-09 (×4): 6 via TOPICAL

## 2022-06-06 MED ORDER — FENTANYL 2500MCG IN NS 250ML (10MCG/ML) PREMIX INFUSION
50.0000 ug/h | INTRAVENOUS | Status: DC
  Administered 2022-06-06: 50 ug/h via INTRAVENOUS
  Filled 2022-06-06: qty 250

## 2022-06-06 MED ORDER — PERFLUTREN LIPID MICROSPHERE
1.0000 mL | INTRAVENOUS | Status: AC | PRN
  Administered 2022-06-06: 3 mL via INTRAVENOUS

## 2022-06-06 MED ORDER — ALBUTEROL SULFATE (2.5 MG/3ML) 0.083% IN NEBU
10.0000 mg | INHALATION_SOLUTION | Freq: Once | RESPIRATORY_TRACT | Status: AC
  Administered 2022-06-06: 10 mg via RESPIRATORY_TRACT
  Filled 2022-06-06: qty 12

## 2022-06-06 MED ORDER — SODIUM CHLORIDE 0.9 % IV SOLN: INTRAVENOUS | Status: DC | PRN

## 2022-06-06 MED ORDER — MAGNESIUM SULFATE 2 GM/50ML IV SOLN: 2.0000 g | Freq: Once | INTRAVENOUS | Status: DC | PRN

## 2022-06-06 MED ORDER — ENOXAPARIN SODIUM 40 MG/0.4ML IJ SOSY: 40.0000 mg | PREFILLED_SYRINGE | INTRAMUSCULAR | Status: DC

## 2022-06-06 MED ORDER — MAGNESIUM SULFATE 2 GM/50ML IV SOLN
2.0000 g | Freq: Once | INTRAVENOUS | Status: AC
  Administered 2022-06-06: 2 g via INTRAVENOUS
  Filled 2022-06-06: qty 50

## 2022-06-06 MED ORDER — LACTATED RINGERS IV BOLUS
1000.0000 mL | Freq: Once | INTRAVENOUS | Status: AC
  Administered 2022-06-06: 1000 mL via INTRAVENOUS

## 2022-06-06 MED ORDER — SODIUM CHLORIDE 0.9 % IV SOLN
3000.0000 mg | INTRAVENOUS | Status: AC
  Administered 2022-06-06: 3000 mg via INTRAVENOUS
  Filled 2022-06-06: qty 30

## 2022-06-06 MED ORDER — PIPERACILLIN-TAZOBACTAM 3.375 G IVPB
3.3750 g | Freq: Three times a day (TID) | INTRAVENOUS | Status: DC
  Administered 2022-06-06 – 2022-06-09 (×10): 3.375 g via INTRAVENOUS
  Filled 2022-06-06 (×10): qty 50

## 2022-06-06 MED ORDER — NOREPINEPHRINE 4 MG/250ML-% IV SOLN
2.0000 ug/min | INTRAVENOUS | Status: DC
  Filled 2022-06-06: qty 250

## 2022-06-06 MED ORDER — VITAL 1.5 CAL PO LIQD
1000.0000 mL | ORAL | Status: DC
  Administered 2022-06-06 – 2022-06-09 (×5): 1000 mL

## 2022-06-06 MED ORDER — PROPOFOL 1000 MG/100ML IV EMUL
5.0000 ug/kg/min | INTRAVENOUS | Status: DC
  Administered 2022-06-06 – 2022-06-07 (×5): 50 ug/kg/min via INTRAVENOUS
  Filled 2022-06-06 (×6): qty 100

## 2022-06-06 MED ORDER — VANCOMYCIN HCL 1250 MG/250ML IV SOLN
1250.0000 mg | Freq: Once | INTRAVENOUS | Status: AC
  Administered 2022-06-06: 1250 mg via INTRAVENOUS
  Filled 2022-06-06: qty 250

## 2022-06-06 MED ORDER — LORAZEPAM 2 MG/ML IJ SOLN
4.0000 mg | INTRAMUSCULAR | Status: AC
  Administered 2022-06-06: 4 mg via INTRAVENOUS
  Filled 2022-06-06: qty 2

## 2022-06-06 NOTE — Progress Notes (Addendum)
eLink Physician-Brief Progress Note Patient Name: George Summers DOB: 02-Jan-1981 MRN: [id number]   Date of Service  06/10/2022  HPI/Events of Note  Multiple issues: 1. Nursing request for bilateral wrist restraints. 2. Nursing reportss myoclonic activity. 3. Nursing asking if there are plans for a neurology consultation or TTM. H&P not complete yet in Epic; therefore, there is little to provide direction for thinking of admitting team.   eICU Interventions  Plan: Bilateral soft wrist restraints X 6 hours. Keppra 500 mg IV now and Q 12 hours. Increase ceiling on Propofol IV infusion to 80 mcg/kg/min. Titrate as needed to control myoclonus.  Versed 1 mg IV Q 2 hours PRN seizures or myoclonus.  Will ask PCCM admitting team about plans for Neurology consultation or TTM.      Intervention Category Major Interventions: Delirium, psychosis, severe agitation - evaluation and management;Other:  [name] 05/27/2022, 4:45 AM

## 2022-06-06 NOTE — Progress Notes (Addendum)
Neurology Progress Note Brief HPI: George Summers is a 41 y.o. male who was with friends when he snorted a line of an white substance (UDS+ cocaine) and then had a witnessed cardiac arrest.  Fire department arrived 4 to 5 minutes after the call and started CPR he was then PEA then went into V-fib and was shocked with ROSC is noted at 1935, CPR time was 15 minutes, downtime at least 20 minutes by report. After arrival to [facility] ED, he was noted to have jerking movements. He was not started on TTM, but is on cooling machine to obtain normothermia.   Interval History: Stat cEEG started this am after change of shift, this has showed myoclonic seizures, currently in suppression d/t propofol. Will lessen in effort to burst suppress. On exam, he is GCS3t w/no response to noxious stimuli; there is myoclonic upward jerks noted in eyes to induction of light and very fait myoclonus noted in glabella.   Exam: Current vital signs: BP 98/75   Pulse (!) 101   Temp (!) 95.4 F (35.2 C)   Resp 19   Ht 5\' 9"  (1.753 m)   Wt 62.9 kg   SpO2 99%   BMI 20.48 kg/m  Vital signs in last 24 hours: Temp:  [94.6 F (34.8 C)-102.2 F (39 C)] 95.4 F (35.2 C) (12/19 0900) Pulse Rate:  [101-138] 101 (12/19 0800) Resp:  [12-33] 19 (12/19 0900) BP: (91-151)/(62-109) 98/75 (12/19 0900) SpO2:  [91 %-100 %] 99 % (12/19 0800) FiO2 (%):  [40 %-60 %] 50 % (12/19 0335) Weight:  [62.9 kg-80 kg] 62.9 kg (12/19 0358)   Physical Exam  In bed, intubated, unresponsive.  Neuro: Mental Status: GCS3t: Does not open eyes or follow commands in response to noxious stimulation, with forced eye opening, with induction to light there is an induced myoclonic-like upward jerks in eyes. fait myoclonus noted in glabella. Pupils: 49mm, equal round and sluggish reaction to light. Cough: present. Gag: absent Corneal: present. No response to noxious stimuli in any of the extremities.  I have reviewed the labs and images obtained: CT head -  no definite finding  Impression: 41 year old male with UDS+ cocaine who arrived to [facility] ED for witnessed cardiac arrest after snorting cocaine. Neurology consulted for post cardiac arrest jerking. 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 He is not undergoing TTM, but is on cooling machine for normothermia. Per AHA guidelines post arrest prognostication, will defer MRI brain for now.  Recommendations/Plan: Supportive care Cont Keppra 3 g BID IV Cont Versed gtt 1mg /hr Decrease propofol to 50 mcg/kg/min for burst suppression. Continuous EEG Consider MRI brain in 3-5 days if now improvement or pending GOC d/w family Neurology will follow   [facility], PhDc, ARNP-C, ANVP-BC Please contact via AMION [facility]  NEUROHOSPITALIST ADDENDUM Performed a face to face diagnostic evaluation.    I have reviewed the contents of history and physical exam as documented by PA/ARNP/Resident and agree with above documentation.  I have discussed and formulated the above plan as documented. Edits to the note have been made as needed.  This patient is critically ill and at significant risk of neurological worsening, death and care requires constant monitoring of vital signs, hemodynamics,respiratory and cardiac monitoring, neurological assessment, discussion with family, other specialists and medical decision making of high complexity. I spent 35 minutes of neurocritical care time  in the care of  this patient.   , MD [facility] Shon Baton  If 7pm to 7am, please call on call as listed on AMION.

## 2022-06-06 NOTE — Progress Notes (Signed)
STAT LTM EEG hooked up and running - no initial skin breakdown - push button tested - [facility] monitoring.  

## 2022-06-06 NOTE — Progress Notes (Signed)
Pharmacy Antibiotic Note  George Summers is a 41 y.o. male admitted on 06/07/2022 with sepsis.  Pharmacy has been consulted for vancomycin dosing. Wbc 27 Tm 102 Cr 2.4 crcl approx 81ml/min Also on zosyn 3.375gm IV q8h EI  Plan: Vancomycin 1250mg  IV x1 then 750mg  q24h  Weight: 60 kg (132 lb 4.4 oz)  Temp (24hrs), Avg:98.6 F (37 C), Min:94.6 F (34.8 C), Max:102.2 F (39 C)  Recent Labs  Lab 06/05/22 2025 06/05/22 2320 06/08/2022 0225 06/07/2022 0553 05/23/2022 1238 05/20/2022 1508 06/12/2022 1726  WBC 8.6 13.4*  --   --  27.0*  --   --   CREATININE 1.25* 1.10  --   --   --   --  2.46*  LATICACIDVEN 8.6* 4.2* 2.1* 2.6* 3.7* 4.5*  --     Estimated Creatinine Clearance: 34.2 mL/min (A) (by C-G formula based on SCr of 2.46 mg/dL (H)).    No Known Allergies  Antimicrobials this admission:  Dose adjustments this admission:   Microbiology results: 12/18 BCx:   06/08/22 Pharm.D. CPP, BCPS Clinical Pharmacist [phone] 06/03/2022 7:07 PM

## 2022-06-06 NOTE — Progress Notes (Signed)
PCCM INTERVAL PROGRESS NOTE  Labs resulting after spurious results earlier today.   K 6.1 Mag 1.5 WBC 27 PCT 53 LA slowly trending up Trop 375 and stable  Insulin, bicarb, Lokelma, albuterol Give mag Start empiric antibiotics, source unclear 1L LR bolus  Elink will follow labs   Joneen Roach, AGACNP-BC [facility]  See Amion for personal pager PCCM on call pager [phone] until 7pm. Please call Elink 7p-7a. [phone]  05/24/2022 6:37 PM

## 2022-06-06 NOTE — Consult Note (Addendum)
Neurology Consultation Reason for Consult: Concern for seizure Referring Physician: Laural Benes  CC: Seizure  History is obtained from: Patient  HPI: George Summers is a 41 y.o. male who was with friends when he snorted a line of an unknown substance(not clear to me if unknown to him, or just unknown to [place]), and then was seen to have a witnessed cardiac arrest.  Fire department arrived 4 to 5 minutes after the call and started CPR he was then PEA then went into V-fib and was shocked.  ROSC is noted at 1935, CPR time was 15 minutes, downtime at least 20 minutes depending on how long it took them to call initially.  Since arrival there have been notes of intermittent twitching activity.   Past Medical History:  Diagnosis Date   Otitis 11/08/2020   Right knee injury 05/29/2012   Right leg injury 05/29/2012   Unspecified perforation of tympanic membrane, unspecified ear 11/08/2020     Family History  Problem Relation Age of Onset   Heart attack Mother    Cancer Father    Sudden death Neg Hx    Hypertension Neg Hx    Hyperlipidemia Neg Hx    Diabetes Neg Hx      Social History:  reports that he has never smoked. He has never used smokeless tobacco. He reports that he does not drink alcohol and does not use drugs.   Exam: Current vital signs: BP 100/79   Pulse (!) 117   Temp 99.7 F (37.6 C)   Resp 17   Ht 5\' 9"  (1.753 m)   Wt 62.9 kg   SpO2 100%   BMI 20.48 kg/m  Vital signs in last 24 hours: Temp:  [95.7 F (35.4 C)-102 F (38.9 C)] 99.7 F (37.6 C) (12/19 0645) Pulse Rate:  [117-138] 117 (12/19 0645) Resp:  [12-33] 17 (12/19 0645) BP: (91-151)/(62-109) 100/79 (12/19 0645) SpO2:  [91 %-100 %] 100 % (12/19 0645) FiO2 (%):  [40 %-60 %] 50 % (12/19 0335) Weight:  [62.9 kg-80 kg] 62.9 kg (12/19 0358)   Physical Exam  In bed, intubated  Neuro: Mental Status: Does not open eyes or follow commands in response to noxious stimulation, he does occasionally have his  eyes open with an upward eye bob. Cranial Nerves: II: Pupils are equal, round, and reactive to light.   III,IV, VI: no response to dolls, intermittent upward eye bob V: VII: corneal absent.  Motor: No response to nox stim Sensory: As above Cerebellar: Does not perform.       I have reviewed labs in epic and the results pertinent to this consultation are: Cr 1.1 Na 136 Ionized calcium 1.14   I have reviewed the images obtained: CT head - no definite finding  Impression: 41 year old male with cardiac arrest and post cardiac arrest jerking.  On my exam, I do not see any definite myoclonus, the upper eye bob at a frequency of 3 Hz is concerning for ongoing seizure.  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   Recommendations: 1) Ativan 4 mg x 1 2) Keppra 3 g x 1 for total 3500 (approximately 60/kg) 3) increase propofol to 60 mcg/kg/min 4) continuous EEG 5) neurology will follow  This patient is critically ill and at significant risk of neurological worsening, death and care requires constant monitoring of vital signs, hemodynamics,respiratory and cardiac monitoring, neurological  assessment, discussion with family, other specialists and medical decision making of high complexity. I spent 45 minutes of neurocritical care time  in the care of  this patient. This was time spent independent of any time provided by nurse practitioner or PA.  Ritta Slot, MD [facility] [phone]  If 7pm- 7am, please page neurology on call as listed in AMION. Jun 28, 2022  7:27 AM

## 2022-06-06 NOTE — Progress Notes (Signed)
  Echocardiogram 2D Echocardiogram has been performed.  Gerda Diss 2022/06/08, 5:50 PM

## 2022-06-06 NOTE — Progress Notes (Signed)
RT transported patient from ED to 3M without any complications.  

## 2022-06-06 NOTE — Progress Notes (Signed)
PCCM INTERVAL PROGRESS NOTE  Patient entered under the incorrect MRN. Patient in the room and in the chart are different. In attempt to get the charts merged and to have the proper medical record for the patient in the room I have been instructed to place a discharge order for [name] (EMR patient) in order to have the proper chart [name]) associated with the physical patient George Summers).      Joneen Roach, AGACNP-BC [facility]  See Amion for personal pager PCCM on call pager [phone] until 7pm. Please call Elink 7p-7a. [phone]  2022-06-27 9:49 AM

## 2022-06-06 NOTE — Progress Notes (Addendum)
NAME:  George Summers, MRN:  [id number], DOB:  May 02, 1981, LOS: 0 ADMISSION DATE:  05/23/2022, CONSULTATION DATE:  12/18 REFERRING MD:  EDP, CHIEF COMPLAINT:  Cardiac arrest   History of Present Illness:  Admitted 12/18 with cardiac arrest. Became unresponsive after snorting an unknown substance at the time of arrest. Upon fire arrival he was in PEA. Did have VF during the total downtime of 20 mins. 15 mins ACLS done. Myoclonus vs SZ observed. Admitted to ICU on EEG. CT head neg.   Pertinent  Medical History   has a past medical history of GI (gastrointestinal bleed) and Neuropathy.   Significant Hospital Events: Including procedures, antibiotic start and stop dates in addition to other pertinent events   12/18 admit 12/19 c/o sz. Propfol increased by neurology.   Interim History / Subjective:  Temp rose, arctic sun pads placed.  EEG ongoing   Objective   Blood pressure 97/75, pulse 97, temperature (!) 95.5 F (35.3 C), resp. rate 19, weight 60 kg, SpO2 100 %.       No intake or output data in the 24 hours ending 05/27/2022 1130 Filed Weights   06/17/2022 1000  Weight: 60 kg    Examination: General: Middle aged male in NAD on vent HENT: [place]/AT PERRL, no JVD Lungs: Clear bilateral breath sounds Cardiovascular: RRR, no MRG Abdomen: Soft, NT, ND Extremities: No acute deformity. Multiple tattoos.  Neuro: Observed on propofol and fentanyl. RASS -4.  GU: Foley  Resolved Hospital Problem list     Assessment & Plan:   Note: Patient was admitted under inocrrect name and MRN for first 24 hours so charting from that time period may be incomplete.   Cardiac arrest: PEA, VF. 20 mins downtime 15 mins with ACLS. Onset immediately after snorting a substance. UDS positive for cocaine. - TTM protocol, (pads placed 12/19 0630) - Echocardiogram - MAP goal > 65 mmHg - Tight glucose control  Acute respiratory failure with hypoxia - Full vent support - ABG/CXR PRN - VAP bundle -  SBT/SAT when appropriate  Seizure vs Myoclonus - Neurology following - EEG - Goal burst suppression with propofol - Keppra  Lactic acidosis - trend  Elevated troponin - trend  Substance abuse - cessation when appropriate - continue  Best Practice (right click and "Reselect all SmartList Selections" daily)   Diet/type: tubefeeds DVT prophylaxis: LMWH GI prophylaxis: H2B Lines: N/A Foley:  Yes, and it is still needed Code Status:  full code Last date of multidisciplinary goals of care discussion [Attempted to call grandmother 12/19, left message]  Labs   CBC: No results for input(s): "WBC", "NEUTROABS", "HGB", "HCT", "MCV", "PLT" in the last 168 hours.  Basic Metabolic Panel: No results for input(s): "NA", "K", "CL", "CO2", "GLUCOSE", "BUN", "CREATININE", "CALCIUM", "MG", "PHOS" in the last 168 hours. GFR: CrCl cannot be calculated (Patient's most recent lab result is older than the maximum 21 days allowed.). No results for input(s): "PROCALCITON", "WBC", "LATICACIDVEN" in the last 168 hours.  Liver Function Tests: No results for input(s): "AST", "ALT", "ALKPHOS", "BILITOT", "PROT", "ALBUMIN" in the last 168 hours. No results for input(s): "LIPASE", "AMYLASE" in the last 168 hours. No results for input(s): "AMMONIA" in the last 168 hours.  ABG    Component Value Date/Time   TCO2 25 03/05/2021 1550     Coagulation Profile: No results for input(s): "INR", "PROTIME" in the last 168 hours.  Cardiac Enzymes: No results for input(s): "CKTOTAL", "CKMB", "CKMBINDEX", "TROPONINI" in the last 168 hours.  HbA1C: Hgb A1c MFr Bld  Date/Time Value Ref Range Status  03/15/2021 05:00 AM 5.3 4.8 - 5.6 % Final    Comment:    (NOTE) Pre diabetes:          5.7%-6.4%  Diabetes:              >6.4%  Glycemic control for   <7.0% adults with diabetes     CBG: No results for input(s): "GLUCAP" in the last 168 hours.  Review of Systems:     Past Medical History:  He,   has a past medical history of GI (gastrointestinal bleed) and Neuropathy.   Surgical History:   Past Surgical History:  Procedure Laterality Date   BUBBLE STUDY  08/02/2020   Procedure: BUBBLE STUDY;  Surgeon: Quintella Reichert, MD;  Location: [facility] ENDOSCOPY;  Service: Cardiovascular;;   IR US GUIDE BX ASP/DRAIN  07/30/2020   TEE WITHOUT CARDIOVERSION N/A 08/02/2020   Procedure: TRANSESOPHAGEAL ECHOCARDIOGRAM (TEE);  Surgeon: Quintella Reichert, MD;  Location: [facility] ENDOSCOPY;  Service: Cardiovascular;  Laterality: N/A;   TONSILLECTOMY       Social History:   reports that he has been smoking cigarettes. He has never used smokeless tobacco. He reports current alcohol use. He reports current drug use. Drugs: Marijuana and Heroin.   Family History:  His family history is not on file.   Allergies No Known Allergies   Home Medications  Prior to Admission medications   Not on File     Critical care time: 57 minutes     Joneen Roach, AGACNP-BC [facility]  See Amion for personal pager PCCM on call pager [phone] until 7pm. Please call Elink 7p-7a. [phone]  06/11/2022 12:03 PM   Patient seen, independently examined, care plan was formulated and discussed with Jonetta Osgood as per documentation  S/p cardiac arrest, downtime of 20 minutes, 50 minutes ACLS Concerns for seizures Has EEG in place at present Burst suppression  Stable hemodynamics Unresponsive Endotracheal tube in place, clear breath sounds S1-S2 appreciated Bowel sounds appreciated No clubbing, no edema  Labs reviewed  Cardiac arrest Acute respiratory failure Seizure disorder Lactic acidosis Substance abuse  Continue hemodynamic support Continue ventilator support -ABG noted  Not ready for extubation  The patient is critically ill with multiple organ systems failure and requires high complexity decision making for assessment and support, frequent evaluation and titration of  therapies, application of advanced monitoring technologies and extensive interpretation of multiple databases. Critical Care Time devoted to patient care services described in this note independent of APP/resident time (if applicable)  is 31 minutes.   Virl Diamond MD [place] [facility] Personal pager: See Amion If unanswered, please page CCM On-call: #[phone]

## 2022-06-06 NOTE — Progress Notes (Signed)
eLink Physician-Brief Progress Note Patient Name: George Summers DOB: Jun 13, 1981 MRN: [id number]   Date of Service  06/08/2022  HPI/Events of Note  Lactic Acid = 2.8 --> 7.3. Etiology? LVEF = 35-40% with global hypokinesis. Creatinine = 2.74. It is extremely unlikely that radiology will agree to give IV contrast. Last Hgb = 18.8.   eICU Interventions  Plan: CT Abdomen/Pelvis w/o contrast.  Will request that PCCM ground team place central venous line to permit CVP and COOX determinations.       Intervention Category Major Interventions: Acid-Base disturbance - evaluation and management  George Summers 05/28/2022, 11:46 PM

## 2022-06-06 NOTE — Progress Notes (Addendum)
eLink Physician-Brief Progress Note Patient Name: George Summers DOB: 1980/09/25 MRN: [id number]   Date of Service  05/29/2022  HPI/Events of Note  Temp = 38.9 C. Ice packs not effective. Head CT Scan reveals: No evidence of acute intracranial pathology.  No convincing evidence of anoxic brain injury.  eICU Interventions  Will start Normothermic TTM via post cardiac arrest TTM protocol. Will ask Neurology to see the patient in consultation.      Intervention Category Major Interventions: Other:  [name] 06/14/2022, 6:02 AM

## 2022-06-06 NOTE — Progress Notes (Signed)
Pt arrives from the ER with the following personal belongings. No family at bedside  - 2 brown shoes - 2 socks - 1 pair of jeans - 1 blue jacket - 1 shirt - 1 black wallet  Items placed in pt belonging bag

## 2022-06-06 NOTE — Progress Notes (Signed)
Initial Nutrition Assessment  DOCUMENTATION CODES:   Non-severe (moderate) malnutrition in context of acute illness/injury  INTERVENTION:   Initiate tube feeds via OG tube: - Start Vital 1.5 @ 20 ml/hr and advance rate by 10 ml q 8 hours to goal rate of 55 ml/hr (1320 ml/day) - PROSource TF20 60 ml daily  Tube feeding regimen at goal rate provides 2060 kcal, 109 grams of protein, and 1008 ml of H2O.  Monitor magnesium, potassium, and phosphorus BID for at least 3 days, MD to replete as needed, as pt is at risk for refeeding syndrome given malnutrition, polysubstance abuse.  - MVI with minerals daily per tube  - Thiamine 100 mg daily per tube x 5 days given refeeding risk  NUTRITION DIAGNOSIS:   Moderate Malnutrition related to social / environmental circumstances (polysubstance abuse) as evidenced by moderate muscle depletion, moderate fat depletion.  GOAL:   Patient will meet greater than or equal to 90% of their needs  MONITOR:   Vent status, Labs, Weight trends, TF tolerance  REASON FOR ASSESSMENT:   Ventilator, Consult Enteral/tube feeding initiation and management  ASSESSMENT:   41 year old male who presented to the ED on 12/18 after "snorting" a line of unknown substance and sudden collapse. Pt experienced PEA arrest. PMH of polysubstance abuse.  Discussed pt with RN and during ICU rounds. Consult received for enteral nutrition initiation and management. Pt with OG tube in stomach per x-ray.  Pt meets criteria for moderate malnutrition. Pt at refeeding risk. Will start tube feeds at trickle rate and slowly advance to goal. Will also order MVI with minerals daily and thiamine 100 mg x 5 days given refeeding risk.  Unable to obtain diet and weight history at this time. Reviewed weight history in chart. Pt with 6-7 kg weight loss since October 2022. This is not significant for timeframe but is concerning given pt meets criteria for malnutrition.  Patient is currently  intubated on ventilator support MV: 14.7 L/min Temp (24hrs), Avg:98.7 F (37.1 C), Min:94.6 F (34.8 C), Max:102.2 F (39 C)  Drips: Propofol: 50 mcg/kg/min (provides 475 kcal daily from lipid) Fentanyl  Medications reviewed and include: colace, pepcid, miralax  Labs reviewed: potassium 5.3, ionized calcium 1.03, lactic acid 3.7, WBC 27.0 CBG's: 111-134  NUTRITION - FOCUSED PHYSICAL EXAM:  Flowsheet Row Most Recent Value  Orbital Region Severe depletion  Upper Arm Region Moderate depletion  Thoracic and Lumbar Region Unable to assess  [Arctic Sun pads]  Buccal Region Unable to assess  [ETT holder]  Temple Region Moderate depletion  Clavicle Bone Region Moderate depletion  Clavicle and Acromion Bone Region Moderate depletion  Scapular Bone Region Moderate depletion  Dorsal Hand Mild depletion  Patellar Region Moderate depletion  Anterior Thigh Region Unable to assess  [Arctic Sun pads]  Posterior Calf Region No depletion  Edema (RD Assessment) None  Hair Reviewed  Eyes Unable to assess  Mouth Unable to assess  Skin Reviewed  Nails Reviewed       Diet Order:   Diet Order     None       EDUCATION NEEDS:   Not appropriate for education at this time  Skin:  Skin Assessment: Reviewed RN Assessment  Last BM:  no documented BM  Height:   Ht Readings from Last 1 Encounters:  Jun 16, 2022 5\' 9"  (1.753 m)    Weight:   Wt Readings from Last 1 Encounters:  2022-06-16 60 kg    BMI:  Body mass index is 19.53 kg/m.  Estimated Nutritional Needs:   Kcal:  2000-2200  Protein:  90-110 grams  Fluid:  >2.0 L    Mertie Clause, MS, RD, LDN Inpatient Clinical Dietitian Please see AMiON for contact information.

## 2022-06-07 ENCOUNTER — Inpatient Hospital Stay (HOSPITAL_COMMUNITY): Payer: Commercial Managed Care - HMO

## 2022-06-07 DIAGNOSIS — I5020 Unspecified systolic (congestive) heart failure: Secondary | ICD-10-CM | POA: Diagnosis not present

## 2022-06-07 DIAGNOSIS — T50901A Poisoning by unspecified drugs, medicaments and biological substances, accidental (unintentional), initial encounter: Principal | ICD-10-CM | POA: Diagnosis present

## 2022-06-07 DIAGNOSIS — R4182 Altered mental status, unspecified: Secondary | ICD-10-CM

## 2022-06-07 DIAGNOSIS — T50904D Poisoning by unspecified drugs, medicaments and biological substances, undetermined, subsequent encounter: Secondary | ICD-10-CM

## 2022-06-07 DIAGNOSIS — E440 Moderate protein-calorie malnutrition: Secondary | ICD-10-CM | POA: Insufficient documentation

## 2022-06-07 DIAGNOSIS — I469 Cardiac arrest, cause unspecified: Secondary | ICD-10-CM | POA: Diagnosis not present

## 2022-06-07 LAB — CBC
HCT: 41.5 % (ref 39.0–52.0)
Hemoglobin: 14.4 g/dL (ref 13.0–17.0)
MCH: 33.9 pg (ref 26.0–34.0)
MCHC: 34.7 g/dL (ref 30.0–36.0)
MCV: 97.6 fL (ref 80.0–100.0)
Platelets: 127 10*3/uL — ABNORMAL LOW (ref 150–400)
RBC: 4.25 MIL/uL (ref 4.22–5.81)
RDW: 14.4 % (ref 11.5–15.5)
WBC: 19.1 10*3/uL — ABNORMAL HIGH (ref 4.0–10.5)
nRBC: 0 % (ref 0.0–0.2)

## 2022-06-07 LAB — POCT I-STAT 7, (LYTES, BLD GAS, ICA,H+H)
Acid-base deficit: 2 mmol/L (ref 0.0–2.0)
Bicarbonate: 22.4 mmol/L (ref 20.0–28.0)
Calcium, Ion: 1.01 mmol/L — ABNORMAL LOW (ref 1.15–1.40)
HCT: 35 % — ABNORMAL LOW (ref 39.0–52.0)
Hemoglobin: 11.9 g/dL — ABNORMAL LOW (ref 13.0–17.0)
O2 Saturation: 97 %
Patient temperature: 96.2
Potassium: 4.1 mmol/L (ref 3.5–5.1)
Sodium: 138 mmol/L (ref 135–145)
TCO2: 24 mmol/L (ref 22–32)
pCO2 arterial: 35.5 mmHg (ref 32–48)
pH, Arterial: 7.403 (ref 7.35–7.45)
pO2, Arterial: 82 mmHg — ABNORMAL LOW (ref 83–108)

## 2022-06-07 LAB — BASIC METABOLIC PANEL
Anion gap: 14 (ref 5–15)
Anion gap: 8 (ref 5–15)
BUN: 44 mg/dL — ABNORMAL HIGH (ref 6–20)
BUN: 44 mg/dL — ABNORMAL HIGH (ref 6–20)
CO2: 22 mmol/L (ref 22–32)
CO2: 22 mmol/L (ref 22–32)
Calcium: 7.3 mg/dL — ABNORMAL LOW (ref 8.9–10.3)
Calcium: 7.1 mg/dL — ABNORMAL LOW (ref 8.9–10.3)
Chloride: 103 mmol/L (ref 98–111)
Chloride: 110 mmol/L (ref 98–111)
Creatinine, Ser: 2.97 mg/dL — ABNORMAL HIGH (ref 0.61–1.24)
Creatinine, Ser: 2.91 mg/dL — ABNORMAL HIGH (ref 0.61–1.24)
GFR, Estimated: 27 mL/min — ABNORMAL LOW (ref >60)
GFR, Estimated: 27 mL/min — ABNORMAL LOW (ref >60)
Glucose, Bld: 152 mg/dL — ABNORMAL HIGH (ref 70–99)
Glucose, Bld: 111 mg/dL — ABNORMAL HIGH (ref 70–99)
Potassium: 3.7 mmol/L (ref 3.5–5.1)
Potassium: 4 mmol/L (ref 3.5–5.1)
Sodium: 139 mmol/L (ref 135–145)
Sodium: 140 mmol/L (ref 135–145)

## 2022-06-07 LAB — GLUCOSE, CAPILLARY
Glucose-Capillary: 112 mg/dL — ABNORMAL HIGH (ref 70–99)
Glucose-Capillary: 114 mg/dL — ABNORMAL HIGH (ref 70–99)
Glucose-Capillary: 119 mg/dL — ABNORMAL HIGH (ref 70–99)
Glucose-Capillary: 121 mg/dL — ABNORMAL HIGH (ref 70–99)
Glucose-Capillary: 140 mg/dL — ABNORMAL HIGH (ref 70–99)
Glucose-Capillary: 157 mg/dL — ABNORMAL HIGH (ref 70–99)

## 2022-06-07 LAB — TRIGLYCERIDES: Triglycerides: 104 mg/dL (ref <150)

## 2022-06-07 LAB — LIPASE, BLOOD: Lipase: 61 U/L — ABNORMAL HIGH (ref 11–51)

## 2022-06-07 LAB — PHOSPHORUS: Phosphorus: 4.6 mg/dL (ref 2.5–4.6)

## 2022-06-07 LAB — PROCALCITONIN: Procalcitonin: 52.75 ng/mL

## 2022-06-07 LAB — LACTIC ACID, PLASMA: Lactic Acid, Venous: 1.8 mmol/L (ref 0.5–1.9)

## 2022-06-07 LAB — MAGNESIUM: Magnesium: 2.3 mg/dL (ref 1.7–2.4)

## 2022-06-07 LAB — POTASSIUM
Potassium: 4.1 mmol/L (ref 3.5–5.1)
Potassium: 3.7 mmol/L (ref 3.5–5.1)

## 2022-06-07 MED ORDER — MAGNESIUM SULFATE 2 GM/50ML IV SOLN
2.0000 g | Freq: Once | INTRAVENOUS | Status: AC
  Administered 2022-06-07: 2 g via INTRAVENOUS
  Filled 2022-06-07: qty 50

## 2022-06-07 MED ORDER — FAMOTIDINE 20 MG PO TABS
20.0000 mg | ORAL_TABLET | Freq: Every day | ORAL | Status: DC
  Administered 2022-06-07 – 2022-06-09 (×3): 20 mg
  Filled 2022-06-07 (×3): qty 1

## 2022-06-07 MED ORDER — LACTATED RINGERS IV BOLUS
1000.0000 mL | Freq: Once | INTRAVENOUS | Status: AC
  Administered 2022-06-07: 1000 mL via INTRAVENOUS

## 2022-06-07 MED ORDER — ENOXAPARIN SODIUM 30 MG/0.3ML IJ SOSY
30.0000 mg | PREFILLED_SYRINGE | INTRAMUSCULAR | Status: DC
  Administered 2022-06-07: 30 mg via SUBCUTANEOUS
  Filled 2022-06-07: qty 0.3

## 2022-06-07 MED ORDER — SODIUM CHLORIDE 0.9% FLUSH
10.0000 mL | Freq: Two times a day (BID) | INTRAVENOUS | Status: DC
  Administered 2022-06-07: 10 mL
  Administered 2022-06-07 – 2022-06-08 (×2): 20 mL
  Administered 2022-06-08 – 2022-06-09 (×2): 10 mL

## 2022-06-07 MED ORDER — CALCIUM GLUCONATE-NACL 2-0.675 GM/100ML-% IV SOLN
2.0000 g | Freq: Once | INTRAVENOUS | Status: AC
  Administered 2022-06-07: 2000 mg via INTRAVENOUS
  Filled 2022-06-07: qty 100

## 2022-06-07 MED ORDER — SODIUM CHLORIDE 0.9% FLUSH: 10.0000 mL | INTRAVENOUS | Status: DC | PRN

## 2022-06-07 MED ORDER — SODIUM CHLORIDE 0.9 % IV BOLUS
1000.0000 mL | Freq: Once | INTRAVENOUS | Status: AC
  Administered 2022-06-07: 1000 mL via INTRAVENOUS

## 2022-06-07 MED ORDER — SODIUM CHLORIDE 0.9 % IV SOLN: INTRAVENOUS | Status: DC

## 2022-06-07 NOTE — Progress Notes (Signed)
Lactate normalized  CVP is still 4  Sedation been titrated down  Will give another bolus of saline

## 2022-06-07 NOTE — Progress Notes (Signed)
Patient transported to and from CT without incident.

## 2022-06-07 NOTE — Progress Notes (Signed)
LTM maint complete - no skin breakdown   serviced C4 O1 P3 [facility] monitored, Event button test confirmed by [facility].

## 2022-06-07 NOTE — Progress Notes (Signed)
eLink Physician-Brief Progress Note Patient Name: MARQUISE WICKE DOB: [birth year] MRN: [id number]   Date of Service  06/07/2022  HPI/Events of Note  CVP = 3.  eICU Interventions  Plan: Bolus with LR 1 liter IV over 1 hour now.  When CVP >= 10, will obtain COOX.     Intervention Category Major Interventions: Other:  Ashan Cueva Dennard Nip 06/07/2022, 2:54 AM

## 2022-06-07 NOTE — Progress Notes (Addendum)
NAME:  George Summers, MRN:  [id number], DOB:  1980-12-25, LOS: 1 ADMISSION DATE:  Jun 15, 2022, CONSULTATION DATE:  12/18 REFERRING MD:  EDP, CHIEF COMPLAINT:  Cardiac arrest   History of Present Illness:  Admitted 12/18 with cardiac arrest. Became unresponsive after snorting an unknown substance at the time of arrest. Upon fire arrival he was in PEA. Did have VF during the total downtime of 20 mins. 15 mins ACLS done. Myoclonus vs SZ observed. Admitted to ICU on EEG. CT head neg.    Pertinent  Medical History   has a past medical history of GI (gastrointestinal bleed) and Neuropathy.   Significant Hospital Events: Including procedures, antibiotic start and stop dates in addition to other pertinent events   12/18 admit 12/19 c/o sz. Propfol increased by neurology.  12/20 increasing lactate, bolused fluids overnight  Interim History / Subjective:  Temperature 98.6 Bolused fluid overnight-had about 2 L bolus for low CVP   Objective   Blood pressure 126/79, pulse (!) 119, temperature 98.2 F (36.8 C), resp. rate (!) 32, weight 60 kg, SpO2 98 %. CVP:  [3 mmHg-4 mmHg] 4 mmHg  Vent Mode: PRVC FiO2 (%):  [40 %] 40 % Set Rate:  [26 bmp] 26 bmp Vt Set:  [430 mL-560 mL] 560 mL PEEP:  [5 cmH20] 5 cmH20   Intake/Output Summary (Last 24 hours) at 06/07/2022 0730 Last data filed at Jun 15, 2022 2200 Gross per 24 hour  Intake 1027.13 ml  Output 265 ml  Net 762.13 ml   Filed Weights   15-Jun-2022 1000  Weight: 60 kg    Examination: General: Middle-age, unresponsive, chronically ill-appearing HENT: Endotracheal tube in place, moist oral mucosa Lungs: Decreased air movement at the bases bilaterally with rales posteriorly Cardiovascular: S1-S2 appreciated Abdomen: Soft, bowel sounds appreciated Extremities: No extremity edema Neuro: Observed on propofol and fentanyl. RASS -4.  GU: Foley  Resolved Hospital Problem list     Assessment & Plan:   Note: Patient was admitted under  inocrrect name and MRN for first 24 hours so charting from that time period may be incomplete.   Cardiac arrest PEA, V-fib  -20 minutes downtime, 15 minutes of ACLS -Post snorting unknown substance, UDS positive for cocaine -TTM protocol -Echocardiogram shows decreased ejection fraction -Goal MAP 65 -Tight glucose control  Encephalopathy Postcardiac arrest Myoclonus -On continuous EEG -Burst suppression -Continue Keppra -Appreciate neurology follow-up -Consider MRI in 3 to 5 days  Heart failure with decreased ejection fraction Following cardiac arrest -Continue to monitor -CVP 5 -This is likely related to substance use -Ejection fraction down from 60-65% to 35 to 40%  Acute hypoxemic respiratory failure -Continue full ventilator support -CT abdomen showing bibasilar atelectasis -Empiric Zosyn-will discontinue vancomycin -Continue mechanical ventilation  -Target TVol 6-8cc/kgIBW -Target Plateau Pressure < 30cm H20 -Target driving pressure less than 15 cm of water -Ventilator associated pneumonia prevention protocol  Pneumonia with bibasal atelectasis -May be related to aspiration -MRSA PCR negative -Discontinue vancomycin  Agitation management -On propofol -On fentanyl  Acute kidney injury -Nonoliguric -Continue hemodynamic support -Maintain renal perfusion -Avoid nephrotoxic agents  Transaminitis -Likely related to shock liver -Continue to monitor  Seizure versus myoclonus -Appreciate neurology follow-up -EEG ongoing -Burst suppression with propofol -On Keppra  Lactic acidosis -Continue to trend -No evidence of bowel infarction  Elevated troponin Substance abuse-cocaine -Continue supportive measures  Substance abuse - cessation when appropriate - continue  -Nutrition -Continue tube feeds  Best Practice (right click and "Reselect all SmartList Selections" daily)   Diet/type:  tubefeeds DVT prophylaxis: LMWH GI prophylaxis: H2B Lines:  N/A Foley:  Yes, and it is still needed Code Status:  full code Last date of multidisciplinary goals of care discussion [Attempted to call grandmother 12/19, left message]  Labs   CBC: Recent Labs  Lab 06/05/22 2025 06/05/22 2109 06/05/22 2320 02-Jul-2022 0002 Jul 02, 2022 0630 07-02-22 1221 07-02-22 1238 06/07/22 0224  WBC 8.6  --  13.4*  --   --   --  27.0* 19.1*  HGB 15.6   < > 19.4* 19.4* 19.7* 18.0* 18.8* 14.4  HCT 47.6   < > 57.1* 57.0* 58.0* 53.0* 54.6* 41.5  MCV 103.3*  --  99.3  --   --   --  98.7 97.6  PLT 188  --  203  --   --   --  166 127*   < > = values in this interval not displayed.    Basic Metabolic Panel: Recent Labs  Lab 06/05/22 2025 06/05/22 2109 06/05/22 2320 07/02/22 0002 07/02/22 0630 2022-07-02 1221 Jul 02, 2022 1238 07/02/2022 1726 2022-07-02 2023 06/07/22 0224 06/07/22 0527  NA 138   < >  --    < > 137 137  --  140 138 139  --   K 3.8   < >  --    < > 6.1* 5.3*  --  6.1* 4.4 3.7 4.1  CL 100  --   --   --   --   --   --  103 105 103  --   CO2 19*  --   --   --   --   --   --  20* 15* 22  --   GLUCOSE 327*  --   --   --   --   --   --  121* 140* 152*  --   BUN 9  --   --   --   --   --   --  29* 36* 44*  --   CREATININE 1.25*  --  1.10  --   --   --   --  2.46* 2.74* 2.97*  --   CALCIUM 8.4*  --   --   --   --   --   --  7.6* 7.0* 7.3*  --   MG 2.6*  --   --   --   --   --   --  1.5*  --  2.3  --   PHOS 11.4*  --   --   --   --   --  4.9*  --   --  4.6  --    < > = values in this interval not displayed.   GFR: Estimated Creatinine Clearance: 28.3 mL/min (A) (by C-G formula based on SCr of 2.97 mg/dL (H)). Recent Labs  Lab 06/05/22 2025 06/05/22 2320 07/02/2022 0225 July 02, 2022 1238 2022/07/02 1508 07/02/2022 1845 07-02-22 2142 06/07/22 0224  PROCALCITON 0.10  --   --   --  53.33  --   --  52.75  WBC 8.6 13.4*  --  27.0*  --   --   --  19.1*  LATICACIDVEN 8.6* 4.2*   < > 3.7* 4.5* 2.8* 7.3*  --    < > = values in this interval not displayed.     Liver Function Tests: Recent Labs  Lab 06/05/22 2025 07/02/22 1726  AST 414* 428*  ALT 135* 164*  ALKPHOS 164* 121  BILITOT 0.4 0.6  PROT 6.3*  6.3*  ALBUMIN 3.5 3.2*   Recent Labs  Lab 06/07/22 0224  LIPASE 61*   No results for input(s): "AMMONIA" in the last 168 hours.  ABG    Component Value Date/Time   PHART 7.329 (L) June 10, 2022 1221   PCO2ART 37.8 06/10/2022 1221   PO2ART 105 06-10-22 1221   HCO3 20.3 2022-06-10 1221   TCO2 22 June 10, 2022 1221   ACIDBASEDEF 6.0 (H) 10-Jun-2022 1221   O2SAT 98 06-10-2022 1221     Coagulation Profile: Recent Labs  Lab 06/05/22 2025  INR 1.0    Cardiac Enzymes: Recent Labs  Lab 2022/06/10 2023  CKTOTAL 259    HbA1C: Hgb A1c MFr Bld  Date/Time Value Ref Range Status  03/15/2021 05:00 AM 5.3 4.8 - 5.6 % Final    Comment:    (NOTE) Pre diabetes:          5.7%-6.4%  Diabetes:              >6.4%  Glycemic control for   <7.0% adults with diabetes     CBG: Recent Labs  Lab June 10, 2022 1158 06/10/22 1546 June 10, 2022 2019 2022-06-10 2341 06/07/22 0342  GLUCAP 134* 135* 142* 165* 157*    Review of Systems:     Past Medical History:  He,  has a past medical history of GI (gastrointestinal bleed) and Neuropathy.   Surgical History:   Past Surgical History:  Procedure Laterality Date   BUBBLE STUDY  08/02/2020   Procedure: BUBBLE STUDY;  Surgeon: Quintella Reichert, MD;  Location: [facility] ENDOSCOPY;  Service: Cardiovascular;;   IR US GUIDE BX ASP/DRAIN  07/30/2020   TEE WITHOUT CARDIOVERSION N/A 08/02/2020   Procedure: TRANSESOPHAGEAL ECHOCARDIOGRAM (TEE);  Surgeon: Quintella Reichert, MD;  Location: [facility] ENDOSCOPY;  Service: Cardiovascular;  Laterality: N/A;   TONSILLECTOMY       Social History:   reports that he has been smoking cigarettes. He has never used smokeless tobacco. He reports current alcohol use. He reports current drug use. Drugs: Marijuana and Heroin.   Family History:  His family history is not on file.    Allergies No Known Allergies   Home Medications  Prior to Admission medications   Not on File    The patient is critically ill with multiple organ systems failure and requires high complexity decision making for assessment and support, frequent evaluation and titration of therapies, application of advanced monitoring technologies and extensive interpretation of multiple databases. Critical Care Time devoted to patient care services described in this note independent of APP/resident time (if applicable)  is 40 minutes.   Virl Diamond MD [place] [facility] Personal pager: See Amion If unanswered, please page CCM On-call: #[phone]

## 2022-06-07 NOTE — Progress Notes (Signed)
eLink Physician-Brief Progress Note Patient Name: George Summers DOB: 1980-07-06 MRN: [id number]   Date of Service  06/07/2022  HPI/Events of Note  CVP = 4 post 1 liter LR bolus.   eICU Interventions  Plan: Bolus with LR 1 liter IV over 1 hour now.      Intervention Category Major Interventions: Hypovolemia - evaluation and treatment with fluids;Hypotension - evaluation and management  [name] Dennard Nip 06/07/2022, 4:19 AM

## 2022-06-07 NOTE — Progress Notes (Addendum)
eLink Physician-Brief Progress Note Patient Name: George Summers DOB: 09/01/1980 MRN: [id number]   Date of Service  06/07/2022  HPI/Events of Note  CVP = 5 post 2nd liter of LR.   eICU Interventions  Plan: LR 1 liter IV over 1 hour now.  Repeat Lactic Acid level at 8:30 AM.     Intervention Category Major Interventions: Hypovolemia - evaluation and treatment with fluids  [name] 06/07/2022, 6:21 AM

## 2022-06-07 NOTE — Procedures (Addendum)
Patient Name: George Summers  MRN: [id number]  Epilepsy Attending: Charlsie Quest  Referring Physician/Provider: [name], MD  Duration: 06/10/2022 0752 to 06/07/2022 1013  Patient history: 41 year old male status post cardiac arrest.  EEG to evaluate for seizure.  Level of alertness:  comatose  AEDs during EEG study: Keppra, propofol  Technical aspects: This EEG study was done with scalp electrodes positioned according to the 10-20 International system of electrode placement. Electrical activity was reviewed with band pass filter of 1-70Hz , sensitivity of 7 uV/mm, display speed of 59mm/sec with a 60Hz  notched filter applied as appropriate. EEG data were recorded continuously and digitally stored.  Video monitoring was available and reviewed as appropriate.  Description: EEG showed continuous generalized background suppression which was not reactive to tactile stimulation. Hyperventilation and photic stimulation were not performed.     Respiratory artifact was noted intermittently throughout the study.  ABNORMALITY - Background suppression, generalized  IMPRESSION: This study is suggestive of profound diffuse encephalopathy, nonspecific to etiology.  No seizures or epileptiform discharges were seen throughout the recording.  Gianluca Chhim 

## 2022-06-07 NOTE — Progress Notes (Signed)
eLink Physician-Brief Progress Note Patient Name: George Summers DOB: 1981/01/21 MRN: [id number]   Date of Service  06/07/2022  HPI/Events of Note  Review of CXR s/p placement of central venous catheter reveals: Left internal jugular central venous catheter with tip overlying the superior cavoatrial junction.  eICU Interventions  Plan: OK to use L IJ central venous catheter. Monitor CVP now and Q 4 hours.     Intervention Category Major Interventions: Other:  Phyllistine Domingos Dennard Nip 06/07/2022, 1:48 AM

## 2022-06-07 NOTE — Progress Notes (Signed)
eLink Physician-Brief Progress Note Patient Name: George Summers DOB: 21-Nov-1980 MRN: [id number]   Date of Service  06/07/2022  HPI/Events of Note  Review of Abdominal CT Scan reveals: 1. Findings suggestive of hemorrhagic pancreatitis. Correlate with lipase levels. 2. Bilateral lower lobe airspace opacity suggestive of infection/inflammation.  eICU Interventions  Plan: Lipase level STAT.  Ionized Ca++ level in AM daily.     Intervention Category Major Interventions: Other:  Telia Amundson Dennard Nip 06/07/2022, 1:54 AM

## 2022-06-07 NOTE — Procedures (Signed)
Central Venous Catheter Insertion Procedure Note  George Summers  [id number]  21-Aug-1980  Date:06/07/22  Time:1:29 AM   Provider Performing:Delaina Fetsch R Renie Stelmach   Procedure: Insertion of Non-tunneled Central Venous Catheter(36556) with US guidance (67703)   Indication(s) Medication administration  Consent Unable to obtain consent due to emergent nature of procedure.  Anesthesia Topical only with 1% lidocaine   Timeout Verified patient identification, verified procedure, site/side was marked, verified correct patient position, special equipment/implants available, medications/allergies/relevant history reviewed, required imaging and test results available.  Sterile Technique Maximal sterile technique including full sterile barrier drape, hand hygiene, sterile gown, sterile gloves, mask, hair covering, sterile ultrasound probe cover (if used).  Procedure Description Area of catheter insertion was cleaned with chlorhexidine and draped in sterile fashion.  With real-time ultrasound guidance a central venous catheter was placed into the left internal jugular vein. Nonpulsatile blood flow and easy flushing noted in all ports.  The catheter was sutured in place and sterile dressing applied.  Complications/Tolerance None; patient tolerated the procedure well. Chest X-ray is ordered to verify placement for internal jugular or subclavian cannulation.   Chest x-ray is not ordered for femoral cannulation.  EBL Minimal  Specimen(s) None   Darcella Gasman Luca Burston, PA-C

## 2022-06-07 NOTE — Progress Notes (Signed)
Neurology Progress Note Brief HPI: George Summers is a 41 y.o. male who was with friends when he snorted a line of an white substance (UDS+ cocaine) and then had a witnessed cardiac arrest.  Fire department arrived 4 to 5 minutes after the call and started CPR he was then PEA then went into V-fib and was shocked with ROSC is noted at [place], CPR time was 15 minutes, downtime at least 20 minutes by report. After arrival to [facility] ED, he was noted to have jerking movements. He was not started on TTM, but is on cooling machine to obtain normothermia.   Interval History: LTM EEG since 12/19 has showed myoclonic seizures, currently in suppression d/t propofol. Will lessen in effort to burst suppress. On exam, he is GCS3t w/no response. No jerking/twitching seen on assessment today. Pending EEG read for today.  Currently on 50 mcg/kg/min Propofol,118mcg/hr Fentanyl  Exam: Current vital signs: BP 139/88   Pulse (!) 116   Temp (!) 97.3 F (36.3 C) (Bladder)   Resp (!) 26   Wt 60 kg   SpO2 97%   BMI 19.53 kg/m  Vital signs in last 24 hours: Temp:  [94.8 F (34.9 C)-99 F (37.2 C)] 97.3 F (36.3 C) (12/20 0757) Pulse Rate:  [91-134] 116 (12/20 0804) Resp:  [13-33] 26 (12/20 0804) BP: (92-163)/(67-101) 139/88 (12/20 0804) SpO2:  [92 %-100 %] 97 % (12/20 0804) FiO2 (%):  [40 %] 40 % (12/20 0804) Weight:  [60 kg] 60 kg (12/19 1000)   Physical Exam  In bed, intubated, unresponsive.  Neuro: Mental Status: GCS3t: Does not open eyes or follow commands  Cranial Nerves: Pupils: 23mm, equal round and sluggish reaction to light. Cough: absent Gag: absent Corneal: present. No response to noxious stimuli in any of the extremities.  I have reviewed the labs and images obtained: CT head - no definite finding  Impression: 41 year old male with UDS+ cocaine who arrived to [facility] ED for witnessed cardiac arrest after snorting cocaine. Neurology consulted for post cardiac arrest jerking. 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 He is not undergoing TTM, but is on cooling machine for normothermia. Per AHA guidelines post arrest prognostication, will defer MRI brain for now.   Recommendations/Plan: Supportive care Wean Propofol/Versed as able Keep LTM EEG while weaning sedation If LTM EEG negative for 6hrs off sedation, can D/C  Appreciate CCM assistance with weaning Consider MRI brain in 3-5 days if now improvement or pending GOC d/w family Neurology will follow Continue Keppra 500mg  BID.  Pt seen by Neuro NP/APP and later by MD. Note/plan to be edited by MD as needed.    Otelia Santee, DNP, AGACNP-BC [facility] Please use AMION for pager and EPIC for messaging  NEUROHOSPITALIST ADDENDUM Performed a face to face diagnostic evaluation.   I have reviewed the contents of history and physical exam as documented by PA/ARNP/Resident and agree with above documentation.  I have discussed and formulated the above plan as documented. Edits to the note have been made as needed.  Impression/ey exam findings/Plan: Still significantly suppressed on cEEG. What we thought were burst seems more like respiratory artifact. Plan is to wean propfol by 10 every hour until off and keep him on cEEG for a few hours after sedation is completely off and see how he does. MRI Brain between day 3-5.  Continue Keppra 500mg  BID.  Donnetta Simpers, MD [facility] [phone]   If 7pm to 7am,  please call on call as listed on AMION.

## 2022-06-08 ENCOUNTER — Inpatient Hospital Stay (HOSPITAL_COMMUNITY): Payer: Commercial Managed Care - HMO

## 2022-06-08 DIAGNOSIS — R570 Cardiogenic shock: Secondary | ICD-10-CM | POA: Insufficient documentation

## 2022-06-08 DIAGNOSIS — G253 Myoclonus: Secondary | ICD-10-CM | POA: Insufficient documentation

## 2022-06-08 DIAGNOSIS — I469 Cardiac arrest, cause unspecified: Secondary | ICD-10-CM | POA: Diagnosis not present

## 2022-06-08 DIAGNOSIS — G9341 Metabolic encephalopathy: Secondary | ICD-10-CM | POA: Insufficient documentation

## 2022-06-08 DIAGNOSIS — G931 Anoxic brain damage, not elsewhere classified: Secondary | ICD-10-CM | POA: Diagnosis not present

## 2022-06-08 DIAGNOSIS — J690 Pneumonitis due to inhalation of food and vomit: Secondary | ICD-10-CM | POA: Insufficient documentation

## 2022-06-08 DIAGNOSIS — N179 Acute kidney failure, unspecified: Secondary | ICD-10-CM | POA: Insufficient documentation

## 2022-06-08 LAB — BASIC METABOLIC PANEL
Anion gap: 5 (ref 5–15)
BUN: 45 mg/dL — ABNORMAL HIGH (ref 6–20)
CO2: 23 mmol/L (ref 22–32)
Calcium: 7.6 mg/dL — ABNORMAL LOW (ref 8.9–10.3)
Chloride: 112 mmol/L — ABNORMAL HIGH (ref 98–111)
Creatinine, Ser: 2.64 mg/dL — ABNORMAL HIGH (ref 0.61–1.24)
GFR, Estimated: 31 mL/min — ABNORMAL LOW (ref >60)
Glucose, Bld: 107 mg/dL — ABNORMAL HIGH (ref 70–99)
Potassium: 4.1 mmol/L (ref 3.5–5.1)
Sodium: 140 mmol/L (ref 135–145)

## 2022-06-08 LAB — CBC
HCT: 33 % — ABNORMAL LOW (ref 39.0–52.0)
Hemoglobin: 11.4 g/dL — ABNORMAL LOW (ref 13.0–17.0)
MCH: 34.4 pg — ABNORMAL HIGH (ref 26.0–34.0)
MCHC: 34.5 g/dL (ref 30.0–36.0)
MCV: 99.7 fL (ref 80.0–100.0)
Platelets: 112 10*3/uL — ABNORMAL LOW (ref 150–400)
RBC: 3.31 MIL/uL — ABNORMAL LOW (ref 4.22–5.81)
RDW: 15.2 % (ref 11.5–15.5)
WBC: 13.5 10*3/uL — ABNORMAL HIGH (ref 4.0–10.5)
nRBC: 0 % (ref 0.0–0.2)

## 2022-06-08 LAB — GLUCOSE, CAPILLARY
Glucose-Capillary: 101 mg/dL — ABNORMAL HIGH (ref 70–99)
Glucose-Capillary: 114 mg/dL — ABNORMAL HIGH (ref 70–99)
Glucose-Capillary: 127 mg/dL — ABNORMAL HIGH (ref 70–99)
Glucose-Capillary: 117 mg/dL — ABNORMAL HIGH (ref 70–99)
Glucose-Capillary: 108 mg/dL — ABNORMAL HIGH (ref 70–99)

## 2022-06-08 LAB — COMPREHENSIVE METABOLIC PANEL
ALT: 186 U/L — ABNORMAL HIGH (ref 0–44)
AST: 558 U/L — ABNORMAL HIGH (ref 15–41)
Albumin: 2.2 g/dL — ABNORMAL LOW (ref 3.5–5.0)
Alkaline Phosphatase: 102 U/L (ref 38–126)
Anion gap: 4 — ABNORMAL LOW (ref 5–15)
BUN: 43 mg/dL — ABNORMAL HIGH (ref 6–20)
CO2: 23 mmol/L (ref 22–32)
Calcium: 7.6 mg/dL — ABNORMAL LOW (ref 8.9–10.3)
Chloride: 113 mmol/L — ABNORMAL HIGH (ref 98–111)
Creatinine, Ser: 2.57 mg/dL — ABNORMAL HIGH (ref 0.61–1.24)
GFR, Estimated: 32 mL/min — ABNORMAL LOW (ref >60)
Glucose, Bld: 133 mg/dL — ABNORMAL HIGH (ref 70–99)
Potassium: 3.7 mmol/L (ref 3.5–5.1)
Sodium: 140 mmol/L (ref 135–145)
Total Bilirubin: 0.6 mg/dL (ref 0.3–1.2)
Total Protein: 5.1 g/dL — ABNORMAL LOW (ref 6.5–8.1)

## 2022-06-08 LAB — PROCALCITONIN: Procalcitonin: 30.35 ng/mL

## 2022-06-08 LAB — CALCIUM, IONIZED: Calcium, Ionized, Serum: 4.1 mg/dL — ABNORMAL LOW (ref 4.5–5.6)

## 2022-06-08 LAB — MAGNESIUM: Magnesium: 3 mg/dL — ABNORMAL HIGH (ref 1.7–2.4)

## 2022-06-08 LAB — PHOSPHORUS: Phosphorus: 4.3 mg/dL (ref 2.5–4.6)

## 2022-06-08 MED ORDER — BUSPIRONE HCL 5 MG PO TABS
5.0000 mg | ORAL_TABLET | Freq: Three times a day (TID) | ORAL | Status: DC | PRN
  Administered 2022-06-08 – 2022-06-09 (×2): 5 mg
  Filled 2022-06-08 (×2): qty 1

## 2022-06-08 MED ORDER — SCOPOLAMINE 1 MG/3DAYS TD PT72
1.0000 | MEDICATED_PATCH | TRANSDERMAL | Status: DC
  Administered 2022-06-08: 1.5 mg via TRANSDERMAL
  Filled 2022-06-08: qty 1

## 2022-06-08 MED ORDER — ACETAMINOPHEN 325 MG PO TABS
650.0000 mg | ORAL_TABLET | Freq: Four times a day (QID) | ORAL | Status: DC | PRN
  Administered 2022-06-08 (×2): 650 mg
  Filled 2022-06-08 (×2): qty 2

## 2022-06-08 MED ORDER — ACETAMINOPHEN 160 MG/5ML PO SOLN
650.0000 mg | Freq: Four times a day (QID) | ORAL | Status: DC | PRN
  Administered 2022-06-09 (×2): 650 mg
  Filled 2022-06-08 (×2): qty 20.3

## 2022-06-08 MED ORDER — ENOXAPARIN SODIUM 40 MG/0.4ML IJ SOSY
40.0000 mg | PREFILLED_SYRINGE | INTRAMUSCULAR | Status: DC
  Administered 2022-06-08 – 2022-06-09 (×2): 40 mg via SUBCUTANEOUS
  Filled 2022-06-08 (×2): qty 0.4

## 2022-06-08 MED ORDER — IPRATROPIUM-ALBUTEROL 0.5-2.5 (3) MG/3ML IN SOLN
3.0000 mL | Freq: Four times a day (QID) | RESPIRATORY_TRACT | Status: DC
  Administered 2022-06-08 – 2022-06-09 (×5): 3 mL via RESPIRATORY_TRACT
  Filled 2022-06-08 (×5): qty 3

## 2022-06-08 NOTE — Progress Notes (Signed)
NAME:  George Summers, MRN:  [id number], DOB:  September 09, 1980, LOS: 2 ADMISSION DATE:  05/19/2022, CONSULTATION DATE:  12/18 REFERRING MD:  EDP, CHIEF COMPLAINT:  Cardiac arrest   History of Present Illness:  Admitted 12/18 with cardiac arrest. Became unresponsive after snorting an unknown substance at the time of arrest. Upon fire arrival he was in PEA. Did have VF during the total downtime of 20 mins. 15 mins ACLS done. Myoclonus vs SZ observed. Admitted to ICU on EEG. CT head neg.   Pertinent  Medical History   has a past medical history of GI (gastrointestinal bleed) and Neuropathy.   Significant Hospital Events: Including procedures, antibiotic start and stop dates in addition to other pertinent events   12/18 admit 12/19 c/o sz. Propfol increased by neurology.  12/20 increasing lactate, bolused fluids overnight 12/21 BuSpar added for shivering  Interim History / Subjective:  Temperature 96.8 EEG is flat per neurology  Objective   Blood pressure (!) 165/87, pulse (!) 129, temperature (!) 101.3 F (38.5 C), resp. rate (!) 33, weight 60 kg, SpO2 96 %. CVP:  [6 mmHg-12 mmHg] 11 mmHg  Vent Mode: PRVC FiO2 (%):  [40 %] 40 % Set Rate:  [26 bmp] 26 bmp Vt Set:  [560 mL] 560 mL PEEP:  [5 cmH20] 5 cmH20 Pressure Support:  [5 cmH20] 5 cmH20 Plateau Pressure:  [17 cmH20-18 cmH20] 17 cmH20   Intake/Output Summary (Last 24 hours) at 06/08/2022 1424 Last data filed at 06/08/2022 1300 Gross per 24 hour  Intake 3918.8 ml  Output 1885 ml  Net 2033.8 ml   Filed Weights   06/18/2022 1000  Weight: 60 kg    Examination: General: Middle-age, unresponsive HENT: Endotracheal tube in place Lungs: Decreased air movement at the bases bilaterally with rales posteriorly Cardiovascular: S1-S2 appreciated Abdomen: Soft, bowel sounds appreciated Extremities: No extremity edema Neuro: Unresponsive no corneals, no gag, no cough GU: Foley  Resolved Hospital Problem list     Assessment &  Plan:   Note: Patient was admitted under inocrrect name and MRN for first 24 hours so charting from that time period may be incomplete.   Cardiac arrest, PEA, V-fib -20-minute downtime with 15 minutes of ACLS Completed TTM Continue tight glucose control Echocardiogram shows decreased ejection fraction  Encephalopathy postcardiac arrest Myoclonus -Flat EEG -MRI today -MRI shows evidence of anoxic brain injury  Heart failure with decreased ejection fraction -Continue to monitor -Related to substance abuse  Acute hypoxemic respiratory failure -Continue mechanical ventilation -Continue full support  Pneumonia with bibasal atelectasis --On Zosyn -Vancomycin discontinued 9  Acute kidney injury -Nonoliguric -Maintain renal perfusion  Transaminitis -Likely related to shock liver  Seizure versus myoclonus -On Keppra  Elevated troponin Substance abuse-cocaine -Continue supportive measures  Substance abuse - cessation when appropriate - continue  -Nutrition -Continue tube feeds  Prognosis is poor with MRI findings Will update family  Best Practice (right click and "Reselect all SmartList Selections" daily)   Diet/type: tubefeeds DVT prophylaxis: LMWH GI prophylaxis: H2B Lines: N/A Foley:  Yes, and it is still needed Code Status:  full code Last date of multidisciplinary goals of care discussion [Attempted to call grandmother 12/19, left message]  Labs   CBC: Recent Labs  Lab 06/05/22 2025 06/05/22 2109 06/05/22 2320 06/16/2022 0002 06/17/2022 1221 05/22/2022 1238 06/07/22 0224 06/07/22 0817 06/08/22 0427  WBC 8.6  --  13.4*  --   --  27.0* 19.1*  --  13.5*  HGB 15.6   < > 19.4*   < >  18.0* 18.8* 14.4 11.9* 11.4*  HCT 47.6   < > 57.1*   < > 53.0* 54.6* 41.5 35.0* 33.0*  MCV 103.3*  --  99.3  --   --  98.7 97.6  --  99.7  PLT 188  --  203  --   --  166 127*  --  112*   < > = values in this interval not displayed.    Basic Metabolic Panel: Recent Labs   Lab 06/05/22 2025 06/05/22 2109 06/18/2022 1238 05/19/2022 1726 05/19/2022 2023 06/07/22 0224 06/07/22 0527 06/07/22 0817 06/07/22 1007 06/07/22 1636 06/08/22 0427 06/08/22 1055  NA 138   < >  --  140 138 139  --  138  --  140 140 140  K 3.8   < >  --  6.1* 4.4 3.7   < > 4.1 3.7 4.0 4.1 3.7  CL 100  --   --  103 105 103  --   --   --  110 112* 113*  CO2 19*  --   --  20* 15* 22  --   --   --  22 23 23   GLUCOSE 327*  --   --  121* 140* 152*  --   --   --  111* 107* 133*  BUN 9  --   --  29* 36* 44*  --   --   --  44* 45* 43*  CREATININE 1.25*   < >  --  2.46* 2.74* 2.97*  --   --   --  2.91* 2.64* 2.57*  CALCIUM 8.4*  --   --  7.6* 7.0* 7.3*  --   --   --  7.1* 7.6* 7.6*  MG 2.6*  --   --  1.5*  --  2.3  --   --   --   --  3.0*  --   PHOS 11.4*  --  4.9*  --   --  4.6  --   --   --   --  4.3  --    < > = values in this interval not displayed.   GFR: Estimated Creatinine Clearance: 32.7 mL/min (A) (by C-G formula based on SCr of 2.57 mg/dL (H)). Recent Labs  Lab 06/05/22 2025 06/05/22 2320 05/28/2022 0225 06/11/2022 1238 05/30/2022 1508 05/20/2022 1845 05/23/2022 2142 06/07/22 0224 06/07/22 0830 06/08/22 0427  PROCALCITON 0.10  --   --   --  53.33  --   --  52.75  --  30.35  WBC 8.6 13.4*  --  27.0*  --   --   --  19.1*  --  13.5*  LATICACIDVEN 8.6* 4.2*   < > 3.7* 4.5* 2.8* 7.3*  --  1.8  --    < > = values in this interval not displayed.    Liver Function Tests: Recent Labs  Lab 06/05/22 2025 06/05/2022 1726 06/08/22 1055  AST 414* 428* 558*  ALT 135* 164* 186*  ALKPHOS 164* 121 102  BILITOT 0.4 0.6 0.6  PROT 6.3* 6.3* 5.1*  ALBUMIN 3.5 3.2* 2.2*   Recent Labs  Lab 06/07/22 0224  LIPASE 61*   No results for input(s): "AMMONIA" in the last 168 hours.  ABG    Component Value Date/Time   PHART 7.403 06/07/2022 0817   PCO2ART 35.5 06/07/2022 0817   PO2ART 82 (L) 06/07/2022 0817   HCO3 22.4 06/07/2022 0817   TCO2 24 06/07/2022 0817   ACIDBASEDEF 2.0 06/07/2022 0817  O2SAT 97 06/07/2022 0817     Coagulation Profile: Recent Labs  Lab 06/05/22 2025  INR 1.0    Cardiac Enzymes: Recent Labs  Lab 06/15/2022 2023  CKTOTAL 259    HbA1C: Hgb A1c MFr Bld  Date/Time Value Ref Range Status  03/15/2021 05:00 AM 5.3 4.8 - 5.6 % Final    Comment:    (NOTE) Pre diabetes:          5.7%-6.4%  Diabetes:              >6.4%  Glycemic control for   <7.0% adults with diabetes     CBG: Recent Labs  Lab 06/07/22 1517 06/07/22 1952 06/07/22 2335 06/08/22 0319 06/08/22 0737  GLUCAP 114* 112* 121* 101* 114*    Review of Systems:     Past Medical History:  He,  has a past medical history of GI (gastrointestinal bleed) and Neuropathy.   Surgical History:   Past Surgical History:  Procedure Laterality Date   BUBBLE STUDY  08/02/2020   Procedure: BUBBLE STUDY;  Surgeon: Quintella Reichert, MD;  Location: [facility] ENDOSCOPY;  Service: Cardiovascular;;   IR US GUIDE BX ASP/DRAIN  07/30/2020   TEE WITHOUT CARDIOVERSION N/A 08/02/2020   Procedure: TRANSESOPHAGEAL ECHOCARDIOGRAM (TEE);  Surgeon: Quintella Reichert, MD;  Location: [facility] ENDOSCOPY;  Service: Cardiovascular;  Laterality: N/A;   TONSILLECTOMY       Social History:   reports that he has been smoking cigarettes. He has never used smokeless tobacco. He reports current alcohol use. He reports current drug use. Drugs: Marijuana and Heroin.   Family History:  His family history is not on file.   Allergies No Known Allergies   Home Medications  Prior to Admission medications   Not on File    The patient is critically ill with multiple organ systems failure and requires high complexity decision making for assessment and support, frequent evaluation and titration of therapies, application of advanced monitoring technologies and extensive interpretation of multiple databases. Critical Care Time devoted to patient care services described in this note independent of APP/resident time (if applicable)  is 31  minutes.   Virl Diamond MD [facility] Personal pager: See Amion If unanswered, please page CCM On-call: [phone]

## 2022-06-08 NOTE — Progress Notes (Signed)
LTM EEG discontinued by nursing staff- EEG noted mild skin breakdown at unhook, Fp2, A1, A2.

## 2022-06-08 NOTE — Progress Notes (Signed)
Contacted by Verne Carrow  Patient's father -Telephone [phone]  I did update him about Williard's situation  He will be in to visit  He can be contacted with updates  He agrees to have his number placed on the record as the person to be contacted

## 2022-06-08 NOTE — Progress Notes (Signed)
eLink Physician-Brief Progress Note Patient Name: KHUSH PASION DOB: 09/26/1980 MRN: [id number]   Date of Service  06/08/2022  HPI/Events of Note  RN reports pt on TTM, now shivering.  No orders for buspar  eICU Interventions  Buspar prn ordered     Intervention Category Intermediate Interventions: Other:  Darl Pikes 06/08/2022, 2:21 AM

## 2022-06-08 NOTE — Progress Notes (Signed)
Neurology Progress Note Brief HPI: ARGELIO GRANIER is a 41 y.o. male who was with friends when he snorted a line of an white substance (UDS+ cocaine) and then had a witnessed cardiac arrest.  Fire department arrived 4 to 5 minutes after the call and started CPR he was then PEA then went into V-fib and was shocked with ROSC is noted at [place], CPR time was 15 minutes, downtime at least 20 minutes by report. After arrival to [facility] ED, he was noted to have jerking movements.   Interval History: LTM EEG negative for seizures. Now off propofol. Still on fentanyl for vent dyssynchrony. On exam, he is GCS3t w/no response. No jerking/twitching seen on assessment today. LTM EEG discontinued.  Currently off sedation, on Fentanyl gtt only. No change in neuro exam off sedation.   Exam: Current vital signs: BP (!) 161/82   Pulse (!) 114   Temp (!) 96.3 F (35.7 C)   Resp (!) 21   Wt 60 kg   SpO2 99%   BMI 19.53 kg/m  Vital signs in last 24 hours: Temp:  [95.9 F (35.5 C)-98.8 F (37.1 C)] 96.3 F (35.7 C) (12/21 1000) Pulse Rate:  [95-117] 114 (12/21 1000) Resp:  [16-28] 21 (12/21 1000) BP: (113-188)/(63-99) 161/82 (12/21 1000) SpO2:  [94 %-100 %] 99 % (12/21 1000) FiO2 (%):  [40 %] 40 % (12/21 0816)  Physical Exam  In bed, intubated, unresponsive.  Neuro: Mental Status: GCS3t: Does not open eyes or follow commands  Cranial Nerves: Pupils: 65mm, equal round and sluggish reaction to light. Cough: absent Gag: absent Corneal: absent No response to noxious stimuli in any of the extremities.  I have reviewed the labs and images obtained: CT head - no definite finding  Impression: 41 year old male with UDS+ cocaine who arrived to [facility] ED for witnessed cardiac arrest after snorting cocaine. Neurology consulted for post cardiac arrest jerking. 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  No change in neuro exam without sedation. Exam  concerning for anoxic brain injury given no brainstem reflexes noted, even off of sedation. Recommend MRI for prognostication.    Recommendations/Plan: Supportive care Discontinue LTM EEG  Continue Keppra 500mg  BID. MRI brain when able d/t no improvement for prognostication Neurology will follow, available for GOC/family meetings as needed   Pt seen by Neuro NP/APP and later by MD. Note/plan to be edited by MD as needed.    , DNP, AGACNP-BC [facility] Please use AMION for pager and EPIC for messaging  NEUROHOSPITALIST ADDENDUM Performed a face to face diagnostic evaluation.   I have reviewed the contents of history and physical exam as documented by PA/ARNP/Resident and agree with above documentation.  I have discussed and formulated the above plan as documented. Edits to the note have been made as needed.  Impression/Key exam findings/Plan: 41 year old male with cardiac arrest and post cardiac arrest jerking which resolved with Keppra and propofol. LTM with suppression pattern which persists despite completely off propofol. He is requiring Fentanyl to tolerate vent.  He is off LTM EEG. Exam with pupils 40mm and equal round and reactive to light, cough absent, gag absent, corneals weakly positive. No responsve to noxious stimuli in any extremities.  MRI Brain with diffuse DWI abdnomality and T2/FLAI hyperintensity  in bL hemispheres and deep gray nuclei. This is consistent with severe anoxic injury.  PROGNOSIS IS GRIM. We will be available as needed to assist with goals of care  conversations.  Erick Blinks, MD [facility] [phone]   If 7pm to 7am, please call on call as listed on AMION.

## 2022-06-08 NOTE — Progress Notes (Signed)
Called and spoke with patient's grandmother--Shirley Valentina Lucks [phone]  She did provide me with a phone number or call the grandson Cathrine Muster -[phone] this went to a generic voicemail -Left a message for Ivin Booty to call back  Jaidan has not been around his grandmother for well The last time they made contact, he was using fentanyl at the time  Has not been around his family because of his drug use  Not doing well, anoxic brain injury

## 2022-06-08 NOTE — Progress Notes (Signed)
Received a call from Cathrine Muster  He is Adolf's cousin  He will reach out to the family members and be in touch  I did explain to him about random significant brain injury which we feel is related to cocaine use and anoxic brain damage

## 2022-06-08 NOTE — Progress Notes (Signed)
Patient was transported to MRI & back to room 3M08 on the ventilator with no problems.

## 2022-06-08 NOTE — Progress Notes (Addendum)
NAME:  George Summers, MRN:  [id number], DOB:  1981/02/12, LOS: 2 ADMISSION DATE:  2022-06-18, CONSULTATION DATE:  12/18 REFERRING MD:  EDP, CHIEF COMPLAINT:  Cardiac arrest   History of Present Illness:  Admitted 12/18 with cardiac arrest. Became unresponsive after snorting an unknown substance at the time of arrest. Upon fire arrival he was in PEA. Did have VF during the total downtime of 20 mins. 15 mins ACLS done. Myoclonus vs SZ observed. Admitted to ICU on EEG. CT head neg.    Pertinent  Medical History   has a past medical history of GI (gastrointestinal bleed) and Neuropathy.   Significant Hospital Events: Including procedures, antibiotic start and stop dates in addition to other pertinent events   12/18 admit 12/19 c/o sz. Propfol increased by neurology.  12/20 increasing lactate, bolused fluids overnight 12/21 Added buspar for shivering  Interim History / Subjective:  Temperature 96.8 CVP 9 Per Neurology EEG is flat, would like to repeat MRI today  Objective   Blood pressure (!) 170/82, pulse (!) 113, temperature (!) 96.8 F (36 C), temperature source Bladder, resp. rate 18, weight 60 kg, SpO2 99 %. CVP:  [4 mmHg-12 mmHg] 11 mmHg  Vent Mode: PSV;CPAP FiO2 (%):  [40 %] 40 % Set Rate:  [26 bmp] 26 bmp Vt Set:  [560 mL] 560 mL PEEP:  [5 cmH20] 5 cmH20 Pressure Support:  [5 cmH20] 5 cmH20 Plateau Pressure:  [17 cmH20-18 cmH20] 17 cmH20   Intake/Output Summary (Last 24 hours) at 06/08/2022 0853 Last data filed at 06/08/2022 0800 Gross per 24 hour  Intake 4785.79 ml  Output 2050 ml  Net 2735.79 ml   Filed Weights   06/18/2022 1000  Weight: 60 kg    Physical Examination: General: Acutely ill-appearing male, lying in bed in NAD. Intubated and Sedated HEENT: [place]/AT, anicteric sclera, PERRL, moist mucous membranes. ETT in place, EEG Neuro: Sedated. Does not respond to verbal, tactile or noxious stimuli. Not following commands.  Not moving extremities, No  cough/gag/corneal reflex.  CV: RRR, no m/g/r. PULM: Breathing even and unlabored on MV. Lung fields Clear/Diminished. GI: Soft, nontender, nondistended. Normoactive bowel sounds. Extremities: N LE edema noted. Skin: Warm/dry, intact.  Resolved Hospital Problem list     Assessment & Plan:   Note: Patient was admitted under inocrrect name and MRN for first 24 hours so charting from that time period may be incomplete.   Cardiac arrest PEA, V-fib  -20 minutes downtime, 15 minutes of ACLS -Post snorting unknown substance, UDS positive for cocaine -Will stop TTM today, will add Tylenol PRN for fever management  -Echocardiogram shows 35 to 40%  -Goal MAP 65 -Tight glucose control  Encephalopathy Postcardiac arrest Myoclonus -On continuous EEG -Burst suppression -Continue Keppra - Added buspar PRN for shivering  -Appreciate neurology follow-up - MRI today   Heart failure with decreased ejection fraction Following cardiac arrest -Continue to monitor -CVP 9 -This is likely related to substance use -Ejection fraction down from 60-65% to 35 to 40%  Acute hypoxemic respiratory failure -Continue full ventilator support -CT abdomen showing bibasilar atelectasis -Empiric Zosyn-will discontinue vancomycin -Continue mechanical ventilation  -Target TVol 6-8cc/kgIBW -Target Plateau Pressure < 30cm H20 -Target driving pressure less than 15 cm of water -Ventilator associated pneumonia prevention protocol - Excessive secretions > adding scopolamine patch    Pneumonia with bibasal atelectasis -May be related to aspiration -MRSA PCR negative -Discontinue vancomycin  Agitation management -Off propofol -Off fentanyl  Acute kidney injury -Nonoliguric -Continue hemodynamic support -  Maintain renal perfusion -Avoid nephrotoxic agents  Transaminitis -Likely related to shock liver -Continue to monitor > repeating CMP today   Seizure versus myoclonus -Appreciate neurology  follow-up -EEG ongoing -Burst suppression with propofol -On Keppra  Lactic acidosis -Now Normalized  -No evidence of bowel infarction  Elevated troponin Substance abuse-cocaine -Continue supportive measures  Substance abuse - cessation when appropriate - continue  -Nutrition -Continue tube feeds  Best Practice (right click and "Reselect all SmartList Selections" daily)   Diet/type: tubefeeds DVT prophylaxis: LMWH GI prophylaxis: H2B Lines: N/A Foley:  Yes, and it is still needed Code Status:  full code Last date of multidisciplinary goals of care discussion [Attempted to call grandmother 12/19, left message]    Royston Bake, NP-S I have reviewed the above pt w/ Mr Randie Heinz  Exam  General remains unresponsive on vent  Pulm coarse scattered rhonchi asymmetric inspiratory effort Card rrr Abd soft Ext warm  Neuro comatose. No cough or gag  Principal Problem:   Overdose Active Problems:   Cardiac arrest (HCC)   Cocaine abuse (HCC)   Elevated troponin   Systolic heart failure (HCC)   Cardiogenic shock (HCC)   Acute respiratory failure with hypoxia (HCC)   Acute metabolic encephalopathy   Myoclonus   Aspiration pneumonia (HCC)   Lactic acidosis   AKI (acute kidney injury) (HCC)   Malnutrition of moderate degree  41 yom s/p cardiac arrest 2/2 cocaine overdose. C/b aspiration PNA, post arrest CM w/ cardiogenic shock and prolonged down time w/ myoclonus noted. Clinically concerned about anoxic brain injury. Also noting asymmetric resp efforts on PSV raising concern for flail chest which could be a problem of positive pressure.   Plan Cont full vent support Ok to cycle PSV but not candidate for extubation Repeat cxr am  LTM per neuro MRI today  Cont keppra Cont abx (zosyn) KVO IVFs Rest per note above   My time 32 min  Simonne Martinet ACNP-BC [facility] Pulmonary/Critical Care Pager # [phone] OR # [phone] if no answer

## 2022-06-08 NOTE — Procedures (Signed)
Patient Name: George Summers  MRN: [id number]  Epilepsy Attending: Charlsie Quest  Referring Physician/Provider: [name], MD  Duration: 06/07/2022 0254 to 06/08/2022 2706   Patient history: 41 year old male status post cardiac arrest.  EEG to evaluate for seizure.   Level of alertness:  comatose   AEDs during EEG study: Keppra, versed   Technical aspects: This EEG study was done with scalp electrodes positioned according to the 10-20 International system of electrode placement. Electrical activity was reviewed with band pass filter of 1-70Hz , sensitivity of 7 uV/mm, display speed of 24mm/sec with a 60Hz  notched filter applied as appropriate. EEG data were recorded continuously and digitally stored.  Video monitoring was available and reviewed as appropriate.   Description: EEG showed continuous generalized background suppression which was not reactive to tactile stimulation. Hyperventilation and photic stimulation were not performed.      ABNORMALITY - Background suppression, generalized   IMPRESSION: This study is suggestive of profound diffuse encephalopathy, nonspecific to etiology.  No seizures or epileptiform discharges were seen throughout the recording.   [name] 

## 2022-06-08 NOTE — Progress Notes (Signed)
LTM maint complete - no skin breakdown [facility] monitored, Event button test confirmed by [facility]. ? ?

## 2022-06-09 DIAGNOSIS — Z7189 Other specified counseling: Secondary | ICD-10-CM

## 2022-06-09 DIAGNOSIS — I469 Cardiac arrest, cause unspecified: Secondary | ICD-10-CM | POA: Diagnosis not present

## 2022-06-09 DIAGNOSIS — Z515 Encounter for palliative care: Secondary | ICD-10-CM | POA: Diagnosis not present

## 2022-06-09 LAB — CALCIUM, IONIZED: Calcium, Ionized, Serum: 4.5 mg/dL (ref 4.5–5.6)

## 2022-06-09 LAB — BASIC METABOLIC PANEL
Anion gap: 8 (ref 5–15)
BUN: 44 mg/dL — ABNORMAL HIGH (ref 6–20)
CO2: 22 mmol/L (ref 22–32)
Calcium: 8 mg/dL — ABNORMAL LOW (ref 8.9–10.3)
Chloride: 117 mmol/L — ABNORMAL HIGH (ref 98–111)
Creatinine, Ser: 1.93 mg/dL — ABNORMAL HIGH (ref 0.61–1.24)
GFR, Estimated: 45 mL/min — ABNORMAL LOW (ref >60)
Glucose, Bld: 125 mg/dL — ABNORMAL HIGH (ref 70–99)
Potassium: 3.6 mmol/L (ref 3.5–5.1)
Sodium: 147 mmol/L — ABNORMAL HIGH (ref 135–145)

## 2022-06-09 LAB — CBC
HCT: 32 % — ABNORMAL LOW (ref 39.0–52.0)
Hemoglobin: 11.1 g/dL — ABNORMAL LOW (ref 13.0–17.0)
MCH: 34.7 pg — ABNORMAL HIGH (ref 26.0–34.0)
MCHC: 34.7 g/dL (ref 30.0–36.0)
MCV: 100 fL (ref 80.0–100.0)
Platelets: 135 10*3/uL — ABNORMAL LOW (ref 150–400)
RBC: 3.2 MIL/uL — ABNORMAL LOW (ref 4.22–5.81)
RDW: 15.7 % — ABNORMAL HIGH (ref 11.5–15.5)
WBC: 9.2 10*3/uL (ref 4.0–10.5)
nRBC: 0 % (ref 0.0–0.2)

## 2022-06-09 LAB — MAGNESIUM: Magnesium: 2.7 mg/dL — ABNORMAL HIGH (ref 1.7–2.4)

## 2022-06-09 LAB — GLUCOSE, CAPILLARY
Glucose-Capillary: 152 mg/dL — ABNORMAL HIGH (ref 70–99)
Glucose-Capillary: 114 mg/dL — ABNORMAL HIGH (ref 70–99)
Glucose-Capillary: 129 mg/dL — ABNORMAL HIGH (ref 70–99)

## 2022-06-09 LAB — PHOSPHORUS: Phosphorus: 2.7 mg/dL (ref 2.5–4.6)

## 2022-06-09 MED ORDER — ACETAMINOPHEN 650 MG RE SUPP: 650.0000 mg | Freq: Four times a day (QID) | RECTAL | Status: DC | PRN

## 2022-06-09 MED ORDER — ONDANSETRON 4 MG PO TBDP: 4.0000 mg | ORAL_TABLET | Freq: Four times a day (QID) | ORAL | Status: DC | PRN

## 2022-06-09 MED ORDER — HYDRALAZINE HCL 25 MG PO TABS
25.0000 mg | ORAL_TABLET | Freq: Four times a day (QID) | ORAL | Status: DC
  Administered 2022-06-09: 25 mg
  Filled 2022-06-09: qty 1

## 2022-06-09 MED ORDER — BIOTENE DRY MOUTH MT LIQD: 15.0000 mL | OROMUCOSAL | Status: DC | PRN

## 2022-06-09 MED ORDER — MIDAZOLAM HCL 2 MG/2ML IJ SOLN
INTRAMUSCULAR | Status: AC
  Filled 2022-06-09: qty 2

## 2022-06-09 MED ORDER — MIDAZOLAM HCL 2 MG/2ML IJ SOLN
2.0000 mg | Freq: Once | INTRAMUSCULAR | Status: AC
  Administered 2022-06-09: 2 mg via INTRAVENOUS

## 2022-06-09 MED ORDER — DOCUSATE SODIUM 50 MG/5ML PO LIQD: 100.0000 mg | Freq: Two times a day (BID) | ORAL | Status: DC | PRN

## 2022-06-09 MED ORDER — POLYETHYLENE GLYCOL 3350 17 G PO PACK: 17.0000 g | PACK | Freq: Every day | ORAL | Status: DC | PRN

## 2022-06-09 MED ORDER — ONDANSETRON HCL 4 MG/2ML IJ SOLN: 4.0000 mg | Freq: Four times a day (QID) | INTRAMUSCULAR | Status: DC | PRN

## 2022-06-09 MED ORDER — HYDROMORPHONE HCL-NACL 50-0.9 MG/50ML-% IV SOLN: 2.0000 mg/h | INTRAVENOUS | Status: DC

## 2022-06-09 MED ORDER — POLYVINYL ALCOHOL 1.4 % OP SOLN: 1.0000 [drp] | Freq: Four times a day (QID) | OPHTHALMIC | Status: DC | PRN

## 2022-06-09 MED ORDER — HYDRALAZINE HCL 20 MG/ML IJ SOLN
20.0000 mg | Freq: Once | INTRAMUSCULAR | Status: AC
  Administered 2022-06-09: 20 mg via INTRAVENOUS
  Filled 2022-06-09: qty 1

## 2022-06-09 MED ORDER — HYDRALAZINE HCL 50 MG PO TABS: 50.0000 mg | ORAL_TABLET | Freq: Four times a day (QID) | ORAL | Status: DC

## 2022-06-09 MED ORDER — HYDRALAZINE HCL 20 MG/ML IJ SOLN
20.0000 mg | Freq: Four times a day (QID) | INTRAMUSCULAR | Status: DC | PRN
  Administered 2022-06-09: 20 mg via INTRAVENOUS
  Filled 2022-06-09 (×2): qty 1

## 2022-06-09 MED ORDER — IPRATROPIUM-ALBUTEROL 0.5-2.5 (3) MG/3ML IN SOLN: 3.0000 mL | Freq: Four times a day (QID) | RESPIRATORY_TRACT | Status: DC | PRN

## 2022-06-09 MED ORDER — MIDAZOLAM BOLUS VIA INFUSION: 2.0000 mg | INTRAVENOUS | Status: DC | PRN

## 2022-06-09 MED ORDER — GLYCOPYRROLATE 0.2 MG/ML IJ SOLN
0.4000 mg | INTRAMUSCULAR | Status: DC
  Administered 2022-06-09: 0.4 mg via INTRAVENOUS
  Filled 2022-06-09: qty 2

## 2022-06-09 MED ORDER — MIDAZOLAM-SODIUM CHLORIDE 100-0.9 MG/100ML-% IV SOLN
2.0000 mg/h | INTRAVENOUS | Status: DC
  Administered 2022-06-09: 2 mg/h via INTRAVENOUS
  Filled 2022-06-09: qty 100

## 2022-06-09 MED ORDER — LABETALOL HCL 5 MG/ML IV SOLN
20.0000 mg | INTRAVENOUS | Status: DC | PRN
  Administered 2022-06-09: 20 mg via INTRAVENOUS
  Filled 2022-06-09: qty 4

## 2022-06-09 MED ORDER — HYDROMORPHONE BOLUS VIA INFUSION: 2.0000 mg | INTRAVENOUS | Status: DC | PRN

## 2022-06-09 MED ORDER — ACETAMINOPHEN 325 MG PO TABS: 650.0000 mg | ORAL_TABLET | Freq: Four times a day (QID) | ORAL | Status: DC | PRN

## 2022-06-10 LAB — CALCIUM, IONIZED: Calcium, Ionized, Serum: 4.9 mg/dL (ref 4.5–5.6)

## 2022-06-11 LAB — CULTURE, BLOOD (ROUTINE X 2)
Culture: NO GROWTH
Culture: NO GROWTH
Special Requests: ADEQUATE
Special Requests: ADEQUATE

## 2022-06-19 NOTE — Progress Notes (Signed)
  Progress Note   Date: 06/08/2022  Patient Name: George Summers        MRN#: [id number]  Clarification of diagnosis:  Acute systolic CHF

## 2022-06-19 NOTE — Progress Notes (Signed)
NAME:  George Summers, MRN:  [id number], DOB:  03-23-1981, LOS: 3 ADMISSION DATE:  06/02/2022, CONSULTATION DATE:  12/18 REFERRING MD:  EDP, CHIEF COMPLAINT:  Cardiac arrest   History of Present Illness:  Admitted 12/18 with cardiac arrest. Became unresponsive after snorting an unknown substance at the time of arrest. Upon fire arrival he was in PEA. Did have VF during the total downtime of 20 mins. 15 mins ACLS done. Myoclonus vs SZ observed. Admitted to ICU on EEG. CT head neg.    Pertinent  Medical History   has a past medical history of GI (gastrointestinal bleed) and Neuropathy.   Significant Hospital Events: Including procedures, antibiotic start and stop dates in addition to other pertinent events   12/18 admit 12/19 c/o sz. Propfol increased by neurology.  12/20 increasing lactate, bolused fluids overnight 12/21 Added buspar for shivering  12/22 MRI > Anoxic Brain Damage  Interim History / Subjective:  HTN/Tachycardic  CVP 9 MRI > Anoxic Brain Damage  Plan to meet with family today, PMT consulted Objective   Blood pressure (!) 188/109, pulse (!) 125, temperature (!) 100.4 F (38 C), temperature source Bladder, resp. rate (!) 25, weight 68.3 kg, SpO2 97 %. CVP:  [7 mmHg-21 mmHg] 13 mmHg  Vent Mode: PRVC FiO2 (%):  [40 %] 40 % Set Rate:  [26 bmp] 26 bmp Vt Set:  [560 mL] 560 mL PEEP:  [5 cmH20] 5 cmH20 Plateau Pressure:  [11 cmH20-16 cmH20] 11 cmH20   Intake/Output Summary (Last 24 hours) at 07/03/22 0819 Last data filed at 07/03/22 0753 Gross per 24 hour  Intake 2253 ml  Output 1965 ml  Net 288 ml   Filed Weights   06/16/2022 1000 Jul 03, 2022 0500  Weight: 60 kg 68.3 kg    Physical Examination: General: Acutely ill-appearing male in NAD. Intubated HEENT: [place]/AT, anicteric sclera, PERRL, moist mucous membranes. ETT and OG in place Neuro: Sedated. Does not respond to verbal, tactile or noxious stimuli. Not following commands.  Strength 0/5 in all extremities.  No cough/gag/corneal reflex.  CV: RRR, no m/g/r. PULM: Breathing even and unlabored on MV. Lung fields clear/diminished. GI: Soft, nontender, nondistended. Normoactive bowel sounds. Extremities: no LE edema noted. Skin: Warm/dry, inact.  Resolved Hospital Problem list     Assessment & Plan:   Note: Patient was admitted under inocrrect name and MRN for first 24 hours so charting from that time period may be incomplete.   Cardiac arrest PEA, V-fib  -20 minutes downtime, 15 minutes of ACLS -Post snorting unknown substance, UDS positive for cocaine -Tylenol PRN for fever management  -Echocardiogram shows 35 to 40%  -Goal MAP 65 -Tight glucose control  Encephalopathy Postcardiac arrest Myoclonus -EEG completed > no seizures  -Continue Keppra - Added buspar PRN for shivering  -Appreciate neurology follow-up - MRI > anoxic brain damage  - PMT consulted   Heart failure with decreased ejection fraction Following cardiac arrest -Continue to monitor -CVP 13 -This is likely related to substance use -Ejection fraction down from 60-65% to 35 to 40%  Acute hypoxemic respiratory failure -Continue full ventilator support > now in PSV -CT abdomen showing bibasilar atelectasis -Empiric Zosyn-will discontinue vancomycin -Continue mechanical ventilation  -Target TVol 6-8cc/kgIBW -Target Plateau Pressure < 30cm H20 -Target driving pressure less than 15 cm of water -Ventilator associated pneumonia prevention protocol - Excessive secretions > cont scopolamine patch    Pneumonia with bibasal atelectasis -May be related to aspiration -MRSA PCR negative -Discontinue vancomycin  Agitation management -Off  propofol -On fentanyl  Acute kidney injury -Nonoliguric -Continue hemodynamic support -Maintain renal perfusion -Avoid nephrotoxic agents  Transaminitis -Likely related to shock liver -Continue to monitor    Seizure versus myoclonus -Appreciate neurology follow-up -EEG  d/c -Burst suppression with propofol -On Keppra  Lactic acidosis -Now Normalized  -No evidence of bowel infarction  Elevated troponin Substance abuse-cocaine -Continue supportive measures  Substance abuse - cessation when appropriate - continue  -Nutrition -Continue tube feeds  Hypertension - Hydralazine now scheduled  - Adding Labetalol PRN   Best Practice (right click and "Reselect all SmartList Selections" daily)   Diet/type: tubefeeds DVT prophylaxis: LMWH GI prophylaxis: H2B Lines: N/A Foley:  Yes, and it is still needed Code Status:  full code Last date of multidisciplinary goals of care discussion  12/21 - Family updated by Dr. Morene Antu with plan to visit 12/22. PMT consulted    Jerimah Witucki Randie Heinz, NP-S Cct deferred to Attending  [facility] Pulmonary/Critical Care Pager # [phone] OR # [phone] if no answer

## 2022-06-19 NOTE — Procedures (Signed)
Extubation Procedure Note  Patient Details:   Name: George Summers DOB: May 02, 1981 MRN: [id number]   Airway Documentation:    Vent end date: 05/29/2022 Vent end time: 1701   Evaluation  O2 sats: stable throughout Complications: No apparent complications Patient did tolerate procedure well. Bilateral Breath Sounds: Diminished   No Pt was successfully extubated to comfort care with no apparent complications. RT will monitor as needed Jolayne Panther 06/14/2022, 5:03 PM

## 2022-06-19 NOTE — Progress Notes (Signed)
eLink Physician-Brief Progress Note Patient Name: George Summers DOB: [birth year] MRN: [id number]   Date of Service  05/19/2022  HPI/Events of Note  Notified of hypertension with BP 238/133, HR 127.  Pt remains intubated and sedated.  Pt is on fentanyl gtt.  utox positive for cocaine and THC.   eICU Interventions  Hydralazine IV ordered.     Intervention Category Intermediate Interventions: Hypertension - evaluation and management  Larinda Buttery 05/19/2022, 6:56 AM

## 2022-06-19 NOTE — Death Summary Note (Signed)
DEATH SUMMARY   Patient Details  Name: George Summers MRN: [id number] DOB: [birth year]  Admission/Discharge Information   Admit Date:  2022-07-03  Date of Death: Date of Death: 06-Jul-2022  Time of Death: Time of Death: 11/13/1713  Length of Stay: 3  Referring Physician: Pcp, No   Reason(s) for Hospitalization  Patient admitted to the hospital following an out of hospital cardiac arrest  Diagnoses  Preliminary cause of death:  Cocaine abuse Anoxic brain injury  Secondary Diagnoses (including complications and co-morbidities):  Principal Problem:   Overdose Active Problems:   Cardiac arrest (HCC)   Acute respiratory failure with hypoxia (HCC)   Cocaine abuse (HCC)   Elevated troponin   Lactic acidosis   Systolic heart failure (HCC)   Malnutrition of moderate degree   Cardiogenic shock (HCC)   Acute metabolic encephalopathy   Myoclonus   Aspiration pneumonia (HCC)   AKI (acute kidney injury) [facility])   Brief Hospital Course (including significant findings, care, treatment, and services provided and events leading to death)  George Summers is a 42 y.o. year old male who sustained a cardiac arrest following snorting an unknown substance, substance turned out to be cocaine Was found in PEA by emergency services, had ventricular fibrillation noted during resuscitative efforts, downtime of 20 minutes with about 50 minutes of ACLS.  Myoclonus noted on admission Normothermia protocol initiated Testing positive for cocaine Normothermia was maintained, followed with labs which showed acute kidney injury, Cardiac injury, lactic acidosis. MRI of the brain was conducted and showed significant anoxic injury. On exam had no gag, no corneals, no response to noxious stimuli, was breathing above the vent Was able to track down family members and goals of care discussions was initiated Was able to track down patient's dad and with the assistance of palliative care medicine patient was  transitioned to comfort measures only  He succumbed to his illness on 2022-07-06 at 1715 hrs.  Anoxic brain injury secondary to cocaine abuse   Pertinent Labs and Studies  Significant Diagnostic Studies MR BRAIN WO CONTRAST  Result Date: 06/08/2022 CLINICAL DATA:  Provided history: Mental status change, unknown cause. PEA arrest. EXAM: MRI HEAD WITHOUT CONTRAST TECHNIQUE: Multiplanar, multiecho pulse sequences of the brain and surrounding structures were obtained without intravenous contrast. COMPARISON:  Head CT 07/03/2013. FINDINGS: Mild intermittent motion degradation. Brain: There is diffuse diffusion-weighted and T2 FLAIR hyperintense signal abnormality within the bilateral cerebral cortex, deep gray nuclei and hippocampi. Extensive diffusion-weighted and T2 FLAIR hyperintense signal abnormality is also present within the cerebellum. These findings are compatible with acute hypoxic/ischemic injury. There is associated cerebral and cerebellar edema, and the basal cisterns are partly effaced. Punctate focus of T2* signal loss within the high right parietal lobe, which may reflect a chronic microhemorrhage or focus of mineralization (series 7, image 22). No evidence of an intracranial mass. No extra-axial fluid collection. No midline shift. Vascular: Maintained flow voids within the proximal large arterial vessels. Skull and upper cervical spine: No suspicious marrow lesion. Sinuses/Orbits: No mass or acute finding within the imaged orbits. Pansinusitis which is overall moderate to severe. Other: Bilateral mastoid effusions. Impression #1 will be called to the ordering clinician or representative by the Radiologist Assistant, and communication documented in the PACS or [employer]. IMPRESSION: 1. There is diffuse diffusion-weighted and T2 FLAIR hyperintense signal abnormality within the bilateral cerebral cortex, deep gray nuclei and hippocampi. Extensive diffusion-weighted and T2 FLAIR  hyperintense signal abnormality is also present within the cerebellum. These findings are  compatible with acute hypoxic/ischemic injury. There is associated cerebral and cerebellar edema, and the basal cisterns are partly effaced. 2. Pansinusitis. 3. Bilateral mastoid effusions. Electronically Signed   By: Jackey Loge D.O.   On: 06/08/2022 14:09   Overnight EEG with video  Result Date: 06/07/2022 George Quest, MD     June 22, 2022  6:42 AM Patient Name: George Summers MRN: [id number] Epilepsy Attending: [name] Referring Physician/Provider: [name], MD Duration: 06/15/2022 0752 to 06/07/2022 1013 Patient history: 42 year old male status post cardiac arrest.  EEG to evaluate for seizure. Level of alertness:  comatose AEDs during EEG study: Keppra, propofol Technical aspects: This EEG study was done with scalp electrodes positioned according to the 10-20 International system of electrode placement. Electrical activity was reviewed with band pass filter of 1-70Hz , sensitivity of 7 uV/mm, display speed of 34mm/sec with a  notched filter applied as appropriate. EEG data were recorded continuously and digitally stored.  Video monitoring was available and reviewed as appropriate. Description: EEG showed continuous generalized background suppression which was not reactive to tactile stimulation. Hyperventilation and photic stimulation were not performed.   Respiratory artifact was noted intermittently throughout the study. ABNORMALITY - Background suppression, generalized IMPRESSION: This study is suggestive of profound diffuse encephalopathy, nonspecific to etiology.  No seizures or epileptiform discharges were seen throughout the recording. George Summers   DG CHEST PORT 1 VIEW  Result Date: 06/07/2022 CLINICAL DATA:  [id number] Encounter for central line placement [id number] EXAM: PORTABLE CHEST 1 VIEW COMPARISON:  CT chest 05/05/2008, chest x-ray 05/07/2022 FINDINGS: Endotracheal tube  5 cm above the carina. Left internal jugular central venous catheter with tip overlying the superior cavoatrial junction. Enteric tube coursing below the hemidiaphragm with side for lying the expected region the gastric lumen and tip collimated off view. Indeterminate 2.2 cm linear density overlying the descending thoracic aorta again noted related to an external tube The heart and mediastinal contours are unchanged. No focal consolidation. No pulmonary edema. No pleural effusion. No pneumothorax. No acute osseous abnormality. IMPRESSION: 1. No active disease. 2. Lines and tubes in good position as above. Electronically Signed   By: Tish Frederickson M.D.   On: 06/07/2022 01:44   CT ABDOMEN PELVIS WO CONTRAST  Result Date: 06/07/2022 CLINICAL DATA:  increasing lactic acid level EXAM: CT ABDOMEN AND PELVIS WITHOUT CONTRAST TECHNIQUE: Multidetector CT imaging of the abdomen and pelvis was performed following the standard protocol without IV contrast. RADIATION DOSE REDUCTION: This exam was performed according to the departmental dose-optimization program which includes automated exposure control, adjustment of the mA and/or kV according to patient size and/or use of iterative reconstruction technique. COMPARISON:  CT abdomen pelvis 03/05/2021 FINDINGS: Lower chest: Left lower lobe consolidation with air bronchograms. Patchy airspace opacity of the right lower lobe. Hepatobiliary: No focal liver abnormality. No gallstones, gallbladder wall thickening, or pericholecystic fluid. No biliary dilatation. Pancreas: Hazy distal body and tail pancreatic contour with query underlying edema (3:20-23). No main pancreatic ductal dilatation. Spleen: Normal in size without focal abnormality. Adrenals/Urinary Tract: No adrenal nodule bilaterally. No nephrolithiasis and no hydronephrosis. No definite contour-deforming renal mass. No ureterolithiasis or hydroureter. The urinary bladder is decompressed with Foley catheter tip and  balloon terminating within the urinary bladder lumen. Stomach/Bowel: Enteric tube with tip and side port within the gastric lumen. Stomach is within normal limits. No evidence of bowel wall thickening or dilatation. Appendix appears normal. Vascular/Lymphatic: No portal venous gas or mesenteric venous gas. No abdominal aorta or  iliac aneurysm. No abdominal, pelvic, or inguinal lymphadenopathy. Reproductive: Prostate is unremarkable. Other: Mesenteric fat stranding and high density free fluid within the mid abdomen (3:45). No intraperitoneal free gas. No organized fluid collection. Musculoskeletal: No chest wall abnormality. No suspicious lytic or blastic osseous lesions. No acute displaced fracture. Grade 1 anterolisthesis of L4 on L5 with bilateral pars interarticularis defects. IMPRESSION: 1. Findings suggestive of hemorrhagic pancreatitis. Correlate with lipase levels. 2. Bilateral lower lobe airspace opacity suggestive of infection/inflammation. These results will be called to the ordering clinician or representative by the Radiologist Assistant, and communication documented in the PACS or [employer]. Electronically Signed   By: Tish Frederickson M.D.   On: 06/07/2022 01:03   ECHOCARDIOGRAM COMPLETE  Result Date: 06/11/2022    ECHOCARDIOGRAM REPORT   Patient Name:   George Summers [facility] Date of Exam: 06/15/2022 Medical Rec #:  [id number]       Height:       69.0 in Accession #:    [id number]      Weight:       132.3 lb Date of Birth:  04/24/1981       BSA:          1.733 m Patient Age:    41 years        BP:           103/77 mmHg Patient Gender: M               HR:           110 bpm. Exam Location:  Inpatient Procedure: 2D Echo, Cardiac Doppler, Color Doppler and Intracardiac            Opacification Agent Indications:    Cardiac arrest  History:        Patient has prior history of Echocardiogram examinations, most                 recent 03/08/2021. Polysubstance abuse.  Sonographer:    Ross Ludwig RDCS (AE)  Referring Phys: [id number] Clarene Critchley [facility]  Sonographer Comments: Echo performed with patient supine and on artificial respirator. IMPRESSIONS  1. Left ventricular ejection fraction, by estimation, is 35 to 40%. The left ventricle has moderately decreased function. The left ventricle demonstrates global hypokinesis. Indeterminate diastolic filling due to E-A fusion.  2. Right ventricular systolic function is mildly reduced. The right ventricular size is normal. Tricuspid regurgitation signal is inadequate for assessing PA pressure.  3. The mitral valve is grossly normal. No evidence of mitral valve regurgitation. No evidence of mitral stenosis.  4. The aortic valve was not well visualized. Aortic valve regurgitation is not visualized. No aortic stenosis is present. Comparison(s): Changes from prior study are noted. The left ventricular function is worsened. FINDINGS  Left Ventricle: Left ventricular ejection fraction, by estimation, is 35 to 40%. The left ventricle has moderately decreased function. The left ventricle demonstrates global hypokinesis. Definity contrast agent was given IV to delineate the left ventricular endocardial borders. The left ventricular internal cavity size was normal in size. There is no left ventricular hypertrophy. Indeterminate diastolic filling due to E-A fusion. Right Ventricle: The right ventricular size is normal. No increase in right ventricular wall thickness. Right ventricular systolic function is mildly reduced. Tricuspid regurgitation signal is inadequate for assessing PA pressure. Left Atrium: Left atrial size was normal in size. Right Atrium: Right atrial size was normal in size. Pericardium: There is no evidence of pericardial effusion. Mitral Valve: The mitral valve is grossly normal.  No evidence of mitral valve regurgitation. No evidence of mitral valve stenosis. Tricuspid Valve: The tricuspid valve is not well visualized. Tricuspid valve regurgitation is trivial. No evidence of  tricuspid stenosis. Aortic Valve: The aortic valve was not well visualized. Aortic valve regurgitation is not visualized. No aortic stenosis is present. Aortic valve mean gradient measures 2.0 mmHg. Aortic valve peak gradient measures 3.0 mmHg. Aortic valve area, by VTI measures 2.67 cm. Pulmonic Valve: The pulmonic valve was not well visualized. Pulmonic valve regurgitation is not visualized. No evidence of pulmonic stenosis. Aorta: The aortic root is normal in size and structure. Venous: IVC assessment for right atrial pressure unable to be performed due to mechanical ventilation. IAS/Shunts: The atrial septum is grossly normal. Additional Comments: A venous catheter is visualized in the right atrium.  LEFT VENTRICLE PLAX 2D LVIDd:         4.10 cm LVIDs:         3.20 cm LV PW:         0.70 cm LV IVS:        0.60 cm LVOT diam:     2.10 cm LV SV:         32 LV SV Index:   18 LVOT Area:     3.46 cm  LV Volumes (MOD) LV vol d, MOD A4C: 52.4 ml LV vol s, MOD A4C: 34.8 ml LV SV MOD A4C:     52.4 ml RIGHT VENTRICLE RV Basal diam:  2.30 cm RV S prime:     7.62 cm/s TAPSE (M-mode): 1.5 cm LEFT ATRIUM           Index       RIGHT ATRIUM          Index LA diam:      2.00 cm 1.15 cm/m  RA Area:     9.05 cm LA Vol (A2C): 16.8 ml 9.69 ml/m  RA Volume:   17.20 ml 9.92 ml/m LA Vol (A4C): 13.4 ml 7.73 ml/m  AORTIC VALVE AV Area (Vmax):    2.94 cm AV Area (Vmean):   2.66 cm AV Area (VTI):     2.67 cm AV Vmax:           86.90 cm/s AV Vmean:          59.900 cm/s AV VTI:            0.119 m AV Peak Grad:      3.0 mmHg AV Mean Grad:      2.0 mmHg LVOT Vmax:         73.70 cm/s LVOT Vmean:        46.000 cm/s LVOT VTI:          0.092 m LVOT/AV VTI ratio: 0.77  AORTA Ao Root diam: 2.90 cm  SHUNTS Systemic VTI:  0.09 m Systemic Diam: 2.10 cm Lennie OdorWesley O'Neal MD Electronically signed by Lennie OdorWesley O'Neal MD Signature Date/Time: 16-Jun-2022/5:13:55 PM    Final    DG Chest 1 View  Result Date: 16-Jun-2022 CLINICAL DATA:  Intubated EXAM:  CHEST  1 VIEW COMPARISON:  03/05/2021 FINDINGS: Lungs are clear. No pneumothorax or pleural effusion. Normal pulmonary vasculature. Endotracheal tube tip 1 cm below thoracic inlet. NG tube tip below the diaphragm and off x-ray. IMPRESSION: Lungs are clear. Electronically Signed   By: Layla MawJoshua  Pleasure M.D.   On: 16-Jun-2022 12:57   CT HEAD WO CONTRAST  Result Date: 06/05/2022 CLINICAL DATA:  Found unresponsive after snorting substances. EXAM: CT HEAD WITHOUT  CONTRAST TECHNIQUE: Contiguous axial images were obtained from the base of the skull through the vertex without intravenous contrast. RADIATION DOSE REDUCTION: This exam was performed according to the departmental dose-optimization program which includes automated exposure control, adjustment of the mA and/or kV according to patient size and/or use of iterative reconstruction technique. COMPARISON:  None Available. FINDINGS: Brain: There is no acute intracranial hemorrhage, extra-axial fluid collection, or acute infarct. Parenchymal volume is normal. The ventricles are normal in size. Gray-white differentiation appears preserved, without convincing evidence of anoxic brain injury There is no solid mass lesion. There is no mass effect or midline shift. Vascular: No hyperdense vessel or unexpected calcification. Skull: Choose Sinuses/Orbits: There is fluid in the sphenoid sinuses which may be related to instrumentation. The globes and orbits are unremarkable. Other: There is prominent periapical lucency around the imaged maxillary teeth. IMPRESSION: 1. No evidence of acute intracranial pathology. No convincing evidence of anoxic brain injury. 2. Extensive dental disease involving the imaged maxillary teeth. Electronically Signed   By: Lesia Hausen M.D.   On: 06/05/2022 21:15   DG Abdomen 1 View  Result Date: 06/05/2022 CLINICAL DATA:  Orogastric tube EXAM: ABDOMEN - 1 VIEW COMPARISON:  None Available. FINDINGS: Orogastric tube tip at the level of the mid  stomach. No dilated bowel loops are seen. IMPRESSION: Orogastric tube tip at the level of the mid stomach. Electronically Signed   By: Darliss Cheney M.D.   On: 06/05/2022 20:55   DG Chest Port 1 View  Result Date: 06/05/2022 CLINICAL DATA:  Intubated, OG tube. EXAM: PORTABLE CHEST 1 VIEW COMPARISON:  Chest x-ray 03/21/2021 FINDINGS: Endotracheal tube tip is 4.5 cm above the carina. Enteric tube extends into the stomach, but distal tip is not included on the image. There is some minimal patchy opacities in the medial left lung base. The lungs are otherwise clear. No pleural effusion or pneumothorax. Cardiomediastinal silhouette is within normal limits. No acute fractures are seen. Are clear. The visualized skeletal structures are unremarkable. IMPRESSION: 1. Endotracheal tube tip is 4.5 cm above the carina. 2. Enteric tube extends into the stomach, but distal tip is not included on the image. 3. Minimal patchy opacities in the medial left lung base, possibly atelectasis or pneumonia. Electronically Signed   By: Darliss Cheney M.D.   On: 06/05/2022 20:54    Microbiology Recent Results (from the past 240 hour(s))  MRSA Next Gen by PCR, Nasal     Status: None   Collection Time: 05/28/2022  3:56 AM   Specimen: Nasal Mucosa; Nasal Swab  Result Value Ref Range Status   MRSA by PCR Next Gen NOT DETECTED NOT DETECTED Final    Comment: (NOTE) The GeneXpert MRSA Assay (FDA approved for NASAL specimens only), is one component of a comprehensive MRSA colonization surveillance program. It is not intended to diagnose MRSA infection nor to guide or monitor treatment for MRSA infections. Test performance is not FDA approved in patients less than 31 years old. Performed at [facility], [address], [place], [place] [address]   Culture, blood (Routine X 2) w Reflex to ID Panel     Status: None (Preliminary result)   Collection Time: 06/05/2022  1:15 PM   Specimen: BLOOD RIGHT HAND  Result Value Ref Range  Status   Specimen Description BLOOD RIGHT HAND  Final   Special Requests   Final    BOTTLES DRAWN AEROBIC AND ANAEROBIC Blood Culture adequate volume   Culture   Final  NO GROWTH 3 DAYS Performed at [facility], [address] [place], [place] [address]    Report Status PENDING  Incomplete  Culture, blood (Routine X 2) w Reflex to ID Panel     Status: None (Preliminary result)   Collection Time: 06/05/2022  3:27 PM   Specimen: BLOOD RIGHT ARM  Result Value Ref Range Status   Specimen Description BLOOD RIGHT ARM  Final   Special Requests AEROBIC BOTTLE ONLY Blood Culture adequate volume  Final   Culture   Final    NO GROWTH 3 DAYS Performed at [facility], [address], [place], [place] [address]    Report Status PENDING  Incomplete    Lab Basic Metabolic Panel: Recent Labs  Lab 06/05/22 2025 06/05/22 2109 05/19/2022 1238 06/17/2022 1726 05/26/2022 2023 06/07/22 0224 06/07/22 0527 06/07/22 0817 06/07/22 1007 06/07/22 1636 06/08/22 0427 06/08/22 1055 06/17/2022 0456  NA 138   < >  --  140   < > 139  --  138  --  140 140 140 147*  K 3.8   < >  --  6.1*   < > 3.7   < > 4.1 3.7 4.0 4.1 3.7 3.6  CL 100  --   --  103   < > 103  --   --   --  110 112* 113* 117*  CO2 19*  --   --  20*   < > 22  --   --   --  22 23 23 22   GLUCOSE 327*  --   --  121*   < > 152*  --   --   --  111* 107* 133* 125*  BUN 9  --   --  29*   < > 44*  --   --   --  44* 45* 43* 44*  CREATININE 1.25*   < >  --  2.46*   < > 2.97*  --   --   --  2.91* 2.64* 2.57* 1.93*  CALCIUM 8.4*  --   --  7.6*   < > 7.3*  --   --   --  7.1* 7.6* 7.6* 8.0*  MG 2.6*  --   --  1.5*  --  2.3  --   --   --   --  3.0*  --  2.7*  PHOS 11.4*  --  4.9*  --   --  4.6  --   --   --   --  4.3  --  2.7   < > = values in this interval not displayed.   Liver Function Tests: Recent Labs  Lab 06/05/22 2025 05/29/2022 1726 06/08/22 1055  AST 414* 428* 558*  ALT 135* 164* 186*  ALKPHOS 164* 121 102  BILITOT 0.4 0.6 0.6   PROT 6.3* 6.3* 5.1*  ALBUMIN 3.5 3.2* 2.2*   Recent Labs  Lab 06/07/22 0224  LIPASE 61*   No results for input(s): "AMMONIA" in the last 168 hours. CBC: Recent Labs  Lab 06/05/22 2320 05/26/2022 0002 05/26/2022 1238 06/07/22 0224 06/07/22 0817 06/08/22 0427 06/17/2022 0456  WBC 13.4*  --  27.0* 19.1*  --  13.5* 9.2  HGB 19.4*   < > 18.8* 14.4 11.9* 11.4* 11.1*  HCT 57.1*   < > 54.6* 41.5 35.0* 33.0* 32.0*  MCV 99.3  --  98.7 97.6  --  99.7 100.0  PLT 203  --  166 127*  --  112* 135*   < > = values in this interval not displayed.   Cardiac Enzymes: Recent Labs  Lab 24-Jun-2022 2023  CKTOTAL 259   Sepsis Labs: Recent Labs  Lab 06/05/22 2025 06/05/22 2320 06/24/2022 1238 06-24-2022 1508 06/24/22 1845 Jun 24, 2022 2142 06/07/22 0224 06/07/22 0830 06/08/22 0427 06/01/2022 0456  PROCALCITON 0.10  --   --  53.33  --   --  52.75  --  30.35  --   WBC 8.6   < > 27.0*  --   --   --  19.1*  --  13.5* 9.2  LATICACIDVEN 8.6*   < > 3.7* 4.5* 2.8* 7.3*  --  1.8  --   --    < > = values in this interval not displayed.    Procedures/Operations    Errika Narvaiz A Vilma Will 06/07/2022, 5:57 PM

## 2022-06-19 NOTE — TOC Progression Note (Signed)
Transition of Care [facility]) - Initial/Assessment Note    Patient Details  Name: George Summers MRN: [id number] Date of Birth: 28-Mar-1981  Transition of Care [facility]) CM/SW Contact:    Ralene Bathe, LCSWA Phone Number: 06/12/2022, 1:38 PM  Clinical Narrative:                 Transition of Care Department [facility]) has reviewed patient.  Patient is currently intubated and unresponsive.  We will continue to monitor patient advancement through interdisciplinary progression rounds. If new patient transition needs arise, please place a TOC consult.      Patient Goals and CMS Choice            Expected Discharge Plan and Services                                              Prior Living Arrangements/Services                       Activities of Daily Living      Permission Sought/Granted                  Emotional Assessment              Admission diagnosis:  Overdose [T50.901A] Patient Active Problem List   Diagnosis Date Noted   Cardiogenic shock (HCC) 06/08/2022   Acute metabolic encephalopathy 06/08/2022   Myoclonus 06/08/2022   Aspiration pneumonia (HCC) 06/08/2022   AKI (acute kidney injury) (HCC) 06/08/2022   Systolic heart failure (HCC) 06/07/2022   Overdose 06/07/2022   Malnutrition of moderate degree 06/07/2022   Cardiac arrest (HCC) 20-Jun-2022   Acute respiratory failure with hypoxia (HCC) 20-Jun-2022   Cocaine abuse (HCC) 06-20-2022   Elevated troponin 2022-06-20   Lactic acidosis 06-20-22   Hepatitis C test positive 03/25/2021   Bimalleolar ankle fracture, left, sequela 03/25/2021   Open wound of left lower extremity 03/15/2021   Cellulitis 03/15/2021   MSSA bacteremia 07/30/2020   Paraspinal abscess (HCC) 07/27/2020   Acute midline low back pain    Epidural abscess    Heroin withdrawal (HCC)    PCP:  Pcp, No Pharmacy:   [id number] - [place], [place] - [address] AT [facility] [address] [place] [address] Phone: [phone] Fax: [phone]  [facility] [address] [place] [address] Phone: [phone] Fax: [phone]  [id number] [place], [place] - [address] AT [facility] OF [facility] [address] [place] [address] Phone: [phone] Fax: [phone]     Social Determinants of Health (SDOH) Social History: SDOH Screenings   Depression (PHQ2-9): Medium Risk (08/17/2020)  Tobacco Use: High Risk (03/30/2021)   SDOH Interventions:     Readmission Risk Interventions     No data to display

## 2022-06-19 NOTE — Progress Notes (Signed)
eLink Physician-Brief Progress Note Patient Name: George Summers DOB: [birth year] MRN: [id number]   Date of Service  06/10/22  HPI/Events of Note  Received request for flexiseal for watery diarrhea.   eICU Interventions  Rectal tube ordered.     Intervention Category Minor Interventions: Other:  Larinda Buttery June 10, 2022, 4:23 AM

## 2022-06-19 NOTE — Progress Notes (Signed)
   Palliative Medicine Inpatient Follow Up Note   Went by patients room this evening in the setting of worsening symptom burden. Patient noted to have passed away upon entering room. Provided education and support to patients friend, George Summers at bedside.   No Charge ______________________________________________________________________________________ George Summers Palliative Medicine Team Team Cell Phone: [phone] Please utilize secure chat with additional questions, if there is no response within 30 minutes please call the above phone number  Palliative Medicine Team providers are available by phone from 7am to 7pm daily and can be reached through the team cell phone.  Should this patient require assistance outside of these hours, please call the patient's attending physician.

## 2022-06-19 NOTE — Progress Notes (Signed)
This chaplain responded to PMT-NP [facility] consult for EOL spiritual care.  The chaplain is updated by the Pt. RN-Ashley before the visit. The Pt. friend-Jason is at the bedside.  The chaplain listened reflectively as Barbara Cower shared stories about his relationship with the Pt. The chaplain offered grief support as needed.  The chaplain is appreciative of the RN's communication with Barbara Cower as Barbara Cower determines his role as a friend at the bedside.  The chaplain understands the PMT and RN are working together in planning a time for compassionate extubation.  Chaplain Stephanie Acre [phone]

## 2022-06-19 NOTE — Consult Note (Signed)
Palliative Medicine Inpatient Consult Note  Consulting Provider: Simonne Martinet, NP   Reason for consult:   Palliative Care Consult Services Palliative Medicine Consult  Reason for Consult? goals of care   05/30/2022  HPI:  Per intake H&P --> Admitted 12/18 with cardiac arrest. Became unresponsive after snorting an unknown substance at the time of arrest. Upon fire arrival he was in PEA. Did have VF during the total downtime of 20 mins. 15 mins ACLS done. Myoclonus vs SZ observed. Admitted to ICU on EEG. MRI w/ diffuse anoxic injury. Palliative care consulted for goals of care conversations.   Clinical Assessment/Goals of Care:  *Please note that this is a verbal dictation therefore any spelling or grammatical errors are due to the "[employer]agon [employer] One" system interpretation.  I have reviewed medical records including EPIC notes, labs and imaging, received report from bedside RN, assessed the patient who is intubated and sedated.    I called patients father, Chrissie Noa to further discuss diagnosis prognosis, GOC, EOL wishes, disposition and options.   I introduced Palliative Medicine as specialized medical care for people living with serious illness. It focuses on providing relief from the symptoms and stress of a serious illness. The goal is to improve quality of life for both the patient and the family.  Medical History Review and Understanding:  Discussed Khyree's long standing history of drug abuse.   Social History:  Yosgart is from [place] originally. He has never been married nor had children. He has a father who is living and is his primary point of contact. His mother and sister have passed away sadly. He worked for a short time after high school though has battled drug abuse for the past 20 (+) years. He does have faith and is a member of [employer].   Functional and Nutritional State:  Prior to hospitalization, Yolanda has been out of prison for roughly four  months. He was living with his friend Barbara Cower and had apparently been on a better path. He was asked to help "score drugs" for a friend and as a result of this used himself.   He has been homeless for a period of time, lived with his father, and lived with friends. He is identified as a Sports coach".   Advance Directives:  A detailed discussion was had today regarding advanced directives.  Patients only living relative at this time is his father who is his next of kin.   Code Status:  Concepts specific to code status, artifical feeding and hydration, continued IV antibiotics and rehospitalization was had.  The difference between a aggressive medical intervention path  and a palliative comfort care path for this patient at this time was had.   Encouraged patients father to consider DNR/DNI status understanding evidenced based poor outcomes in similar hospitalized patient, as the cause of arrest is likely associated with advanced chronic/terminal illness rather than an easily reversible acute cardio-pulmonary event. I explained that DNR/DNI does not change the medical plan and it only comes into effect after a person has arrested (died).  It is a protective measure to keep [place] from harming the patient in their last moments of life. Chrissie Noa was agreeable to DNR/DNI with understanding that patient would not receive CPR, defibrillation, ACLS medications, or intubation.   Discussion:  Discussed patients MRI with Chrissie Noa. I shared that Bonny is not going to be the person he once knew ever again sadly. We reviewed the effect anoxic injury has on a person and the long term  outlooks in terms of physical function. I shared very openly that we either continue with present measures which sadly would just be supporting his body's shell or we continue with keeping Remigio comfortable.   Patients father shared that this was very difficult for him. He experienced his son suffering for quite sometime. He states that he  wants him to be at peace.  We talked about transition to comfort measures in house and what that would entail inclusive of medications to control pain, dyspnea, agitation, nausea, itching, and hiccups.   We discussed stopping all uneccessary measures such as mechanical ventilation, artificial nutrition, cardiac monitoring, blood draws, needle sticks, and frequent vital signs. Utilized reflective listening throughout our time together.  Patients father expresses that he is not able to be present  Discussed the importance of continued conversation with family and their  medical providers regarding overall plan of care and treatment options, ensuring decisions are within the context of the patients values and GOCs.  Decision Maker: Grieger,William (Father): [phone] (Mobile)   SUMMARY OF RECOMMENDATIONS   DNAR  Plan for compassionate extubation this afternoon  Fentanyl and Versed gtts to support symptoms  Additional comfort medications per [facility]  Anticipate in hospital death  Ongoing Palliative Support  Code Status/Advance Care Planning: DNAR  Palliative Prophylaxis:  Aspiration, Bowel Regimen, Delirium Protocol, Frequent Pain Assessment, Oral Care, Palliative Wound Care, and Turn Reposition  Additional Recommendations (Limitations, Scope, Preferences): Continue current care  Psycho-social/Spiritual:  Desire for further Chaplaincy support: Yes Additional Recommendations: Education on end of life care   Prognosis: Limited hours to days at the most  Discharge Planning: Discharge will be celestial  Vitals:   26-Jun-2022 1400 06/26/2022 1430  BP: (!) 178/99 (!) 179/94  Pulse: (!) 125 (!) 130  Resp: 16 17  Temp: 100 F (37.8 C) 100 F (37.8 C)  SpO2: 98% 97%    Intake/Output Summary (Last 24 hours) at 06/26/2022 1504 Last data filed at Jun 26, 2022 1500 Gross per 24 hour  Intake 1836.75 ml  Output 2390 ml  Net -553.25 ml   Last Weight  Most recent update: June 26, 2022  5:02  AM    Weight  68.3 kg (150 lb 9.2 oz)            Gen:  Caucasian M acutley ill in appearance HEENT: ETT, OGT, dry mucous membranes CV: irregular rate and rhythm  PULM: On mechanical vent ABD: soft/nontender  EXT: Generalized edema  Neuro: Somnolent  PPS: 10%   This conversation/these recommendations were discussed with patient primary care team, Dr. Wynona Neat & Anders Simmonds  Billing based on MDM: High  Problems Addressed: One acute or chronic illness or injury that poses a threat to life or bodily function  Amount and/or Complexity of Data: Category 3:Discussion of management or test interpretation with external physician/other qualified health care professional/appropriate source (not separately reported)  Risks: Parenteral controlled substances, Decision regarding hospitalization or escalation of hospital care, and Decision not to resuscitate or to de-escalate care because of poor prognosis ______________________________________________________ Lamarr Lulas [place] Palliative Medicine Team Team Cell Phone: [phone] Please utilize secure chat with additional questions, if there is no response within 30 minutes please call the above phone number  Palliative Medicine Team providers are available by phone from 7am to 7pm daily and can be reached through the team cell phone.  Should this patient require assistance outside of these hours, please call the patient's attending physician.

## 2022-06-19 DEATH — deceased

## 2023-06-06 ENCOUNTER — Other Ambulatory Visit (HOSPITAL_COMMUNITY): Payer: Self-pay
